# Patient Record
Sex: Female | Born: 1935
Health system: Southern US, Community
[De-identification: ages and names within clinical notes are randomized; demographics above are authoritative.]

## PROBLEM LIST (undated history)

## (undated) DIAGNOSIS — F329 Major depressive disorder, single episode, unspecified: Secondary | ICD-10-CM

## (undated) DIAGNOSIS — M810 Age-related osteoporosis without current pathological fracture: Secondary | ICD-10-CM

## (undated) DIAGNOSIS — K219 Gastro-esophageal reflux disease without esophagitis: Secondary | ICD-10-CM

## (undated) DIAGNOSIS — I10 Essential (primary) hypertension: Secondary | ICD-10-CM

## (undated) DIAGNOSIS — I499 Cardiac arrhythmia, unspecified: Secondary | ICD-10-CM

## (undated) DIAGNOSIS — M1990 Unspecified osteoarthritis, unspecified site: Secondary | ICD-10-CM

## (undated) DIAGNOSIS — F32A Depression, unspecified: Secondary | ICD-10-CM

## (undated) DIAGNOSIS — K5790 Diverticulosis of intestine, part unspecified, without perforation or abscess without bleeding: Secondary | ICD-10-CM

## (undated) DIAGNOSIS — E785 Hyperlipidemia, unspecified: Secondary | ICD-10-CM

## (undated) DIAGNOSIS — I350 Nonrheumatic aortic (valve) stenosis: Secondary | ICD-10-CM

## (undated) DIAGNOSIS — J449 Chronic obstructive pulmonary disease, unspecified: Secondary | ICD-10-CM

## (undated) DIAGNOSIS — R011 Cardiac murmur, unspecified: Secondary | ICD-10-CM

## (undated) HISTORY — DX: Gastro-esophageal reflux disease without esophagitis: K21.9

## (undated) HISTORY — DX: Chronic obstructive pulmonary disease, unspecified: J44.9

## (undated) HISTORY — DX: Depression, unspecified: F32.A

## (undated) HISTORY — PX: CATARACT EXTRACTION: SUR2

## (undated) HISTORY — DX: Hyperlipidemia, unspecified: E78.5

## (undated) HISTORY — PX: APPENDECTOMY: SHX54

## (undated) HISTORY — DX: Major depressive disorder, single episode, unspecified: F32.9

## (undated) HISTORY — PX: ABDOMINAL HYSTERECTOMY: SHX81

## (undated) HISTORY — DX: Diverticulosis of intestine, part unspecified, without perforation or abscess without bleeding: K57.90

## (undated) HISTORY — DX: Essential (primary) hypertension: I10

---
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
## 1998-09-09 ENCOUNTER — Other Ambulatory Visit: Admission: RE | Admit: 1998-09-09 | Discharge: 1998-09-09 | Payer: Self-pay | Admitting: *Deleted

## 1999-09-19 ENCOUNTER — Other Ambulatory Visit: Admission: RE | Admit: 1999-09-19 | Discharge: 1999-09-19 | Payer: Self-pay | Admitting: *Deleted

## 2000-05-09 ENCOUNTER — Encounter: Payer: Self-pay | Admitting: Gastroenterology

## 2007-11-05 ENCOUNTER — Ambulatory Visit: Payer: Self-pay | Admitting: Gastroenterology

## 2007-11-05 DIAGNOSIS — R1033 Periumbilical pain: Secondary | ICD-10-CM | POA: Insufficient documentation

## 2007-11-05 LAB — CONVERTED CEMR LAB
AST: 18 units/L (ref 0–37)
Albumin: 3.9 g/dL (ref 3.5–5.2)
Alkaline Phosphatase: 70 units/L (ref 39–117)
BUN: 18 mg/dL (ref 6–23)
Basophils Relative: 1.2 % — ABNORMAL HIGH (ref 0.0–1.0)
Creatinine, Ser: 0.8 mg/dL (ref 0.4–1.2)
GFR calc Af Amer: 90 mL/min
GFR calc non Af Amer: 75 mL/min
Hemoglobin: 13.5 g/dL (ref 12.0–15.0)
Monocytes Absolute: 0.6 10*3/uL (ref 0.2–0.7)
Monocytes Relative: 7.4 % (ref 3.0–11.0)
RDW: 13 % (ref 11.5–14.6)
Total Bilirubin: 0.9 mg/dL (ref 0.3–1.2)
Total Protein: 6.7 g/dL (ref 6.0–8.3)

## 2008-11-24 ENCOUNTER — Encounter: Admission: RE | Admit: 2008-11-24 | Discharge: 2009-01-06 | Payer: Self-pay | Admitting: Family Medicine

## 2010-05-18 ENCOUNTER — Encounter: Payer: Self-pay | Admitting: Gastroenterology

## 2010-09-22 ENCOUNTER — Encounter (INDEPENDENT_AMBULATORY_CARE_PROVIDER_SITE_OTHER): Payer: Self-pay | Admitting: *Deleted

## 2010-09-29 NOTE — Letter (Signed)
Summary: New Patient letter  [facility]  [address]   [place], [place] [address]   Phone: [phone]  Fax: [phone]       09/22/2010 MRN: [id number]  Robin [name] [address] [place], [place]  [address]  Dear Ms. MYERS,  Welcome to the Gastroenterology Division at [facility].    You are scheduled to see Dr.  Hennie Duos on 10/28/2010 at 3:00 on the 3rd floor at [facility], [address]  We ask that you try to arrive at our office 15 minutes prior to your appointment time to allow for check-in.  We would like you to complete the enclosed self-administered evaluation form prior to your visit and bring it with you on the day of your appointment.  We will review it with you.  Also, please bring a complete list of all your medications or, if you prefer, bring the medication bottles and we will list them.  Please bring your insurance card so that we may make a copy of it.  If your insurance requires a referral to see a specialist, please bring your referral form from your primary care physician.  Co-payments are due at the time of your visit and may be paid by cash, check or credit card.     Your office visit will consist of a consult with your physician (includes a physical exam), any laboratory testing he/she may order, scheduling of any necessary diagnostic testing (e.g. x-ray, ultrasound, CT-scan), and scheduling of a procedure (e.g. Endoscopy, Colonoscopy) if required.  Please allow enough time on your schedule to allow for any/all of these possibilities.    If you cannot keep your appointment, please call [phone] to cancel or reschedule prior to your appointment date.  This allows [place] the opportunity to schedule an appointment for another patient in need of care.  If you do not cancel or reschedule by 5 p.m. the business day prior to your appointment date, you will be charged a $50.00 late cancellation/no-show fee.    Thank you for choosing  [facility] for your medical needs.  We appreciate the opportunity to care for you.  Please visit [place] at our website  to learn more about our practice.                     Sincerely,                                                             The Gastroenterology Division

## 2010-09-29 NOTE — Letter (Signed)
Summary: Colonoscopy Letter  [facility]  [address] [place], [place] [address]   Phone: [phone]  Fax: [phone]      May 18, 2010 MRN: [id number]   Robin [name] [address] [place], [place]  [address]   Dear Robin [name],   According to your medical record, it is time for you to schedule a Colonoscopy. The American Cancer Society recommends this procedure as a method to detect early colon cancer. Patients with a family history of colon cancer, or a personal history of colon polyps or inflammatory bowel disease are at increased risk.  This letter has beeen generated based on the recommendations made at the time of your procedure. If you feel that in your particular situation this may no longer apply, please contact our office.  Please call our office at [phone] to schedule this appointment or to update your records at your earliest convenience.  Thank you for cooperating with [place] to provide you with the very best care possible.   Sincerely,  Barbette Hair. Arlyce Dice, M.D.  [facility] Division [phone]

## 2010-10-28 ENCOUNTER — Encounter: Payer: Self-pay | Admitting: Gastroenterology

## 2010-10-28 ENCOUNTER — Ambulatory Visit (INDEPENDENT_AMBULATORY_CARE_PROVIDER_SITE_OTHER): Payer: Medicare Other | Admitting: Gastroenterology

## 2010-10-28 DIAGNOSIS — R197 Diarrhea, unspecified: Secondary | ICD-10-CM

## 2010-10-28 DIAGNOSIS — R194 Change in bowel habit: Secondary | ICD-10-CM | POA: Insufficient documentation

## 2010-10-28 DIAGNOSIS — R198 Other specified symptoms and signs involving the digestive system and abdomen: Secondary | ICD-10-CM

## 2010-10-28 DIAGNOSIS — Z8719 Personal history of other diseases of the digestive system: Secondary | ICD-10-CM | POA: Insufficient documentation

## 2010-10-31 ENCOUNTER — Other Ambulatory Visit: Payer: Medicare Other | Admitting: Gastroenterology

## 2010-10-31 ENCOUNTER — Ambulatory Visit (HOSPITAL_COMMUNITY)
Admission: RE | Admit: 2010-10-31 | Discharge: 2010-10-31 | Disposition: A | Discharge status: Home / Self Care | Payer: Medicare Other | Source: Ambulatory Visit | Attending: Gastroenterology | Admitting: Gastroenterology

## 2010-10-31 ENCOUNTER — Encounter: Payer: Self-pay | Admitting: Gastroenterology

## 2010-10-31 DIAGNOSIS — K648 Other hemorrhoids: Secondary | ICD-10-CM

## 2010-10-31 DIAGNOSIS — K5730 Diverticulosis of large intestine without perforation or abscess without bleeding: Secondary | ICD-10-CM | POA: Insufficient documentation

## 2010-10-31 DIAGNOSIS — R198 Other specified symptoms and signs involving the digestive system and abdomen: Secondary | ICD-10-CM

## 2010-11-03 NOTE — Letter (Signed)
Summary: [facility] Instructions  [facility]  [address] [place], [place] [address]   Phone: [phone]  Fax: [phone]       Robin [name]    1932/06/21    MRN: [id number]        Procedure Day /Date:MONDAY 10/31/2010     Arrival Time:8:30AM     Procedure Time:9:30PM     Location of Procedure:                     X   [facility] ( Outpatient Registration)                        PREPARATION FOR COLONOSCOPY WITH MOVIPREP   Starting 5 days prior to your procedure3/08/2010 do not eat nuts, seeds, popcorn, corn, beans, peas,  salads, or any raw vegetables.  Do not take any fiber supplements (e.g. Metamucil, Citrucel, and Benefiber).  THE DAY BEFORE YOUR PROCEDURE         DATE: 10/30/2010  DAY: SUNDAY  1.  Drink clear liquids the entire day-NO SOLID FOOD  2.  Do not drink anything colored red or purple.  Avoid juices with pulp.  No orange juice.  3.  Drink at least 64 oz. (8 glasses) of fluid/clear liquids during the day to prevent dehydration and help the prep work efficiently.  CLEAR LIQUIDS INCLUDE: Water Jello Ice Popsicles Tea (sugar ok, no milk/cream) Powdered fruit flavored drinks Coffee (sugar ok, no milk/cream) Gatorade Juice: apple, white grape, white cranberry  Lemonade Clear bullion, consomm, broth Carbonated beverages (any kind) Strained chicken noodle soup Hard Candy                             4.  In the morning, mix first dose of MoviPrep solution:    Empty 1 Pouch A and 1 Pouch B into the disposable container    Add lukewarm drinking water to the top line of the container. Mix to dissolve    Refrigerate (mixed solution should be used within 24 hrs)  5.  Begin drinking the prep at 5:00 p.m. The MoviPrep container is divided by 4 marks.   Every 15 minutes drink the solution down to the next mark (approximately 8 oz) until the full liter is complete.   6.  Follow completed prep with 16 oz of clear liquid of your choice  (Nothing red or purple).  Continue to drink clear liquids until bedtime.  7.  Before going to bed, mix second dose of MoviPrep solution:    Empty 1 Pouch A and 1 Pouch B into the disposable container    Add lukewarm drinking water to the top line of the container. Mix to dissolve    Refrigerate  THE DAY OF YOUR PROCEDURE      DATE: 10/31/2010 DAY: MONDAY  Beginning at 4:30AM _a.m. (5 hours before procedure):         1. Every 15 minutes, drink the solution down to the next mark (approx 8 oz) until the full liter is complete.  2. Follow completed prep with 16 oz. of clear liquid of your choice.    3. You may drink clear liquids until 5:30AM 4  HOURS BEFORE PROCEDURE).   MEDICATION INSTRUCTIONS  Unless otherwise instructed, you should take regular prescription medications with a small sip of water   as early as possible the morning of your procedure.  OTHER INSTRUCTIONS  You will need a responsible adult at least 75 years of age to accompany you and drive you home.   This person must remain in the waiting room during your procedure.  Wear loose fitting clothing that is easily removed.  Leave jewelry and other valuables at home.  However, you may wish to bring a book to read or  an iPod/MP3 player to listen to music as you wait for your procedure to start.  Remove all body piercing jewelry and leave at home.  Total time from sign-in until discharge is approximately 2-3 hours.  You should go home directly after your procedure and rest.  You can resume normal activities the  day after your procedure.  The day of your procedure you should not:   Drive   Make legal decisions   Operate machinery   Drink alcohol   Return to work  You will receive specific instructions about eating, activities and medications before you leave.    The above instructions have been reviewed and explained to me by   _______________________    I fully understand and can  verbalize these instructions _____________________________ Date _________

## 2010-11-03 NOTE — Procedures (Signed)
Summary: Colonoscopy   Colonoscopy  Procedure date:  05/09/2000  Findings:      Results: Diverticulosis.       Location:  [facility].    Comments:      Repeat colonoscopy in 10 years.    Colonoscopy  Procedure date:  05/09/2000  Findings:      Results: Diverticulosis.       Location:  [facility].    Comments:      Repeat colonoscopy in 10 years.   Patient Name: Robin, [name] MRN: [id number] Procedure Procedures: Colorectal cancer screening, average risk CPT: G0121.  Personnel: Endoscopist: Barbette Hair. Arlyce Dice, MD.  Referred By: Al Decant Janey Greaser, MD.  Exam Location: Exam performed in [facility]. Outpatient  Patient Consent: Procedure, Alternatives, Risks and Benefits discussed, consent obtained, from patient.  Indications  Average Risk Screening Routine.  History  Pre-Exam Physical: Performed May 09, 2000. Cardio-pulmonary exam, Rectal exam, HEENT exam , Abdominal exam, Extremity exam, Neurological exam WNL.  Exam Exam: Extent of exam reached: Cecum, extent intended: Cecum.  Patient position: on left side. Colon retroflexion performed. ASA Classification: I. Tolerance: excellent.  Monitoring: Pulse and BP monitoring, Oximetry used. Supplemental O2 given.  Colon Prep Used Golytely for colon prep. Prep results: excellent.  Fluoroscopy: Fluoroscopy was not used.  Sedation Meds: Versed 10 mg. Fentanyl 100 mcg.  Findings - DIVERTICULOSIS: Descending Colon to Sigmoid Colon. Not bleeding.   Assessment Abnormal examination, see findings above.  Diagnoses: 562.10: Diverticulosis.   Events  Unplanned Interventions: No intervention was required.  Unplanned Events: There were no complications. Plans Patient Education: Patient given standard instructions for: Diverticulosis.  Disposition: After procedure patient sent to recovery. After recovery patient sent home.  Scheduling/Referral: Colonoscopy, to Barbette Hair. Arlyce Dice, MD, around  May 09, 2010.    This report was created from the original endoscopy report, which was reviewed and signed by the above listed endoscopist.

## 2010-11-08 NOTE — Miscellaneous (Signed)
  Clinical Lists Changes  Medications: Added new medication of ANUSOL-HC 25 MG  SUPP (HYDROCORTISONE ACETATE) 1 pr qhs - Signed Rx of ANUSOL-HC 25 MG  SUPP (HYDROCORTISONE ACETATE) 1 pr qhs;  #7 x 1;  Signed;  Entered by: Louis Meckel MD;  Authorized by: Louis Meckel MD;  Method used: Electronically to [facility] [address] [place], [place], [place]  [address], Ph: [phone], Fax: [phone]    Prescriptions: ANUSOL-HC 25 MG  SUPP (HYDROCORTISONE ACETATE) 1 pr qhs  #7 x 1   Entered and Authorized by:   Louis Meckel MD   Signed by:   Louis Meckel MD on 10/31/2010   Method used:   Electronically to        [facility]       [address]       [place], [place]  [address]       Ph: [phone]       Fax: [phone]   RxID:   [id number]

## 2010-11-08 NOTE — Letter (Signed)
Summary: Appt Reminder 2  [facility]  [address] [place], [place] [address]   Phone: [phone]  Fax: [phone]        October 31, 2010 MRN: [id number]    Robin [name] [address] [place], [place]  [address]    Dear Ms. MYERS,   You have a return appointment with Dr. Arlyce Dice on 12/07/10 at 2:15pm.  Please remember to bring a complete list of the medicines you are taking, your insurance card and your co-pay.  If you have to cancel or reschedule this appointment, please call before 5:00 pm the evening before to avoid a cancellation fee.  If you have any questions or concerns, please call [phone].    Sincerely,    Selinda Michaels RN  Appended Document: Appt Reminder 2 Letter is mailed to the patient's home address

## 2010-11-08 NOTE — Assessment & Plan Note (Signed)
Summary: CONSULT FOR COLON AT PT REQUEST.[name] POL ADVISED//MEDLIST/...   History of Present Illness Visit Type: Initial Visit Primary GI MD: Melvia Heaps MD [facility] Primary Provider: Sigmund Hazel, MD Chief Complaint: Patient complains of change in bowels, she also complain of fecal incontience. She also recieved a letter about her colonoscopy recall.  History of Present Illness:   [name]  is a pleasant, 77 year old white female referred at the request of Dr. Hyacinth Meeker for evaluation of change in bowel habits. For several months she  has developed poorly formed stools accompanied by severe urgency and occasional incontinence. She has some rectal discomfort due to hemorrhoids. She denies change in medications or her diet. She has been on no antibiotics. Last colonoscopy in 2001 demonstrated diverticulosis.  She may have  several of loose stools daily.   GI Review of Systems      Denies abdominal pain, acid reflux, belching, bloating, chest pain, dysphagia with liquids, dysphagia with solids, heartburn, loss of appetite, nausea, vomiting, vomiting blood, weight loss, and  weight gain.      Reports change in bowel habits, fecal incontinence, and  hemorrhoids.     Denies anal fissure, black tarry stools, constipation, diarrhea, diverticulosis, heme positive stool, irritable bowel syndrome, jaundice, light color stool, liver problems, rectal bleeding, and  rectal pain. Preventive Screening-Counseling & Management  Alcohol-Tobacco     Smoking Status: quit      Drug Use:  no.      Current Medications (verified): 1)  Sertraline Hcl 100 Mg Tabs (Sertraline Hcl) .... Take One By Mouth Once Daily 2)  Diovan Hct 320-12.5 Mg Tabs (Valsartan-Hydrochlorothiazide) .... Take One By Mouth Once Daily 3)  Spiriva Handihaler 18 Mcg Caps (Tiotropium Bromide Monohydrate) .... Use Once A Day 4)  Lovastatin 40 Mg Tabs (Lovastatin) .... Take One By Mouth Once Daily  Allergies (verified): 1)  ! Sulfa  Past  History:  Past Medical History: GERD Constipation Depression Diverticulosis TIA Hyperlipidemia Hypertension  Past Surgical History: Reviewed history from 10/24/2010 and no changes required. Appendectomy Hysterectomy  Family History: Sister had biliary cancer family history of lung cancer: father (smoker) No FH of Colon Cancer:  Social History: Occupation: retired married one son Patient is a former smoker.  Alcohol Use - yes 2 per day Daily Caffeine Use 3 per day Illicit Drug Use - no Smoking Status:  quit Drug Use:  no  Review of Systems       The patient complains of allergy/sinus and urine leakage.  The patient denies anemia, anxiety-new, arthritis/joint pain, back pain, blood in urine, breast changes/lumps, change in vision, confusion, cough, coughing up blood, depression-new, fainting, fatigue, fever, headaches-new, hearing problems, heart murmur, heart rhythm changes, itching, menstrual pain, muscle pains/cramps, night sweats, nosebleeds, pregnancy symptoms, shortness of breath, skin rash, sleeping problems, sore throat, swelling of feet/legs, swollen lymph glands, thirst - excessive , urination - excessive , urination changes/pain, vision changes, and voice change.         All other systems were reviewed and were negative   Vital Signs:  Patient profile:   77 year old female Height:      63 inches Weight:      157 pounds BMI:     27.91 Pulse rate:   76 / minute Pulse rhythm:   regular BP sitting:   138 / 62  (left arm) Cuff size:   regular  Vitals Entered By: Harlow Mares CMA Duncan Dull) (October 28, 2010 3:09 PM)  Physical Exam  Additional Exam:  Physical Exam: General:   WDWN HEENT:   anicteric.  No pharyngeal abnormalities Neck:   No masses, thyroidmegaly Nodes:   No cervical, axillary, inguinal adenopathy Chest:    Clear to auscultation Cardiac:   No murmurs, gallops, rubs Abdomen:   BS active.  No abd masses, tenderness, organomegaly Rectal:    Deferred Extremities:   No cyanosis, clubbing, edema Skeletal:   No deformities Neuro:   Alert, oriented x3.  No focal abnormalities     Impression & Recommendations:  Problem # 1:  CHANGE IN BOWELS (ICD-787.99)   There is no obvious etiology for her symptoms. A structural abnormality of the colon should be ruled out.   Recommendations #1 colonoscopy. #2 fiber supplementation if colonoscopy is not diagnostic for an abnormality  Orders: ZCOL (ZCOL)  Patient Instructions: 1)  Copy sent to :Sigmund Hazel, MD  2)  Your Colonoscopy is scheduled for Monday 10/31/2010 at 9:30pm 3)  You can pick up your MoviPrep at your pharmacy  4)  Colonoscopy and Flexible Sigmoidoscopy brochure given.  5)  Conscious Sedation brochure given.  6)  The medication list was reviewed and reconciled.  All changed / newly prescribed medications were explained.  A complete medication list was provided to the patient / caregiver. Prescriptions: MOVIPREP 100 GM  SOLR (PEG-KCL-NACL-NASULF-NA ASC-C) As per prep instructions.  #1 x 0   Entered by:   Merri Ray CMA (AAMA)   Authorized by:   Louis Meckel MD   Signed by:   Merri Ray CMA (AAMA) on 10/28/2010   Method used:   Electronically to        [facility]       [address]       [place], [place]  [address]       Ph: [phone]       Fax: [phone]   RxID:   [id number]

## 2010-11-08 NOTE — Procedures (Signed)
Summary: Colonoscopy  Patient: [name] Note: All result statuses are Final unless otherwise noted.  Tests: (1) Colonoscopy (COL)   COL Colonoscopy           DONE     [facility]     [address] [place], [place]  [address]          COLONOSCOPY PROCEDURE REPORT          PATIENT:  Robin [name], Robin [name]  MR#:  [id number]     BIRTHDATE:  February 05, 1932, 78 yrs. old  GENDER:  female     ENDOSCOPIST:  Barbette Hair. Arlyce Dice, MD     REF. BY:  Sigmund Hazel, M.D.     PROCEDURE DATE:  10/31/2010     PROCEDURE:  Diagnostic Colonoscopy     ASA CLASS:  Class II     INDICATIONS:  change in bowel habits     MEDICATIONS:   Fentanyl 75 mcg IV, Versed 7.5 mg IV, Benadryl 25     mg          DESCRIPTION OF PROCEDURE:   After the risks benefits and     alternatives of the procedure were thoroughly explained, informed     consent was obtained.  Digital rectal exam was performed and     revealed no abnormalities.   The Pentax Colonoscope [id number]     endoscope was introduced through the anus and advanced to the     cecum, which was identified by the ileocecal valve, without     limitations.  The quality of the prep was excellent, using     MoviPrep.  The instrument was then slowly withdrawn as the colon     was fully examined.     <<PROCEDUREIMAGES>>          FINDINGS:  Moderate diverticulosis was found in the sigmoid colon     (see image9).  Internal Hemorrhoids were found (see image11).     This was otherwise a normal examination of the colon (see image2,     image3, image4, image5, image7, and image10).   Retroflexed views     in the rectum revealed no abnormalities.    The scope was then     withdrawn from the patient and the procedure completed.          COMPLICATIONS:  None     ENDOSCOPIC IMPRESSION:     1) Moderate diverticulosis in the sigmoid colon     2) Internal hemorrhoids     3) Otherwise normal examination     RECOMMENDATIONS:     1) high fiber diet     2) Anusol HC  supp. prn     3) call office next 1-3 days to schedule followup visit in 1     month     REPEAT EXAM:  No          ______________________________     Barbette Hair. Arlyce Dice, MD          CC:          n.     eSIGNED:   Barbette Hair. Pietro Bonura at 10/31/2010 10:55 AM          Walker Shadow, [id number]  Note: An exclamation mark (!) indicates a result that was not dispersed into the flowsheet. Document Creation Date: 10/31/2010 10:56 AM _______________________________________________________________________  (1) Order result status: Final Collection or observation date-time: 10/31/2010 10:50 Requested date-time:  Receipt date-time:  Reported date-time:  Referring Physician:  Ordering Physician: Melvia Heaps [phone]) Specimen Source:  Source: Launa Grill Order Number: [id number] Lab site:

## 2010-11-08 NOTE — Procedures (Signed)
Summary: Instructions for procedure/[place]  Instructions for procedure/[place]   Imported By: Sherian Rein 11/02/2010 14:44:38  _____________________________________________________________________  External Attachment:    Type:   Image     Comment:   External Document

## 2010-12-07 ENCOUNTER — Encounter: Payer: Self-pay | Admitting: Gastroenterology

## 2010-12-07 ENCOUNTER — Ambulatory Visit (INDEPENDENT_AMBULATORY_CARE_PROVIDER_SITE_OTHER): Payer: Medicare Other | Admitting: Gastroenterology

## 2010-12-07 VITALS — BP 152/76 | HR 80 | Ht 63.0 in | Wt 156.6 lb

## 2010-12-07 DIAGNOSIS — R198 Other specified symptoms and signs involving the digestive system and abdomen: Secondary | ICD-10-CM

## 2010-12-07 NOTE — Progress Notes (Signed)
  [name] has returned following colonoscopy for change in bowel habits. She had been complaining of diarrhea with episodes of incontinence. She is also having hemorrhoidal discomfort. Colonoscopy demonstrated diverticulosis and internal hemorrhoids. Since her procedure diarrhea has significantly improved. Bowel movements are now solid and she no longer has urgency. Since her last visit she took a Z-Pak for a sinus infection.

## 2010-12-07 NOTE — Assessment & Plan Note (Addendum)
Diarrhea is clearly improved for  unknown reasons. She'll continue fiber supplementation. Return as needed.

## 2011-01-10 NOTE — Assessment & Plan Note (Signed)
[facility]                         GASTROENTEROLOGY OFFICE NOTE   Robin [name]                      MRN:          [id number]  DATE:11/05/2007                            DOB:          1932/02/27    PROBLEM:  Abdominal pain.   [name] is a pleasant, 77 year old white female, here for evaluation  of above.  In the last two months, she has had two episodes of severe  lower abdominal pain.  Pain was described as sharp and somewhat crampy.  First episode lasted several hours.  Second episode lasted six to eight  hours.  She has been suffering from mild constipation, having a bowel  movement irregularly every few days.  There is no history of melena or  hematochezia.  Weight has been stable.  She also complains of occasional  pyrosis, especially at night, for which she takes Pepcid.  She denies  dysphagia.   PAST MEDICAL HISTORY:  Pertinent for hypertension.  She had a TIA over  20 years ago.  She has arthritis and depression.  She is status post  hysterectomy.   FAMILY HISTORY:  Noncontributory.   MEDICATIONS:  Include Spiriva, Avalide, baby aspirin, sertraline,  verapamil, lovastatin and alendronate.   She is ALLERGIC TO SULFA AND AUGMENTIN.   She smokes.  She drinks occasionally.  She is widowed and retired.   REVIEW OF SYSTEMS:  Positive for joint pains and some shortness of  breath.   PHYSICAL EXAMINATION:  Pulse 80, blood pressure 118/60, weight 159.  HEENT: EOMI.  PERRLA.  Sclerae are anicteric.  Conjunctivae are pink.  NECK:  Supple without thyromegaly, adenopathy or carotid bruits.  CHEST:  Clear to auscultation and percussion without adventitious  sounds.  CARDIAC:  Regular rhythm; normal S1 S2.  There are no murmurs, gallops  or rubs.  ABDOMEN:  Bowel sounds are normoactive.  Abdomen is soft, nontender and  nondistended.  There are no abdominal masses, tenderness, splenic  enlargement or hepatomegaly.  EXTREMITIES:  Full  range of motion.  No cyanosis, clubbing or edema.  RECTAL:  Stool is heme-negative.   IMPRESSION:  1. Nonspecific lower abdominal pain.  There is nothing on exam to      suggest intra-abdominal process.  Pain may be related to her mild      constipation.  2. Probable functional constipation.   RECOMMENDATION:  1. Check CBC and CMET.  2. Fiber supplementation.  3. I carefully instructed Robin [name] to contact me if symptoms recur      or constipation is worsening, at which point I would schedule      colonoscopy.     Barbette Hair. Arlyce Dice, MD,FACG  Electronically Signed    RDK/MedQ  DD: 11/05/2007  DT: 11/05/2007  Job #: [id number]   cc:   Al Decant. Janey Greaser, MD

## 2011-10-25 ENCOUNTER — Other Ambulatory Visit (HOSPITAL_COMMUNITY): Payer: Self-pay | Admitting: *Deleted

## 2011-10-27 ENCOUNTER — Encounter (HOSPITAL_COMMUNITY): Payer: Self-pay

## 2011-10-27 ENCOUNTER — Ambulatory Visit (HOSPITAL_COMMUNITY)
Admission: RE | Admit: 2011-10-27 | Discharge: 2011-10-27 | Disposition: A | Discharge status: Home / Self Care | Payer: Medicare Other | Source: Ambulatory Visit | Attending: Family Medicine | Admitting: Family Medicine

## 2011-10-27 DIAGNOSIS — M810 Age-related osteoporosis without current pathological fracture: Secondary | ICD-10-CM | POA: Insufficient documentation

## 2011-10-27 MED ORDER — SODIUM CHLORIDE 0.9 % IV SOLN
Freq: Once | INTRAVENOUS | Status: AC
  Administered 2011-10-27: 09:00:00 via INTRAVENOUS

## 2011-10-27 MED ORDER — ZOLEDRONIC ACID 5 MG/100ML IV SOLN
5.0000 mg | Freq: Once | INTRAVENOUS | Status: AC
  Administered 2011-10-27: 5 mg via INTRAVENOUS
  Filled 2011-10-27: qty 100

## 2011-10-27 NOTE — Discharge Instructions (Signed)
Drink  Fluids/water as tolerated over the next 72 hours Tylenol or ibuprofen OTC as directed If any problems or questions call Dr. Hyacinth Meeker Check with Dr. Hyacinth Meeker at your appointment next week about taking calcium/vitamin D supplements. Take as directed by your MD.    Emilio Math Zoledronic Acid injection (Paget's Disease, Osteoporosis) What is this medicine? ZOLEDRONIC ACID (ZOE le dron ik AS id) lowers the amount of calcium loss from bone. It is used to treat Paget's disease and osteoporosis in women. This medicine may be used for other purposes; ask your health care provider or pharmacist if you have questions. What should I tell my health care provider before I take this medicine? They need to know if you have any of these conditions: -aspirin-sensitive asthma -dental disease -kidney disease -low levels of calcium in the blood -past surgery on the parathyroid gland or intestines -an unusual or allergic reaction to zoledronic acid, other medicines, foods, dyes, or preservatives -pregnant or trying to get pregnant -breast-feeding How should I use this medicine? This medicine is for infusion into a vein. It is given by a health care professional in a hospital or clinic setting. Talk to your pediatrician regarding the use of this medicine in children. This medicine is not approved for use in children. Overdosage: If you think you have taken too much of this medicine contact a poison control center or emergency room at once. NOTE: This medicine is only for you. Do not share this medicine with others. What if I miss a dose? It is important not to miss your dose. Call your doctor or health care professional if you are unable to keep an appointment. What may interact with this medicine? -certain antibiotics given by injection -NSAIDs, medicines for pain and inflammation, like ibuprofen or naproxen -some diuretics like bumetanide, furosemide -teriparatide This list may not describe all  possible interactions. Give your health care provider a list of all the medicines, herbs, non-prescription drugs, or dietary supplements you use. Also tell them if you smoke, drink alcohol, or use illegal drugs. Some items may interact with your medicine. What should I watch for while using this medicine? Visit your doctor or health care professional for regular checkups. It may be some time before you see the benefit from this medicine. Do not stop taking your medicine unless your doctor tells you to. Your doctor may order blood tests or other tests to see how you are doing. Women should inform their doctor if they wish to become pregnant or think they might be pregnant. There is a potential for serious side effects to an unborn child. Talk to your health care professional or pharmacist for more information. You should make sure that you get enough calcium and vitamin D while you are taking this medicine. Discuss the foods you eat and the vitamins you take with your health care professional. Some people who take this medicine have severe bone, joint, and/or muscle pain. This medicine may also increase your risk for a broken thigh bone. Tell your doctor right away if you have pain in your upper leg or groin. Tell your doctor if you have any pain that does not go away or that gets worse. What side effects may I notice from receiving this medicine? Side effects that you should report to your doctor or health care professional as soon as possible: -allergic reactions like skin rash, itching or hives, swelling of the face, lips, or tongue -breathing problems -changes in vision -feeling faint or lightheaded, falls -jaw  burning, cramping, or pain -muscle cramps, stiffness, or weakness -trouble passing urine or change in the amount of urine Side effects that usually do not require medical attention (report to your doctor or health care professional if they continue or are bothersome): -bone, joint, or  muscle pain -fever -irritation at site where injected -loss of appetite -nausea, vomiting -stomach upset -tired This list may not describe all possible side effects. Call your doctor for medical advice about side effects. You may report side effects to FDA at 1-800-FDA-1088. Where should I keep my medicine? This drug is given in a hospital or clinic and will not be stored at home. NOTE: This sheet is a summary. It may not cover all possible information. If you have questions about this medicine, talk to your doctor, pharmacist, or health care provider.  2012, Elsevier/[employer]. (02/10/2011 9:08:15 AM)

## 2012-08-28 DIAGNOSIS — I499 Cardiac arrhythmia, unspecified: Secondary | ICD-10-CM

## 2012-08-28 HISTORY — DX: Cardiac arrhythmia, unspecified: I49.9

## 2012-11-14 ENCOUNTER — Other Ambulatory Visit (HOSPITAL_COMMUNITY): Payer: Self-pay | Admitting: Family Medicine

## 2012-11-18 ENCOUNTER — Ambulatory Visit: Payer: Medicare Other | Attending: Family Medicine | Admitting: Physical Therapy

## 2012-11-18 DIAGNOSIS — M545 Low back pain, unspecified: Secondary | ICD-10-CM | POA: Insufficient documentation

## 2012-11-18 DIAGNOSIS — IMO0001 Reserved for inherently not codable concepts without codable children: Secondary | ICD-10-CM | POA: Insufficient documentation

## 2012-11-18 DIAGNOSIS — M25519 Pain in unspecified shoulder: Secondary | ICD-10-CM | POA: Insufficient documentation

## 2012-11-19 ENCOUNTER — Encounter (HOSPITAL_COMMUNITY): Payer: Self-pay

## 2012-11-19 ENCOUNTER — Ambulatory Visit (HOSPITAL_COMMUNITY)
Admission: RE | Admit: 2012-11-19 | Discharge: 2012-11-19 | Disposition: A | Discharge status: Home / Self Care | Payer: Medicare Other | Source: Ambulatory Visit | Attending: Family Medicine | Admitting: Family Medicine

## 2012-11-19 DIAGNOSIS — M810 Age-related osteoporosis without current pathological fracture: Secondary | ICD-10-CM | POA: Insufficient documentation

## 2012-11-19 HISTORY — DX: Cardiac arrhythmia, unspecified: I49.9

## 2012-11-19 MED ORDER — SODIUM CHLORIDE 0.9 % IV SOLN
Freq: Once | INTRAVENOUS | Status: AC
  Administered 2012-11-19: 13:00:00 via INTRAVENOUS

## 2012-11-19 MED ORDER — ZOLEDRONIC ACID 5 MG/100ML IV SOLN
5.0000 mg | Freq: Once | INTRAVENOUS | Status: AC
  Administered 2012-11-19: 5 mg via INTRAVENOUS
  Filled 2012-11-19: qty 100

## 2012-11-19 NOTE — Discharge Instructions (Signed)
Zoledronic Acid injection (Paget's Disease, Osteoporosis) What is this medicine? ZOLEDRONIC ACID (ZOE le dron ik AS id) lowers the amount of calcium loss from bone. It is used to treat Paget's disease and osteoporosis in women. This medicine may be used for other purposes; ask your health care provider or pharmacist if you have questions. What should I tell my health care provider before I take this medicine? They need to know if you have any of these conditions: -aspirin-sensitive asthma -dental disease -kidney disease -low levels of calcium in the blood -past surgery on the parathyroid gland or intestines -an unusual or allergic reaction to zoledronic acid, other medicines, foods, dyes, or preservatives -pregnant or trying to get pregnant -breast-feeding How should I use this medicine? This medicine is for infusion into a vein. It is given by a health care professional in a hospital or clinic setting. Talk to your pediatrician regarding the use of this medicine in children. This medicine is not approved for use in children. Overdosage: If you think you have taken too much of this medicine contact a poison control center or emergency room at once. NOTE: This medicine is only for you. Do not share this medicine with others. What if I miss a dose? It is important not to miss your dose. Call your doctor or health care professional if you are unable to keep an appointment. What may interact with this medicine? -certain antibiotics given by injection -NSAIDs, medicines for pain and inflammation, like ibuprofen or naproxen -some diuretics like bumetanide, furosemide -teriparatide This list may not describe all possible interactions. Give your health care provider a list of all the medicines, herbs, non-prescription drugs, or dietary supplements you use. Also tell them if you smoke, drink alcohol, or use illegal drugs. Some items may interact with your medicine. What should I watch for while  using this medicine? Visit your doctor or health care professional for regular checkups. It may be some time before you see the benefit from this medicine. Do not stop taking your medicine unless your doctor tells you to. Your doctor may order blood tests or other tests to see how you are doing. Women should inform their doctor if they wish to become pregnant or think they might be pregnant. There is a potential for serious side effects to an unborn child. Talk to your health care professional or pharmacist for more information. You should make sure that you get enough calcium and vitamin D while you are taking this medicine. Discuss the foods you eat and the vitamins you take with your health care professional. Some people who take this medicine have severe bone, joint, and/or muscle pain. This medicine may also increase your risk for a broken thigh bone. Tell your doctor right away if you have pain in your upper leg or groin. Tell your doctor if you have any pain that does not go away or that gets worse. What side effects may I notice from receiving this medicine? Side effects that you should report to your doctor or health care professional as soon as possible: -allergic reactions like skin rash, itching or hives, swelling of the face, lips, or tongue -breathing problems -changes in vision -feeling faint or lightheaded, falls -jaw burning, cramping, or pain -muscle cramps, stiffness, or weakness -trouble passing urine or change in the amount of urine Side effects that usually do not require medical attention (report to your doctor or health care professional if they continue or are bothersome): -bone, joint, or muscle pain -fever -  irritation at site where injected -loss of appetite -nausea, vomiting -stomach upset -tired This list may not describe all possible side effects. Call your doctor for medical advice about side effects. You may report side effects to FDA at 1-800-FDA-1088. Where  should I keep my medicine? This drug is given in a hospital or clinic and will not be stored at home. NOTE: This sheet is a summary. It may not cover all possible information. If you have questions about this medicine, talk to your doctor, pharmacist, or health care provider.  2013, [employer]. (02/10/2011 9:08:15 AM)  

## 2012-11-25 ENCOUNTER — Ambulatory Visit: Payer: Medicare Other | Admitting: Physical Therapy

## 2012-11-28 ENCOUNTER — Ambulatory Visit: Payer: Medicare Other | Attending: Family Medicine | Admitting: Physical Therapy

## 2012-11-28 DIAGNOSIS — IMO0001 Reserved for inherently not codable concepts without codable children: Secondary | ICD-10-CM | POA: Insufficient documentation

## 2012-11-28 DIAGNOSIS — M545 Low back pain, unspecified: Secondary | ICD-10-CM | POA: Insufficient documentation

## 2012-11-28 DIAGNOSIS — M25519 Pain in unspecified shoulder: Secondary | ICD-10-CM | POA: Insufficient documentation

## 2012-12-05 ENCOUNTER — Ambulatory Visit: Payer: Medicare Other | Admitting: Physical Therapy

## 2012-12-10 ENCOUNTER — Ambulatory Visit: Payer: Medicare Other | Admitting: Physical Therapy

## 2012-12-12 ENCOUNTER — Ambulatory Visit: Payer: Medicare Other | Admitting: Physical Therapy

## 2012-12-24 ENCOUNTER — Ambulatory Visit: Payer: Medicare Other | Admitting: Physical Therapy

## 2012-12-26 ENCOUNTER — Ambulatory Visit: Payer: 59 | Admitting: Physical Therapy

## 2013-11-28 ENCOUNTER — Encounter (HOSPITAL_COMMUNITY): Payer: Self-pay

## 2013-11-28 ENCOUNTER — Ambulatory Visit (HOSPITAL_COMMUNITY)
Admission: RE | Admit: 2013-11-28 | Discharge: 2013-11-28 | Disposition: A | Discharge status: Home / Self Care | Payer: Medicare Other | Source: Ambulatory Visit | Attending: Family Medicine | Admitting: Family Medicine

## 2013-11-28 DIAGNOSIS — M810 Age-related osteoporosis without current pathological fracture: Secondary | ICD-10-CM | POA: Insufficient documentation

## 2013-11-28 HISTORY — DX: Age-related osteoporosis without current pathological fracture: M81.0

## 2013-11-28 MED ORDER — ZOLEDRONIC ACID 5 MG/100ML IV SOLN
5.0000 mg | Freq: Once | INTRAVENOUS | Status: AC
  Administered 2013-11-28: 5 mg via INTRAVENOUS
  Filled 2013-11-28: qty 100

## 2013-11-28 MED ORDER — SODIUM CHLORIDE 0.9 % IV SOLN
250.0000 mL | Freq: Once | INTRAVENOUS | Status: AC
  Administered 2013-11-28: 250 mL via INTRAVENOUS

## 2014-12-11 ENCOUNTER — Encounter (HOSPITAL_COMMUNITY)
Admission: RE | Admit: 2014-12-11 | Discharge: 2014-12-11 | Disposition: A | Discharge status: Home / Self Care | Payer: Medicare Other | Source: Ambulatory Visit | Attending: Family Medicine | Admitting: Family Medicine

## 2014-12-11 ENCOUNTER — Other Ambulatory Visit (HOSPITAL_COMMUNITY): Payer: Self-pay | Admitting: Family Medicine

## 2014-12-11 ENCOUNTER — Encounter (HOSPITAL_COMMUNITY): Payer: Self-pay

## 2014-12-11 DIAGNOSIS — M810 Age-related osteoporosis without current pathological fracture: Secondary | ICD-10-CM | POA: Diagnosis present

## 2014-12-11 MED ORDER — ZOLEDRONIC ACID 5 MG/100ML IV SOLN
5.0000 mg | Freq: Once | INTRAVENOUS | Status: AC
  Administered 2014-12-11: 5 mg via INTRAVENOUS
  Filled 2014-12-11: qty 100

## 2014-12-11 MED ORDER — SODIUM CHLORIDE 0.9 % IV SOLN
Freq: Once | INTRAVENOUS | Status: AC
  Administered 2014-12-11: 14:00:00 via INTRAVENOUS

## 2014-12-11 NOTE — Discharge Instructions (Signed)

## 2015-07-27 ENCOUNTER — Telehealth: Payer: Self-pay | Admitting: Gastroenterology

## 2015-07-27 NOTE — Telephone Encounter (Signed)
Former patient of Dr Arlyce DiceKaplan. She complains of "gas or indigestion", also describes it as "gnawing". Pepto-bismol helped a little. Her bowels have been irregular also. She is not taking any fiber supplements. She takes Ranitidine PRN. Denies vomiting abdominal pain or bloody stools. Appointment scheduled with Doug SouJessica [name] 08/03/15 at 2:30 pm. Patient may take Ranitidine as directed until seen.

## 2015-08-03 ENCOUNTER — Ambulatory Visit (INDEPENDENT_AMBULATORY_CARE_PROVIDER_SITE_OTHER): Payer: Medicare Other | Admitting: Gastroenterology

## 2015-08-03 ENCOUNTER — Encounter: Payer: Self-pay | Admitting: Gastroenterology

## 2015-08-03 VITALS — BP 126/62 | HR 70 | Ht 63.0 in | Wt 159.0 lb

## 2015-08-03 DIAGNOSIS — Z791 Long term (current) use of non-steroidal anti-inflammatories (NSAID): Secondary | ICD-10-CM

## 2015-08-03 DIAGNOSIS — R1013 Epigastric pain: Secondary | ICD-10-CM | POA: Diagnosis not present

## 2015-08-03 DIAGNOSIS — R630 Anorexia: Secondary | ICD-10-CM

## 2015-08-03 MED ORDER — PANTOPRAZOLE SODIUM 40 MG PO TBEC: 40.0000 mg | DELAYED_RELEASE_TABLET | Freq: Every day | ORAL | Status: DC

## 2015-08-03 NOTE — Patient Instructions (Signed)
Discontinue the use of NSAIDS.   We have sent the following medications to your pharmacy for you to pick up at your convenience: Pantoprazole

## 2015-08-04 NOTE — Progress Notes (Signed)
     08/04/2015 Robin ShiversBarbara U [name] [id number] [birth year]   History of Present Illness:  This is a pleasant is previously known to Dr. Arlyce DiceKaplan. Her last colonoscopy was in March 2012 at which time she was found have only moderate diverticulosis in the sigmoid colon and internal hemorrhoids.  Her care will be assumed by Dr. Lavon PaganiniNandigam in Dr. Marzetta BoardKaplan's absence.  She presents to our office today with complaints of 1-2 weeks of upper abdominal discomfort that is described as a gnawing sensation, along with poor appetite and reflux type symptoms. She does admit that she has been taking ibuprofen a couple of times daily every single day for several years for arthritic pains. She recently went to the walk-in clinic and had some labs performed, which were apparently unremarkable. We are trying to obtain those results. They gave her Levsin to try for her symptoms as needed and told her to follow up with GI. She does not take any acid reflux medicines on a regular basis, but does have Zantac 150 mg to use as needed. She did not think to try that recently with her current symptoms, however.  She has discontinued her NSAID use and is actually feeling much better for the past week or so.  She also mentions that she was having some constipation recently, which is different from her usual diarrhea, but had been taking muscle relaxants.  This has improved at this point as well.  Current Medications, Allergies, Past Medical History, Past Surgical History, Family History and Social History were reviewed in [facility] electronic medical record.   Physical Exam: BP 126/62 mmHg  Pulse 70  Ht 5\' 3"  (1.6 m)  Wt 159 lb (72.122 kg)  BMI 28.17 kg/m2 General: Well developed white female in no acute distress Head: Normocephalic and atraumatic Eyes:  Sclerae anicteric, conjunctiva pink  Ears: Normal auditory acuity Lungs: Clear throughout to auscultation Heart: Regular rate and rhythm Abdomen: Soft, non-distended.  Normal  bowel sounds.  Non-tender. Musculoskeletal: Symmetrical with no gross deformities  Extremities: No edema  Neurological: Alert oriented x 4, grossly non-focal Psychological:  Alert and cooperative. Normal mood and affect  Assessment and Recommendations: -[age] year old female with epigastric abdominal discomfort, poor appetite, and reflux-type symptoms with NSAID use:  Has now been off of Ibuprofen and is feeling much better.  Will remain off of NSAID's.  Will start pantoprazole 40 mg daily for 4-8 weeks.  If symptoms return again then she will call back and we will consider EGD for evaluation.   -Constipation:  Resolved once she discontinued muscle relaxant.    *Will try to obtain recent labs from [facility] walk-in [facility].

## 2015-08-06 NOTE — Progress Notes (Signed)
Reviewed and agree with documentation and assessment and plan. K. Veena Malillany Kazlauskas , MD   

## 2015-09-23 ENCOUNTER — Telehealth: Payer: Self-pay | Admitting: Gastroenterology

## 2015-09-23 NOTE — Telephone Encounter (Signed)
Patient given recommendations as per OV note. (take it 4-8 weeks then stop and see if symptoms return. If do possible, EGD)

## 2015-12-16 ENCOUNTER — Encounter (HOSPITAL_COMMUNITY): Payer: Self-pay

## 2015-12-16 ENCOUNTER — Encounter (HOSPITAL_COMMUNITY)
Admission: RE | Admit: 2015-12-16 | Discharge: 2015-12-16 | Disposition: A | Discharge status: Home / Self Care | Payer: Medicare Other | Source: Ambulatory Visit | Attending: Family Medicine | Admitting: Family Medicine

## 2015-12-16 DIAGNOSIS — M810 Age-related osteoporosis without current pathological fracture: Secondary | ICD-10-CM | POA: Diagnosis not present

## 2015-12-16 MED ORDER — ZOLEDRONIC ACID 5 MG/100ML IV SOLN
5.0000 mg | Freq: Once | INTRAVENOUS | Status: AC
  Administered 2015-12-16: 5 mg via INTRAVENOUS
  Filled 2015-12-16: qty 100

## 2015-12-16 MED ORDER — SODIUM CHLORIDE 0.9 % IV SOLN
Freq: Once | INTRAVENOUS | Status: AC
  Administered 2015-12-16: 12:00:00 via INTRAVENOUS

## 2015-12-16 NOTE — Discharge Instructions (Signed)

## 2016-12-19 ENCOUNTER — Encounter (HOSPITAL_COMMUNITY): Payer: Self-pay

## 2016-12-19 ENCOUNTER — Ambulatory Visit (HOSPITAL_COMMUNITY)
Admission: RE | Admit: 2016-12-19 | Discharge: 2016-12-19 | Disposition: A | Discharge status: Home / Self Care | Payer: Medicare Other | Source: Ambulatory Visit | Attending: Family Medicine | Admitting: Family Medicine

## 2016-12-19 DIAGNOSIS — M810 Age-related osteoporosis without current pathological fracture: Secondary | ICD-10-CM | POA: Diagnosis present

## 2016-12-19 HISTORY — DX: Cardiac murmur, unspecified: R01.1

## 2016-12-19 MED ORDER — ZOLEDRONIC ACID 5 MG/100ML IV SOLN
5.0000 mg | Freq: Once | INTRAVENOUS | Status: AC
  Administered 2016-12-19: 5 mg via INTRAVENOUS
  Filled 2016-12-19: qty 100

## 2016-12-19 MED ORDER — SODIUM CHLORIDE 0.9 % IV SOLN
Freq: Once | INTRAVENOUS | Status: AC
  Administered 2016-12-19: 14:00:00 via INTRAVENOUS

## 2016-12-19 NOTE — Discharge Instructions (Signed)
Zoledronic Acid injection (Paget's Disease, Osteoporosis)/Reclast Infusion  °What is this medicine? °ZOLEDRONIC ACID (ZOE le dron ik AS id) lowers the amount of calcium loss from bone. It is used to treat Paget's disease and osteoporosis in women. °This medicine may be used for other purposes; ask your health care provider or pharmacist if you have questions. °COMMON BRAND NAME(S): Reclast, Zometa °What should I tell my health care provider before I take this medicine? °They need to know if you have any of these conditions: °-aspirin-sensitive asthma °-cancer, especially if you are receiving medicines used to treat cancer °-dental disease or wear dentures °-infection °-kidney disease °-low levels of calcium in the blood °-past surgery on the parathyroid gland or intestines °-receiving corticosteroids like dexamethasone or prednisone °-an unusual or allergic reaction to zoledronic acid, other medicines, foods, dyes, or preservatives °-pregnant or trying to get pregnant °-breast-feeding °How should I use this medicine? °This medicine is for infusion into a vein. It is given by a health care professional in a hospital or clinic setting. °Talk to your pediatrician regarding the use of this medicine in children. This medicine is not approved for use in children. °Overdosage: If you think you have taken too much of this medicine contact a poison control center or emergency room at once. °NOTE: This medicine is only for you. Do not share this medicine with others. °What if I miss a dose? °It is important not to miss your dose. Call your doctor or health care professional if you are unable to keep an appointment. °What may interact with this medicine? °-certain antibiotics given by injection °-NSAIDs, medicines for pain and inflammation, like ibuprofen or naproxen °-some diuretics like bumetanide, furosemide °-teriparatide °This list may not describe all possible interactions. Give your health care provider a list of all  the medicines, herbs, non-prescription drugs, or dietary supplements you use. Also tell them if you smoke, drink alcohol, or use illegal drugs. Some items may interact with your medicine. °What should I watch for while using this medicine? °Visit your doctor or health care professional for regular checkups. It may be some time before you see the benefit from this medicine. Do not stop taking your medicine unless your doctor tells you to. Your doctor may order blood tests or other tests to see how you are doing. °Women should inform their doctor if they wish to become pregnant or think they might be pregnant. There is a potential for serious side effects to an unborn child. Talk to your health care professional or pharmacist for more information. °You should make sure that you get enough calcium and vitamin D while you are taking this medicine. Discuss the foods you eat and the vitamins you take with your health care professional. °Some people who take this medicine have severe bone, joint, and/or muscle pain. This medicine may also increase your risk for jaw problems or a broken thigh bone. Tell your doctor right away if you have severe pain in your jaw, bones, joints, or muscles. Tell your doctor if you have any pain that does not go away or that gets worse. °Tell your dentist and dental surgeon that you are taking this medicine. You should not have major dental surgery while on this medicine. See your dentist to have a dental exam and fix any dental problems before starting this medicine. Take good care of your teeth while on this medicine. Make sure you see your dentist for regular follow-up appointments. °What side effects may I notice from receiving   this medicine? °Side effects that you should report to your doctor or health care professional as soon as possible: °-allergic reactions like skin rash, itching or hives, swelling of the face, lips, or tongue °-anxiety, confusion, or depression °-breathing  problems °-changes in vision °-eye pain °-feeling faint or lightheaded, falls °-jaw pain, especially after dental work °-mouth sores °-muscle cramps, stiffness, or weakness °-redness, blistering, peeling or loosening of the skin, including inside the mouth °-trouble passing urine or change in the amount of urine °Side effects that usually do not require medical attention (report to your doctor or health care professional if they continue or are bothersome): °-bone, joint, or muscle pain °-constipation °-diarrhea °-fever °-hair loss °-irritation at site where injected °-loss of appetite °-nausea, vomiting °-stomach upset °-trouble sleeping °-trouble swallowing °-weak or tired °This list may not describe all possible side effects. Call your doctor for medical advice about side effects. You may report side effects to FDA at 1-800-FDA-1088. °Where should I keep my medicine? °This drug is given in a hospital or clinic and will not be stored at home. °NOTE: This sheet is a summary. It may not cover all possible information. If you have questions about this medicine, talk to your doctor, pharmacist, or health care provider. °© 2018 [employer]vier/[employer] Standard (2014-01-10 14:19:57) ° °

## 2017-06-26 ENCOUNTER — Other Ambulatory Visit (HOSPITAL_COMMUNITY): Payer: Self-pay | Admitting: Respiratory Therapy

## 2017-06-26 DIAGNOSIS — Z87891 Personal history of nicotine dependence: Secondary | ICD-10-CM

## 2017-06-26 DIAGNOSIS — R0609 Other forms of dyspnea: Principal | ICD-10-CM

## 2017-06-29 ENCOUNTER — Inpatient Hospital Stay (HOSPITAL_COMMUNITY): Admission: RE | Admit: 2017-06-29 | Payer: Self-pay | Source: Ambulatory Visit

## 2017-06-29 ENCOUNTER — Other Ambulatory Visit (HOSPITAL_COMMUNITY): Payer: Self-pay | Admitting: Respiratory Therapy

## 2017-06-29 ENCOUNTER — Ambulatory Visit (HOSPITAL_COMMUNITY)
Admission: RE | Admit: 2017-06-29 | Discharge: 2017-06-29 | Disposition: A | Discharge status: Home / Self Care | Payer: Medicare Other | Source: Ambulatory Visit | Attending: Family Medicine | Admitting: Family Medicine

## 2017-06-29 DIAGNOSIS — R0609 Other forms of dyspnea: Secondary | ICD-10-CM | POA: Diagnosis present

## 2017-06-29 DIAGNOSIS — J449 Chronic obstructive pulmonary disease, unspecified: Secondary | ICD-10-CM | POA: Insufficient documentation

## 2017-06-29 DIAGNOSIS — J984 Other disorders of lung: Secondary | ICD-10-CM | POA: Diagnosis not present

## 2017-06-29 LAB — PULMONARY FUNCTION TEST
DL/VA % PRED: 88 %
DL/VA: 4.15 ml/min/mmHg/L
DLCO UNC: 12.87 ml/min/mmHg
DLCO unc % pred: 56 %
FEF 25-75 Post: 0.66 L/sec
FEF 25-75 Pre: 0.68 L/sec
FEF2575-%Change-Post: -3 %
FEF2575-%Pred-Post: 55 %
FEF2575-%Pred-Pre: 57 %
FEV1-%CHANGE-POST: 0 %
FEV1-%Pred-Post: 62 %
FEV1-%Pred-Pre: 61 %
FEV1-PRE: 1.06 L
FEV1-Post: 1.07 L
FEV1FVC-%Change-Post: 0 %
FEV1FVC-%Pred-Pre: 91 %
FEV6-%Change-Post: 0 %
FEV6-%PRED-POST: 71 %
FEV6-%PRED-PRE: 71 %
FEV6-Post: 1.59 L
FEV6-Pre: 1.59 L
FEV6FVC-%Change-Post: 0 %
FEV6FVC-%PRED-POST: 106 %
FEV6FVC-%PRED-PRE: 106 %
FVC-%Change-Post: 0 %
FVC-%PRED-POST: 67 %
FVC-%PRED-PRE: 67 %
FVC-POST: 1.59 L
FVC-Pre: 1.59 L
PRE FEV6/FVC RATIO: 100 %
Post FEV1/FVC ratio: 68 %
Post FEV6/FVC ratio: 100 %
Pre FEV1/FVC ratio: 67 %
RV % PRED: 159 %
RV: 3.83 L
TLC % pred: 113 %
TLC: 5.57 L

## 2017-06-29 MED ORDER — ALBUTEROL SULFATE (2.5 MG/3ML) 0.083% IN NEBU
2.5000 mg | INHALATION_SOLUTION | Freq: Once | RESPIRATORY_TRACT | Status: AC
  Administered 2017-06-29: 2.5 mg via RESPIRATORY_TRACT

## 2017-12-20 ENCOUNTER — Encounter (HOSPITAL_COMMUNITY): Payer: Self-pay

## 2017-12-20 ENCOUNTER — Ambulatory Visit (HOSPITAL_COMMUNITY)
Admission: RE | Admit: 2017-12-20 | Discharge: 2017-12-20 | Disposition: A | Discharge status: Home / Self Care | Payer: Medicare Other | Source: Ambulatory Visit | Attending: Family Medicine | Admitting: Family Medicine

## 2017-12-20 DIAGNOSIS — M810 Age-related osteoporosis without current pathological fracture: Secondary | ICD-10-CM | POA: Diagnosis not present

## 2017-12-20 MED ORDER — ZOLEDRONIC ACID 5 MG/100ML IV SOLN
5.0000 mg | Freq: Once | INTRAVENOUS | Status: AC
  Administered 2017-12-20: 5 mg via INTRAVENOUS
  Filled 2017-12-20: qty 100

## 2017-12-20 MED ORDER — SODIUM CHLORIDE 0.9 % IV SOLN
Freq: Once | INTRAVENOUS | Status: AC
  Administered 2017-12-20: 14:00:00 via INTRAVENOUS

## 2017-12-20 NOTE — Discharge Instructions (Signed)
Zoledronic Acid injection (Paget's Disease, Osteoporosis)/ Reclast °What is this medicine? °ZOLEDRONIC ACID (ZOE le dron ik AS id) lowers the amount of calcium loss from bone. It is used to treat Paget's disease and osteoporosis in women. °This medicine may be used for other purposes; ask your health care provider or pharmacist if you have questions. °COMMON BRAND NAME(S): Reclast, Zometa °What should I tell my health care provider before I take this medicine? °They need to know if you have any of these conditions: °-aspirin-sensitive asthma °-cancer, especially if you are receiving medicines used to treat cancer °-dental disease or wear dentures °-infection °-kidney disease °-low levels of calcium in the blood °-past surgery on the parathyroid gland or intestines °-receiving corticosteroids like dexamethasone or prednisone °-an unusual or allergic reaction to zoledronic acid, other medicines, foods, dyes, or preservatives °-pregnant or trying to get pregnant °-breast-feeding °How should I use this medicine? °This medicine is for infusion into a vein. It is given by a health care professional in a hospital or clinic setting. °Talk to your pediatrician regarding the use of this medicine in children. This medicine is not approved for use in children. °Overdosage: If you think you have taken too much of this medicine contact a poison control center or emergency room at once. °NOTE: This medicine is only for you. Do not share this medicine with others. °What if I miss a dose? °It is important not to miss your dose. Call your doctor or health care professional if you are unable to keep an appointment. °What may interact with this medicine? °-certain antibiotics given by injection °-NSAIDs, medicines for pain and inflammation, like ibuprofen or naproxen °-some diuretics like bumetanide, furosemide °-teriparatide °This list may not describe all possible interactions. Give your health care provider a list of all the  medicines, herbs, non-prescription drugs, or dietary supplements you use. Also tell them if you smoke, drink alcohol, or use illegal drugs. Some items may interact with your medicine. °What should I watch for while using this medicine? °Visit your doctor or health care professional for regular checkups. It may be some time before you see the benefit from this medicine. Do not stop taking your medicine unless your doctor tells you to. Your doctor may order blood tests or other tests to see how you are doing. °Women should inform their doctor if they wish to become pregnant or think they might be pregnant. There is a potential for serious side effects to an unborn child. Talk to your health care professional or pharmacist for more information. °You should make sure that you get enough calcium and vitamin D while you are taking this medicine. Discuss the foods you eat and the vitamins you take with your health care professional. °Some people who take this medicine have severe bone, joint, and/or muscle pain. This medicine may also increase your risk for jaw problems or a broken thigh bone. Tell your doctor right away if you have severe pain in your jaw, bones, joints, or muscles. Tell your doctor if you have any pain that does not go away or that gets worse. °Tell your dentist and dental surgeon that you are taking this medicine. You should not have major dental surgery while on this medicine. See your dentist to have a dental exam and fix any dental problems before starting this medicine. Take good care of your teeth while on this medicine. Make sure you see your dentist for regular follow-up appointments. °What side effects may I notice from receiving this   medicine? °Side effects that you should report to your doctor or health care professional as soon as possible: °-allergic reactions like skin rash, itching or hives, swelling of the face, lips, or tongue °-anxiety, confusion, or depression °-breathing  problems °-changes in vision °-eye pain °-feeling faint or lightheaded, falls °-jaw pain, especially after dental work °-mouth sores °-muscle cramps, stiffness, or weakness °-redness, blistering, peeling or loosening of the skin, including inside the mouth °-trouble passing urine or change in the amount of urine °Side effects that usually do not require medical attention (report to your doctor or health care professional if they continue or are bothersome): °-bone, joint, or muscle pain °-constipation °-diarrhea °-fever °-hair loss °-irritation at site where injected °-loss of appetite °-nausea, vomiting °-stomach upset °-trouble sleeping °-trouble swallowing °-weak or tired °This list may not describe all possible side effects. Call your doctor for medical advice about side effects. You may report side effects to FDA at 1-800-FDA-1088. °Where should I keep my medicine? °This drug is given in a hospital or clinic and will not be stored at home. °NOTE: This sheet is a summary. It may not cover all possible information. If you have questions about this medicine, talk to your doctor, pharmacist, or health care provider. °© 2018 [employer] Standard (2014-01-10 14:19:57) ° °

## 2017-12-20 NOTE — Progress Notes (Signed)
Hypertensive today, higher post infusion . See flowsheet, advised Pt to take BP at home and follow up PCP, she verbalized understanding. Pt tolerated infusion well otherwise.

## 2018-08-29 ENCOUNTER — Other Ambulatory Visit: Payer: Self-pay

## 2018-08-29 ENCOUNTER — Emergency Department (HOSPITAL_COMMUNITY): Payer: Medicare Other

## 2018-08-29 ENCOUNTER — Emergency Department (HOSPITAL_COMMUNITY)
Admission: EM | Admit: 2018-08-29 | Discharge: 2018-08-30 | Disposition: A | Discharge status: Home / Self Care | Payer: Medicare Other | Attending: Emergency Medicine | Admitting: Emergency Medicine

## 2018-08-29 ENCOUNTER — Encounter (HOSPITAL_COMMUNITY): Payer: Self-pay | Admitting: *Deleted

## 2018-08-29 DIAGNOSIS — Z7982 Long term (current) use of aspirin: Secondary | ICD-10-CM | POA: Diagnosis not present

## 2018-08-29 DIAGNOSIS — J441 Chronic obstructive pulmonary disease with (acute) exacerbation: Secondary | ICD-10-CM

## 2018-08-29 DIAGNOSIS — Z87891 Personal history of nicotine dependence: Secondary | ICD-10-CM | POA: Insufficient documentation

## 2018-08-29 DIAGNOSIS — R05 Cough: Secondary | ICD-10-CM

## 2018-08-29 DIAGNOSIS — R062 Wheezing: Secondary | ICD-10-CM | POA: Insufficient documentation

## 2018-08-29 DIAGNOSIS — I10 Essential (primary) hypertension: Secondary | ICD-10-CM | POA: Insufficient documentation

## 2018-08-29 DIAGNOSIS — R059 Cough, unspecified: Secondary | ICD-10-CM

## 2018-08-29 DIAGNOSIS — R0602 Shortness of breath: Secondary | ICD-10-CM | POA: Diagnosis not present

## 2018-08-29 MED ORDER — ALBUTEROL SULFATE HFA 108 (90 BASE) MCG/ACT IN AERS: 2.0000 | INHALATION_SPRAY | RESPIRATORY_TRACT | 1 refills | Status: DC | PRN

## 2018-08-29 MED ORDER — PREDNISONE 20 MG PO TABS: 40.0000 mg | ORAL_TABLET | Freq: Every day | ORAL | 0 refills | Status: DC

## 2018-08-29 MED ORDER — ALBUTEROL SULFATE (2.5 MG/3ML) 0.083% IN NEBU
5.0000 mg | INHALATION_SOLUTION | Freq: Once | RESPIRATORY_TRACT | Status: AC
  Administered 2018-08-29: 5 mg via RESPIRATORY_TRACT

## 2018-08-29 MED ORDER — ALBUTEROL SULFATE (2.5 MG/3ML) 0.083% IN NEBU
5.0000 mg | INHALATION_SOLUTION | Freq: Once | RESPIRATORY_TRACT | Status: DC
  Filled 2018-08-29: qty 6

## 2018-08-29 MED ORDER — DOXYCYCLINE HYCLATE 100 MG PO CAPS
100.0000 mg | ORAL_CAPSULE | Freq: Two times a day (BID) | ORAL | 0 refills | Status: DC
Start: 2018-08-30 — End: 2018-09-09

## 2018-08-29 MED ORDER — PREDNISONE 20 MG PO TABS
40.0000 mg | ORAL_TABLET | Freq: Once | ORAL | Status: AC
  Administered 2018-08-29: 40 mg via ORAL
  Filled 2018-08-29: qty 2

## 2018-08-29 MED ORDER — IPRATROPIUM BROMIDE 0.02 % IN SOLN
0.5000 mg | Freq: Once | RESPIRATORY_TRACT | Status: AC
  Administered 2018-08-29: 0.5 mg via RESPIRATORY_TRACT
  Filled 2018-08-29: qty 2.5

## 2018-08-29 MED ORDER — DOXYCYCLINE HYCLATE 100 MG PO TABS
100.0000 mg | ORAL_TABLET | Freq: Once | ORAL | Status: AC
  Administered 2018-08-29: 100 mg via ORAL
  Filled 2018-08-29: qty 1

## 2018-08-29 NOTE — ED Provider Notes (Signed)
[facility]-EMERGENCY DEPT Provider Note   CSN: [id number] Arrival date & time: 08/29/18  2003     History   Chief Complaint Chief Complaint  Patient presents with  . Shortness of Breath    HPI Robin [name] is a 85 y.o. female.  Patient presents w several day history of productive cough, chest congestion, and wheezing. Symptoms episodic, moderate, persistent, slowly worse. Was seen at urgent care and referred to ER for evaluation. Uses inhaler at home, but had not used today. No fever. No sore throat or runny nose. No headache/body aches. Former smoker, ?hx copd. Denies chest pain or discomfort. No leg pain or swelling.   The history is provided by the patient and a relative.  Shortness of Breath  Associated symptoms include sore throat, cough and wheezing. Pertinent negatives include no fever, no headaches, no neck pain, no chest pain, no abdominal pain and no rash.    Past Medical History:  Diagnosis Date  . Depression   . Diverticulosis   . GERD (gastroesophageal reflux disease)   . Heart murmur   . Hyperlipemia   . Hypertension   . Irregular heart beat 2014   patient states Robin [name] has not heard back about echo.   . Osteoporosis     Patient Active Problem List   Diagnosis Date Noted  . Abdominal pain, epigastric 08/03/2015  . NSAID long-term use 08/03/2015  . Poor appetite 08/03/2015  . CHANGE IN BOWELS 10/28/2010  . DIVERTICULOSIS, COLON, HX OF 10/28/2010  . ABDOMINAL PAIN, PERIUMBILIC 11/05/2007    Past Surgical History:  Procedure Laterality Date  . ABDOMINAL HYSTERECTOMY     both appy and hyst done together  . APPENDECTOMY       OB History   No obstetric history on file.      Home Medications    Prior to Admission medications   Medication Sig Start Date End Date Taking? Authorizing Provider  aspirin 81 MG tablet Take 81 mg by mouth daily.    [provider]  calcium-vitamin D (OSCAL WITH D) 500-200 MG-UNIT per  tablet Take 1 tablet by mouth daily.    [provider]  hyoscyamine (LEVSIN, ANASPAZ) 0.125 MG tablet Take 0.125 mg by mouth every 6 (six) hours as needed.    [provider]  lovastatin (MEVACOR) 40 MG tablet Take 40 mg by mouth at bedtime.      [provider]  Multiple Vitamins-Minerals ([employer] SPECTRAVITE ADULT 50+ PO) Take 1 capsule by mouth daily.    [provider]  pantoprazole (PROTONIX) 40 MG tablet Take 1 tablet (40 mg total) by mouth daily. 08/03/15   Zehr, Princella PellegriniJessica D, PA-C  ranitidine (ZANTAC) 150 MG tablet Take 150 mg by mouth as needed for heartburn.    [provider]  sertraline (ZOLOFT) 100 MG tablet Take 100 mg by mouth daily.      [provider]  tiotropium (SPIRIVA) 18 MCG inhalation capsule Place 18 mcg into inhaler and inhale daily.      [provider]  valsartan-hydrochlorothiazide (DIOVAN-HCT) 320-12.5 MG per tablet Take 1 tablet by mouth daily.      [provider]    Family History Family History  Problem Relation Age of Onset  . Lung cancer Father   . Cancer Sister        biliary    Social History Social History   Tobacco Use  . Smoking status: Former Smoker    Last attempt to quit:  08/28/2008    Years since quitting: 10.0  . Smokeless tobacco: Never Used  Substance Use Topics  . Alcohol use: Yes    Alcohol/week: 7.0 standard drinks    Types: 7 drink(s) per week    Comment: glass of wine daily  . Drug use: No     Allergies   Fosamax [alendronate sodium]; Augmentin [amoxicillin-pot clavulanate]; and Sulfonamide derivatives   Review of Systems Review of Systems  Constitutional: Negative for fever.  HENT: Positive for sore throat.   Eyes: Negative for redness.  Respiratory: Positive for cough, shortness of breath and wheezing.   Cardiovascular: Negative for chest pain.  Gastrointestinal: Negative for abdominal pain.  Genitourinary: Negative for flank pain.  Musculoskeletal:  Negative for neck pain and neck stiffness.  Skin: Negative for rash.  Neurological: Negative for headaches.  Hematological: Does not bruise/bleed easily.  Psychiatric/Behavioral: Negative for confusion.     Physical Exam Updated Vital Signs BP (!) 169/79   Pulse 98   Temp 97.7 F (36.5 C) (Oral)   Resp (!) 23   Ht 1.6 m (5\' 3" )   Wt 70.3 kg   SpO2 95%   BMI 27.46 kg/m   Physical Exam Vitals signs and nursing note reviewed.  Constitutional:      Appearance: Normal appearance. Robin [name] is well-developed.  HENT:     Head: Atraumatic.     Nose: Nose normal.     Mouth/Throat:     Mouth: Mucous membranes are moist.  Eyes:     General: No scleral icterus.    Conjunctiva/sclera: Conjunctivae normal.  Neck:     Musculoskeletal: Normal range of motion and neck supple. No neck rigidity or muscular tenderness.     Trachea: No tracheal deviation.  Cardiovascular:     Rate and Rhythm: Normal rate and regular rhythm.     Pulses: Normal pulses.     Heart sounds: Normal heart sounds. No murmur. No friction rub. No gallop.   Pulmonary:     Effort: Pulmonary effort is normal. No respiratory distress.     Breath sounds: Wheezing present.     Comments: Coughing. Upper resp congestion.  Abdominal:     General: Bowel sounds are normal. There is no distension.     Palpations: Abdomen is soft.     Tenderness: There is no abdominal tenderness. There is no guarding.  Genitourinary:    Comments: No cva tenderness.  Musculoskeletal:        General: No swelling.     Right lower leg: No edema.     Left lower leg: No edema.  Skin:    General: Skin is warm and dry.     Findings: No rash.  Neurological:     Mental Status: Robin [name] is alert.     Comments: Alert, speech normal.   Psychiatric:        Mood and Affect: Mood normal.      ED Treatments / Results  Labs (all labs ordered are listed, but only abnormal results are displayed) Labs Reviewed - No data to  display  EKG None  Radiology Dg Chest 2 View  Result Date: 08/29/2018 CLINICAL DATA:  Shortness of breath for 1 week. EXAM: CHEST - 2 VIEW COMPARISON:  Radiographs 05/20/2018 FINDINGS: Chronic hyperinflation, unchanged from prior exam. No focal airspace disease. Unchanged heart size and mediastinal contours with aortic atherosclerosis. No pulmonary edema, pleural effusion or pneumothorax. Multilevel degenerative change in the spine. Minimal loss of height of L1 vertebra is unchanged from 08/11/2018  spine radiographs. Superior subluxation of right humeral head suggesting chronic rotator cuff arthropathy. IMPRESSION: Chronic hyperinflation without acute abnormality. Electronically Signed   By: Narda RutherfordMelanie  Sanford M.D.   On: 08/29/2018 21:00    Procedures Procedures (including critical care time)  Medications Ordered in ED Medications  albuterol (PROVENTIL) (2.5 MG/3ML) 0.083% nebulizer solution 5 mg (5 mg Nebulization Not Given 08/29/18 2205)  predniSONE (DELTASONE) tablet 40 mg (has no administration in time range)  albuterol (PROVENTIL) (2.5 MG/3ML) 0.083% nebulizer solution 5 mg (has no administration in time range)  ipratropium (ATROVENT) nebulizer solution 0.5 mg (has no administration in time range)  doxycycline (VIBRA-TABS) tablet 100 mg (has no administration in time range)     Initial Impression / Assessment and Plan / ED Course  I have reviewed the triage vital signs and the nursing notes.  Pertinent labs & imaging results that were available during my care of the patient were reviewed by me and considered in my medical decision making (see chart for details).  Albuterol neb. Cxr.   Persistent wheezing. Albuterol and atrovent neb. Prednisone po.   Doxycycline po.   Po fluids.   Reviewed nursing notes and prior charts for additional history.   Improved air exchange. Breathing comfortably. Pulse ox 97-98% room air.   rx for home provided.  Rec close pcp f/u. Return  precautions provided.     Final Clinical Impressions(s) / ED Diagnoses   Final diagnoses:  None    ED Discharge Orders    None       Cathren LaineSteinl, Davianna Deutschman, MD 08/29/18 2329

## 2018-08-29 NOTE — ED Triage Notes (Signed)
Pt sent from [facility] clinic d/t shortness of breath.  Pt was given a breathing tx & told to come here.  Pt has hx of COPD.  Pt has a productive cough that is clear.

## 2018-08-29 NOTE — ED Notes (Signed)
Patient transported to X-ray via w/c 

## 2018-08-29 NOTE — Discharge Instructions (Addendum)
It was our pleasure to provide your ER care today - we hope that you feel better.  Rest. Drink plenty of fluids.   Take antibiotic as prescribed. Take prednisone as prescribed. Use albuterol inhaler 2 puffs every 4 hours as need.   Follow up with primary care doctor in the next 1-2 days if symptoms fail to improve.  Return to ER if worse, new symptoms, increased difficulty breathing, other concern.

## 2018-09-04 ENCOUNTER — Other Ambulatory Visit: Payer: Self-pay

## 2018-09-04 ENCOUNTER — Inpatient Hospital Stay (HOSPITAL_COMMUNITY)
Admission: EM | Admit: 2018-09-04 | Discharge: 2018-09-09 | DRG: 641 | Disposition: A | Discharge status: Home Health | Payer: Medicare Other | Attending: Internal Medicine | Admitting: Internal Medicine

## 2018-09-04 ENCOUNTER — Encounter (HOSPITAL_COMMUNITY): Payer: Self-pay

## 2018-09-04 ENCOUNTER — Emergency Department (HOSPITAL_COMMUNITY): Payer: Medicare Other

## 2018-09-04 DIAGNOSIS — I959 Hypotension, unspecified: Secondary | ICD-10-CM | POA: Diagnosis present

## 2018-09-04 DIAGNOSIS — E785 Hyperlipidemia, unspecified: Secondary | ICD-10-CM | POA: Diagnosis present

## 2018-09-04 DIAGNOSIS — E876 Hypokalemia: Secondary | ICD-10-CM | POA: Diagnosis present

## 2018-09-04 DIAGNOSIS — Z886 Allergy status to analgesic agent status: Secondary | ICD-10-CM | POA: Diagnosis not present

## 2018-09-04 DIAGNOSIS — Z7982 Long term (current) use of aspirin: Secondary | ICD-10-CM | POA: Diagnosis not present

## 2018-09-04 DIAGNOSIS — Z801 Family history of malignant neoplasm of trachea, bronchus and lung: Secondary | ICD-10-CM

## 2018-09-04 DIAGNOSIS — Z7951 Long term (current) use of inhaled steroids: Secondary | ICD-10-CM | POA: Diagnosis not present

## 2018-09-04 DIAGNOSIS — E440 Moderate protein-calorie malnutrition: Secondary | ICD-10-CM | POA: Diagnosis present

## 2018-09-04 DIAGNOSIS — K5790 Diverticulosis of intestine, part unspecified, without perforation or abscess without bleeding: Secondary | ICD-10-CM | POA: Diagnosis present

## 2018-09-04 DIAGNOSIS — F329 Major depressive disorder, single episode, unspecified: Secondary | ICD-10-CM | POA: Diagnosis present

## 2018-09-04 DIAGNOSIS — M810 Age-related osteoporosis without current pathological fracture: Secondary | ICD-10-CM | POA: Diagnosis present

## 2018-09-04 DIAGNOSIS — R011 Cardiac murmur, unspecified: Secondary | ICD-10-CM | POA: Diagnosis present

## 2018-09-04 DIAGNOSIS — Z881 Allergy status to other antibiotic agents status: Secondary | ICD-10-CM | POA: Diagnosis not present

## 2018-09-04 DIAGNOSIS — Z888 Allergy status to other drugs, medicaments and biological substances status: Secondary | ICD-10-CM

## 2018-09-04 DIAGNOSIS — Z90710 Acquired absence of both cervix and uterus: Secondary | ICD-10-CM | POA: Diagnosis not present

## 2018-09-04 DIAGNOSIS — Z87891 Personal history of nicotine dependence: Secondary | ICD-10-CM

## 2018-09-04 DIAGNOSIS — R627 Adult failure to thrive: Secondary | ICD-10-CM | POA: Diagnosis not present

## 2018-09-04 DIAGNOSIS — E871 Hypo-osmolality and hyponatremia: Secondary | ICD-10-CM | POA: Diagnosis present

## 2018-09-04 DIAGNOSIS — J449 Chronic obstructive pulmonary disease, unspecified: Secondary | ICD-10-CM | POA: Diagnosis present

## 2018-09-04 DIAGNOSIS — Z6826 Body mass index (BMI) 26.0-26.9, adult: Secondary | ICD-10-CM

## 2018-09-04 DIAGNOSIS — R630 Anorexia: Secondary | ICD-10-CM | POA: Diagnosis not present

## 2018-09-04 DIAGNOSIS — N390 Urinary tract infection, site not specified: Secondary | ICD-10-CM | POA: Diagnosis present

## 2018-09-04 DIAGNOSIS — E860 Dehydration: Secondary | ICD-10-CM | POA: Diagnosis present

## 2018-09-04 DIAGNOSIS — Z885 Allergy status to narcotic agent status: Secondary | ICD-10-CM | POA: Diagnosis not present

## 2018-09-04 DIAGNOSIS — J438 Other emphysema: Secondary | ICD-10-CM | POA: Diagnosis not present

## 2018-09-04 DIAGNOSIS — R8281 Pyuria: Secondary | ICD-10-CM | POA: Diagnosis not present

## 2018-09-04 DIAGNOSIS — K219 Gastro-esophageal reflux disease without esophagitis: Secondary | ICD-10-CM | POA: Diagnosis present

## 2018-09-04 DIAGNOSIS — I10 Essential (primary) hypertension: Secondary | ICD-10-CM | POA: Diagnosis present

## 2018-09-04 DIAGNOSIS — Z1629 Resistance to other single specified antibiotic: Secondary | ICD-10-CM | POA: Diagnosis present

## 2018-09-04 DIAGNOSIS — Z882 Allergy status to sulfonamides status: Secondary | ICD-10-CM

## 2018-09-04 DIAGNOSIS — R5383 Other fatigue: Secondary | ICD-10-CM | POA: Diagnosis not present

## 2018-09-04 LAB — URINALYSIS, ROUTINE W REFLEX MICROSCOPIC
Bilirubin Urine: NEGATIVE
Glucose, UA: NEGATIVE mg/dL
KETONES UR: NEGATIVE mg/dL
Nitrite: NEGATIVE
Protein, ur: NEGATIVE mg/dL
Specific Gravity, Urine: 1.014 (ref 1.005–1.030)
WBC, UA: >50 WBC/hpf — ABNORMAL HIGH (ref 0–5)
pH: 5 (ref 5.0–8.0)

## 2018-09-04 LAB — COMPREHENSIVE METABOLIC PANEL
ALT: 27 U/L (ref 0–44)
AST: 25 U/L (ref 15–41)
Albumin: 3.7 g/dL (ref 3.5–5.0)
Alkaline Phosphatase: 64 U/L (ref 38–126)
Anion gap: 12 (ref 5–15)
BUN: 35 mg/dL — ABNORMAL HIGH (ref 8–23)
CO2: 24 mmol/L (ref 22–32)
Calcium: 8.8 mg/dL — ABNORMAL LOW (ref 8.9–10.3)
Chloride: 80 mmol/L — ABNORMAL LOW (ref 98–111)
Creatinine, Ser: 0.8 mg/dL (ref 0.44–1.00)
GFR calc Af Amer: >60 mL/min (ref >60)
GFR calc non Af Amer: >60 mL/min (ref >60)
Glucose, Bld: 106 mg/dL — ABNORMAL HIGH (ref 70–99)
POTASSIUM: 3.1 mmol/L — AB (ref 3.5–5.1)
SODIUM: 116 mmol/L — AB (ref 135–145)
Total Bilirubin: 1.2 mg/dL (ref 0.3–1.2)
Total Protein: 6.8 g/dL (ref 6.5–8.1)

## 2018-09-04 LAB — CREATININE, URINE, RANDOM: Creatinine, Urine: 83.6 mg/dL

## 2018-09-04 LAB — CBC WITH DIFFERENTIAL/PLATELET
Abs Immature Granulocytes: 0.13 10*3/uL — ABNORMAL HIGH (ref 0.00–0.07)
BASOS ABS: 0 10*3/uL (ref 0.0–0.1)
Basophils Relative: 0 %
Eosinophils Absolute: 0 10*3/uL (ref 0.0–0.5)
Eosinophils Relative: 0 %
HCT: 35.6 % — ABNORMAL LOW (ref 36.0–46.0)
Hemoglobin: 12.6 g/dL (ref 12.0–15.0)
Immature Granulocytes: 1 %
Lymphocytes Relative: 8 %
Lymphs Abs: 1.2 10*3/uL (ref 0.7–4.0)
MCH: 30 pg (ref 26.0–34.0)
MCHC: 35.4 g/dL (ref 30.0–36.0)
MCV: 84.8 fL (ref 80.0–100.0)
Monocytes Absolute: 1 10*3/uL (ref 0.1–1.0)
Monocytes Relative: 7 %
Neutro Abs: 12.6 10*3/uL — ABNORMAL HIGH (ref 1.7–7.7)
Neutrophils Relative %: 84 %
PLATELETS: 321 10*3/uL (ref 150–400)
RBC: 4.2 MIL/uL (ref 3.87–5.11)
RDW: 13 % (ref 11.5–15.5)
WBC: 15 10*3/uL — AB (ref 4.0–10.5)
nRBC: 0 % (ref 0.0–0.2)

## 2018-09-04 LAB — MRSA PCR SCREENING: MRSA by PCR: NEGATIVE

## 2018-09-04 LAB — PHOSPHORUS: Phosphorus: 3.3 mg/dL (ref 2.5–4.6)

## 2018-09-04 LAB — TYPE AND SCREEN
ABO/RH(D): O NEG
Antibody Screen: NEGATIVE

## 2018-09-04 LAB — MAGNESIUM: Magnesium: 1.9 mg/dL (ref 1.7–2.4)

## 2018-09-04 LAB — NA AND K (SODIUM & POTASSIUM), RAND UR
Potassium Urine: 29 mmol/L
Sodium, Ur: 71 mmol/L

## 2018-09-04 LAB — ABO/RH: ABO/RH(D): O NEG

## 2018-09-04 LAB — LIPASE, BLOOD: Lipase: 37 U/L (ref 11–51)

## 2018-09-04 LAB — OSMOLALITY: Osmolality: 248 mOsm/kg — CL (ref 275–295)

## 2018-09-04 MED ORDER — TRAMADOL HCL 50 MG PO TABS
50.0000 mg | ORAL_TABLET | Freq: Four times a day (QID) | ORAL | Status: DC | PRN
  Filled 2018-09-04: qty 1

## 2018-09-04 MED ORDER — LEVALBUTEROL HCL 0.63 MG/3ML IN NEBU: 0.6300 mg | INHALATION_SOLUTION | Freq: Four times a day (QID) | RESPIRATORY_TRACT | Status: DC | PRN

## 2018-09-04 MED ORDER — FLUTICASONE FUROATE-VILANTEROL 100-25 MCG/INH IN AEPB
1.0000 | INHALATION_SPRAY | Freq: Every day | RESPIRATORY_TRACT | Status: DC
  Administered 2018-09-05 – 2018-09-09 (×4): 1 via RESPIRATORY_TRACT
  Filled 2018-09-04: qty 28

## 2018-09-04 MED ORDER — ASPIRIN EC 81 MG PO TBEC
81.0000 mg | DELAYED_RELEASE_TABLET | Freq: Every day | ORAL | Status: DC
  Administered 2018-09-05 – 2018-09-09 (×5): 81 mg via ORAL
  Filled 2018-09-04 (×5): qty 1

## 2018-09-04 MED ORDER — ENOXAPARIN SODIUM 40 MG/0.4ML ~~LOC~~ SOLN
40.0000 mg | SUBCUTANEOUS | Status: DC
  Administered 2018-09-04 – 2018-09-08 (×5): 40 mg via SUBCUTANEOUS
  Filled 2018-09-04 (×5): qty 0.4

## 2018-09-04 MED ORDER — SERTRALINE HCL 100 MG PO TABS
100.0000 mg | ORAL_TABLET | Freq: Every day | ORAL | Status: DC
  Administered 2018-09-05: 100 mg via ORAL
  Filled 2018-09-04: qty 1

## 2018-09-04 MED ORDER — ONDANSETRON HCL 4 MG PO TABS: 4.0000 mg | ORAL_TABLET | Freq: Four times a day (QID) | ORAL | Status: DC | PRN

## 2018-09-04 MED ORDER — SODIUM CHLORIDE 0.9 % IV BOLUS
250.0000 mL | Freq: Once | INTRAVENOUS | Status: AC
  Administered 2018-09-04: 250 mL via INTRAVENOUS

## 2018-09-04 MED ORDER — SODIUM CHLORIDE 0.9 % IV BOLUS
500.0000 mL | Freq: Once | INTRAVENOUS | Status: AC
  Administered 2018-09-04: 500 mL via INTRAVENOUS

## 2018-09-04 MED ORDER — IPRATROPIUM BROMIDE 0.02 % IN SOLN
0.5000 mg | Freq: Four times a day (QID) | RESPIRATORY_TRACT | Status: DC
  Administered 2018-09-04: 0.5 mg via RESPIRATORY_TRACT
  Filled 2018-09-04: qty 2.5

## 2018-09-04 MED ORDER — POTASSIUM CHLORIDE CRYS ER 20 MEQ PO TBCR
40.0000 meq | EXTENDED_RELEASE_TABLET | Freq: Once | ORAL | Status: AC
  Administered 2018-09-04: 40 meq via ORAL
  Filled 2018-09-04: qty 2

## 2018-09-04 MED ORDER — IPRATROPIUM BROMIDE 0.02 % IN SOLN
0.5000 mg | Freq: Three times a day (TID) | RESPIRATORY_TRACT | Status: DC
  Administered 2018-09-05: 0.5 mg via RESPIRATORY_TRACT
  Filled 2018-09-04: qty 2.5

## 2018-09-04 MED ORDER — PRAVASTATIN SODIUM 20 MG PO TABS
10.0000 mg | ORAL_TABLET | Freq: Every day | ORAL | Status: DC
  Administered 2018-09-05 – 2018-09-08 (×4): 10 mg via ORAL
  Filled 2018-09-04 (×4): qty 1

## 2018-09-04 MED ORDER — ENSURE ENLIVE PO LIQD: 237.0000 mL | Freq: Two times a day (BID) | ORAL | Status: DC

## 2018-09-04 MED ORDER — ACETAMINOPHEN 650 MG RE SUPP: 650.0000 mg | Freq: Four times a day (QID) | RECTAL | Status: DC | PRN

## 2018-09-04 MED ORDER — SODIUM CHLORIDE 0.9 % IV SOLN
1.0000 g | INTRAVENOUS | Status: DC
  Administered 2018-09-04 – 2018-09-06 (×3): 1 g via INTRAVENOUS
  Filled 2018-09-04 (×3): qty 1

## 2018-09-04 MED ORDER — ONDANSETRON HCL 4 MG/2ML IJ SOLN: 4.0000 mg | Freq: Four times a day (QID) | INTRAMUSCULAR | Status: DC | PRN

## 2018-09-04 MED ORDER — ACETAMINOPHEN 325 MG PO TABS
650.0000 mg | ORAL_TABLET | Freq: Four times a day (QID) | ORAL | Status: DC | PRN
  Administered 2018-09-06 – 2018-09-08 (×4): 650 mg via ORAL
  Filled 2018-09-04 (×4): qty 2

## 2018-09-04 MED ORDER — SODIUM CHLORIDE 0.9 % IV SOLN
INTRAVENOUS | Status: AC
  Administered 2018-09-04: 21:00:00 via INTRAVENOUS

## 2018-09-04 NOTE — ED Provider Notes (Signed)
[facility]-EMERGENCY DEPT Provider Note   CSN: [id number] Arrival date & time: 09/04/18  1245     History   Chief Complaint Chief Complaint  Patient presents with  . Fatigue  . Anorexia    HPI Robin [name] is a 85 y.o. female with a past medical history of hypertension, diverticulosis, GERD, who presents today for evaluation of abdominal pain.  She was seen on 1/2 for cough and shortness of breath and started antibiotics and prednisone.  She smoked about 1/4 pack a day for 50 years.  She reports that she has finished the prednisone and still has antibiotics left.  She reports that she started feeling very fatigued while she was on a cruise prior to [employer].  This started prior to the onset of her cough, which also started on the cruise.  Her cruise was in the [place].  Her cough is not associated with nasal congestion, runny nose or sore throat.  She also reports having no appetite.  This started before she started taking the antibiotics.  She reports that today she started having burning when she pees.  Denies increased frequency or urgency.  She had diarrhea earlier in the week, took 2 pills of imodium and then was constipated.  She used a suppository last night which produced a bowel movement.   HPI  Past Medical History:  Diagnosis Date  . Depression   . Diverticulosis   . GERD (gastroesophageal reflux disease)   . Heart murmur   . Hyperlipemia   . Hypertension   . Irregular heart beat 2014   patient states she has not heard back about echo.   . Osteoporosis     Patient Active Problem List   Diagnosis Date Noted  . Hyponatremia 09/04/2018  . Abdominal pain, epigastric 08/03/2015  . NSAID long-term use 08/03/2015  . Poor appetite 08/03/2015  . CHANGE IN BOWELS 10/28/2010  . DIVERTICULOSIS, COLON, HX OF 10/28/2010  . ABDOMINAL PAIN, PERIUMBILIC 11/05/2007    Past Surgical History:  Procedure Laterality Date  . ABDOMINAL HYSTERECTOMY     both appy and hyst done together  . APPENDECTOMY       OB History   No obstetric history on file.      Home Medications    Prior to Admission medications   Medication Sig Start Date End Date Taking? Authorizing Provider  albuterol (PROVENTIL HFA;VENTOLIN HFA) 108 (90 Base) MCG/ACT inhaler Inhale 2 puffs into the lungs every 4 (four) hours as needed for wheezing or shortness of breath. 08/29/18  Yes Cathren Laine, MD  amLODipine (NORVASC) 2.5 MG tablet Take 2.5 mg by mouth daily.   Yes [provider]  aspirin 81 MG tablet Take 81 mg by mouth daily.   Yes [provider]  doxycycline (VIBRAMYCIN) 100 MG capsule Take 1 capsule (100 mg total) by mouth 2 (two) times daily. 08/30/18  Yes Cathren Laine, MD  fluticasone (FLONASE) 50 MCG/ACT nasal spray Place 1 spray into both nostrils daily.   Yes [provider]  fluticasone furoate-vilanterol (BREO ELLIPTA) 100-25 MCG/INH AEPB Inhale 1 puff into the lungs daily.   Yes [provider]  loperamide (IMODIUM A-D) 2 MG capsule Take 2 mg by mouth as needed for diarrhea or loose stools.   Yes [provider]  lovastatin (MEVACOR) 40 MG tablet Take 40 mg by mouth daily.    Yes [provider]  sertraline (ZOLOFT) 100 MG tablet Take 100 mg by mouth daily.  Yes [provider]  tiotropium (SPIRIVA) 18 MCG inhalation capsule Place 18 mcg into inhaler and inhale daily.     Yes [provider]  traMADol (ULTRAM) 50 MG tablet Take 50 mg by mouth every 6 (six) hours as needed for moderate pain.   Yes [provider]  valsartan-hydrochlorothiazide (DIOVAN-HCT) 320-12.5 MG per tablet Take 1 tablet by mouth daily.     Yes [provider]  Vitamin D, Ergocalciferol, (DRISDOL) 1.25 MG (50000 UT) CAPS capsule Take 50,000 Units by mouth every 7 (seven) days.   Yes [provider]  pantoprazole (PROTONIX) 40 MG tablet Take 1 tablet (40 mg total) by mouth daily. Patient  not taking: Reported on 08/29/2018 08/03/15   Zehr, Princella PellegriniJessica D, PA-C  predniSONE (DELTASONE) 20 MG tablet Take 2 tablets (40 mg total) by mouth daily. Patient not taking: Reported on 09/04/2018 08/30/18   Cathren LaineSteinl, Kevin, MD    Family History Family History  Problem Relation Age of Onset  . Lung cancer Father   . Cancer Sister        biliary    Social History Social History   Tobacco Use  . Smoking status: Former Smoker    Last attempt to quit: 08/28/2008    Years since quitting: 10.0  . Smokeless tobacco: Never Used  Substance Use Topics  . Alcohol use: Yes    Alcohol/week: 7.0 standard drinks    Types: 7 drink(s) per week    Comment: glass of wine daily  . Drug use: No     Allergies   Fosamax [alendronate sodium]; Augmentin [amoxicillin-pot clavulanate]; Hydrocodone; Mobic [meloxicam]; and Sulfonamide derivatives   Review of Systems Review of Systems  Constitutional: Positive for appetite change and fatigue. Negative for activity change, chills, diaphoresis, fever and unexpected weight change.  HENT: Negative for congestion.   Respiratory: Positive for cough. Negative for chest tightness, shortness of breath and wheezing.   Cardiovascular: Negative for chest pain, palpitations and leg swelling.  Gastrointestinal: Positive for abdominal pain, constipation, diarrhea, nausea and vomiting. Negative for blood in stool.  Genitourinary: Positive for dysuria. Negative for frequency and urgency.  Musculoskeletal: Negative for back pain and neck pain.  Skin: Positive for wound (Right anterior shin). Negative for color change.  Neurological: Negative for light-headedness and headaches.  All other systems reviewed and are negative.    Physical Exam Updated Vital Signs BP 139/60   Pulse 89   Temp 97.7 F (36.5 C) (Oral)   Resp (!) 22   Wt 68 kg   SpO2 98%   BMI 26.57 kg/m   Physical Exam Vitals signs and nursing note reviewed.  Constitutional:      General: She is not in  acute distress.    Appearance: She is well-developed. She is not toxic-appearing or diaphoretic.  HENT:     Head: Normocephalic and atraumatic.     Right Ear: Tympanic membrane, ear canal and external ear normal.     Left Ear: External ear normal. There is impacted cerumen.     Nose: Nose normal.     Mouth/Throat:     Mouth: Mucous membranes are moist.  Eyes:     General: No scleral icterus.       Right eye: No discharge.        Left eye: No discharge.     Pupils: Pupils are equal, round, and reactive to light.     Comments: Significant pallor of bilateral conjunctiva.   Neck:     Musculoskeletal:  Full passive range of motion without pain, normal range of motion and neck supple. No neck rigidity or muscular tenderness.     Trachea: No tracheal deviation.  Cardiovascular:     Rate and Rhythm: Normal rate and regular rhythm.     Pulses: Normal pulses.          Radial pulses are 2+ on the right side and 2+ on the left side.       Dorsalis pedis pulses are 2+ on the right side and 2+ on the left side.       Posterior tibial pulses are 2+ on the right side and 2+ on the left side.     Heart sounds: Murmur present. No friction rub. No gallop.   Pulmonary:     Effort: Pulmonary effort is normal. No respiratory distress.     Breath sounds: No stridor. Rhonchi (Right lower field posteriorly. ) present. No wheezing.  Abdominal:     General: Bowel sounds are decreased. There is no distension.     Palpations: Abdomen is soft.     Tenderness: There is no abdominal tenderness. There is no guarding or rebound.     Hernia: No hernia is present.  Musculoskeletal:     Right lower leg: No edema.     Left lower leg: No edema.  Lymphadenopathy:     Cervical: No cervical adenopathy.  Skin:    General: Skin is warm and dry.     Comments: Small wound present on right anterior shin.  Mild surrounding erythema without abnormal fluctuance induration or drainage.  Neurological:     General: No focal  deficit present.     Mental Status: She is alert and oriented to person, place, and time.     Cranial Nerves: No cranial nerve deficit.     Sensory: No sensory deficit.     Motor: No abnormal muscle tone.  Psychiatric:        Mood and Affect: Mood normal.        Behavior: Behavior normal.      ED Treatments / Results  Labs (all labs ordered are listed, but only abnormal results are displayed) Labs Reviewed  COMPREHENSIVE METABOLIC PANEL - Abnormal; Notable for the following components:      Result Value   Sodium 116 (*)    Potassium 3.1 (*)    Chloride 80 (*)    Glucose, Bld 106 (*)    BUN 35 (*)    Calcium 8.8 (*)    All other components within normal limits  CBC WITH DIFFERENTIAL/PLATELET - Abnormal; Notable for the following components:   WBC 15.0 (*)    HCT 35.6 (*)    Neutro Abs 12.6 (*)    Abs Immature Granulocytes 0.13 (*)    All other components within normal limits  URINE CULTURE  LIPASE, BLOOD  MAGNESIUM  URINALYSIS, ROUTINE W REFLEX MICROSCOPIC  CREATININE, URINE, RANDOM  NA AND K (SODIUM & POTASSIUM), RAND UR  OSMOLALITY, URINE  OSMOLALITY  PHOSPHORUS  TYPE AND SCREEN  ABO/RH    EKG EKG Interpretation  Date/Time:  Wednesday September 04 2018 16:02:05 EST Ventricular Rate:  82 PR Interval:    QRS Duration: 92 QT Interval:  396 QTC Calculation: 463 R Axis:   3 Text Interpretation:  Sinus rhythm Atrial premature complex Prolonged PR interval Abnormal R-wave progression, early transition Minimal ST depression Baseline wander in lead(s) V1 Confirmed by Benjiman Core [phone]) on 09/04/2018 6:08:37 PM   Radiology Dg Chest 2  View  Result Date: 09/04/2018 CLINICAL DATA:  Increasing fatigue and loss of appetite, diagnosed with bronchitis last Thursday, history hypertension, former smoker EXAM: CHEST - 2 VIEW COMPARISON:  09/18/2018 FINDINGS: Upper normal heart size. Mediastinal contours and pulmonary vascularity normal. Atherosclerotic calcification aorta.  Lungs appear mildly hyperinflated but clear. Costal cartilaginous calcifications project over the lungs bilaterally No infiltrate, pleural effusion or pneumothorax. Bones demineralized. IMPRESSION: Hyperinflated lungs without infiltrate. Electronically Signed   By: Ulyses SouthwardMark  Boles M.D.   On: 09/04/2018 17:32    Procedures .Critical Care Performed by: Cristina GongHammond, Jamerion Cabello W, PA-C Authorized by: Cristina GongHammond, Claritza July W, PA-C   Critical care provider statement:    Critical care time (minutes):  45   Critical care time was exclusive of:  Separately billable procedures and treating other patients and teaching time   Critical care was necessary to treat or prevent imminent or life-threatening deterioration of the following conditions:  Metabolic crisis and CNS failure or compromise   Critical care was time spent personally by me on the following activities:  Discussions with consultants, evaluation of patient's response to treatment, examination of patient, ordering and performing treatments and interventions, ordering and review of laboratory studies, ordering and review of radiographic studies, pulse oximetry, re-evaluation of patient's condition, obtaining history from patient or surrogate and review of old charts Comments:     Hyponatremia requiring admission   (including critical care time)   Medications Ordered in ED Medications  sodium chloride 0.9 % bolus 500 mL (500 mLs Intravenous New Bag/Given 09/04/18 1856)  sodium chloride 0.9 % bolus 250 mL (0 mLs Intravenous Stopped 09/04/18 1859)  potassium chloride SA (K-DUR,KLOR-CON) CR tablet 40 mEq (40 mEq Oral Given 09/04/18 1903)     Initial Impression / Assessment and Plan / ED Course  I have reviewed the triage vital signs and the nursing notes.  Pertinent labs & imaging results that were available during my care of the patient were reviewed by me and considered in my medical decision making (see chart for details).  Clinical Course as of Sep 04 1937  Wed Sep 04, 2018  1639 Patient reports poor PO intake, has moist muccus membranes, is not tachycardic or hypotensive.  Chart review does not show previous echo in the system.  Will start with 250 cc fluid bolus.    [EH]  1811 Saline ordered.   Sodium(!!): 116 [EH]  1935 Spoke with Dr. Adela Glimpseoutova who will admit patient.    [EH]    Clinical Course User Index [EH] Cristina GongHammond, Dreden Rivere W, PA-C   Patient presents today for evaluation of fatigue and appetite loss.  She was recently seen and diagnosed with suspected acute exacerbation of COPD based on her longstanding smoking history and cough.  She reports that she has gotten significantly worse since then.  X-ray today does not show evidence of pneumonia or other consolidation, is consistent with hyperinflation most likely related to COPD.  Labs were significant for leukocytosis with a white count of 15, she stopped taking prednisone today so I suspect that this is at least in part from prednisone.  Her CMP showed a sodium of 116 with a potassium of 3.1.  Her creatinine is normal, BUN slightly elevated at 35.  This patient was seen as a shared visit with Dr. Rubin PayorPickering.  Given degree of hyponatremia, admission is recommended.   I spoke with Dr. Adela Glimpseoutova who will admit the patient.   Final Clinical Impressions(s) / ED Diagnoses   Final diagnoses:  Hyponatremia  Hypokalemia  Other fatigue    ED Discharge Orders    None       Norman Clay 09/04/18 1941    Benjiman Core, MD 09/04/18 [phone]

## 2018-09-04 NOTE — ED Notes (Signed)
ED TO INPATIENT HANDOFF REPORT  Name/Age/Gender Robin [name] 85 y.o. female  Code Status Advance Directive Documentation     Most Recent Value  Type of Advance Directive  Healthcare Power of Attorney, Living will  Pre-existing out of facility DNR order (yellow form or pink MOST form)  -  "MOST" Form in Place?  -      Home/SNF/Other Home  Chief Complaint fatigue; loss of appetite  Level of Care/Admitting Diagnosis ED Disposition    ED Disposition Condition Comment   Admit  [facility]: [facility] [[id number]]  Level of Care: Stepdown [14]  Admit to SDU based on following criteria: Other see comments  Comments: severe hyponatremia  Diagnosis: Hyponatremia [161096][198519]  Admitting Physician: Therisa DoyneUTOVA, ANASTASSIA [[id number]]  Attending Physician: Therisa DoyneUTOVA, ANASTASSIA [[id number]]  Estimated length of stay: 3 - 4 days  Certification:: I certify this patient will need inpatient services for at least 2 midnights  PT Class (Do Not Modify): Inpatient [101]  PT Acc Code (Do Not Modify): Private [1]       Medical History Past Medical History:  Diagnosis Date  . Depression   . Diverticulosis   . GERD (gastroesophageal reflux disease)   . Heart murmur   . Hyperlipemia   . Hypertension   . Irregular heart beat 2014   patient states she has not heard back about echo.   . Osteoporosis     Allergies Allergies  Allergen Reactions  . Fosamax [Alendronate Sodium]     Poor healing to mouth wound  . Augmentin [Amoxicillin-Pot Clavulanate] Nausea Only    DID THE REACTION INVOLVE: Swelling of the face/tongue/throat, SOB, or low BP? No Sudden or severe rash/hives, skin peeling, or the inside of the mouth or nose? No Did it require medical treatment? No When did it last happen? If all above answers are "NO", may proceed with cephalosporin use.   Marland Kitchen. Hydrocodone Nausea And Vomiting  . Mobic [Meloxicam] Rash  . Sulfonamide Derivatives Rash    IV  Location/Drains/Wounds Patient Lines/Drains/Airways Status   Active Line/Drains/Airways    Name:   Placement date:   Placement time:   Site:   Days:   Peripheral IV 12/19/16 Right Hand   12/19/16    1424    Hand   624   Peripheral IV 09/04/18 Right Forearm   09/04/18    1657    Forearm   less than 1          Labs/Imaging Results for orders placed or performed during the hospital encounter of 09/04/18 (from the past 48 hour(s))  ABO/Rh     Status: None (Preliminary result)   Collection Time: 09/04/18  4:54 PM  Result Value Ref Range   ABO/RH(D)      [facility] [place] NEG Performed at [facility], [address] [place], [place] [address]   Comprehensive metabolic panel     Status: Abnormal   Collection Time: 09/04/18  4:55 PM  Result Value Ref Range   Sodium 116 (LL) 135 - 145 mmol/L    Comment: CRITICAL RESULT CALLED TO, READ BACK BY AND VERIFIED WITH: TIM SMITH,RN [id number] @ 1742 BY J SCOTTON    Potassium 3.1 (L) 3.5 - 5.1 mmol/L   Chloride 80 (L) 98 - 111 mmol/L   CO2 24 22 - 32 mmol/L   Glucose, Bld 106 (H) 70 - 99 mg/dL   BUN 35 (H) 8 - 23 mg/dL   Creatinine, Ser 4.780.80 0.44 - 1.00 mg/dL  Calcium 8.8 (L) 8.9 - 10.3 mg/dL   Total Protein 6.8 6.5 - 8.1 g/dL   Albumin 3.7 3.5 - 5.0 g/dL   AST 25 15 - 41 U/L   ALT 27 0 - 44 U/L   Alkaline Phosphatase 64 38 - 126 U/L   Total Bilirubin 1.2 0.3 - 1.2 mg/dL   GFR calc non Af Amer >60 >60 mL/min   GFR calc Af Amer >60 >60 mL/min   Anion gap 12 5 - 15    Comment: Performed at [facility], [address] [place], [place] [address]  Lipase, blood     Status: None   Collection Time: 09/04/18  4:55 PM  Result Value Ref Range   Lipase 37 11 - 51 U/L    Comment: Performed at [facility], [address], [place], [place] [address]  CBC with Differential     Status: Abnormal   Collection Time: 09/04/18  4:55 PM  Result Value Ref Range   WBC 15.0 (H) 4.0 - 10.5 K/uL   RBC 4.20 3.87 -  5.11 MIL/uL   Hemoglobin 12.6 12.0 - 15.0 g/dL   HCT 69.4 (L) 85.4 - 62.7 %   MCV 84.8 80.0 - 100.0 fL   MCH 30.0 26.0 - 34.0 pg   MCHC 35.4 30.0 - 36.0 g/dL   RDW 03.5 00.9 - 38.1 %   Platelets 321 150 - 400 K/uL   nRBC 0.0 0.0 - 0.2 %   Neutrophils Relative % 84 %   Neutro Abs 12.6 (H) 1.7 - 7.7 K/uL   Lymphocytes Relative 8 %   Lymphs Abs 1.2 0.7 - 4.0 K/uL   Monocytes Relative 7 %   Monocytes Absolute 1.0 0.1 - 1.0 K/uL   Eosinophils Relative 0 %   Eosinophils Absolute 0.0 0.0 - 0.5 K/uL   Basophils Relative 0 %   Basophils Absolute 0.0 0.0 - 0.1 K/uL   Immature Granulocytes 1 %   Abs Immature Granulocytes 0.13 (H) 0.00 - 0.07 K/uL    Comment: Performed at [facility], [address] [place], [place] [address]  Type and screen [facility]     Status: None   Collection Time: 09/04/18  4:55 PM  Result Value Ref Range   ABO/RH(D) O NEG    Antibody Screen NEG    Sample Expiration      09/07/2018 Performed at [facility], [address], [place], [place] [address]   Magnesium     Status: None   Collection Time: 09/04/18  6:49 PM  Result Value Ref Range   Magnesium 1.9 1.7 - 2.4 mg/dL    Comment: Performed at [facility], [address] [place], [place] [address]   Dg Chest 2 View  Result Date: 09/04/2018 CLINICAL DATA:  Increasing fatigue and loss of appetite, diagnosed with bronchitis last Thursday, history hypertension, former smoker EXAM: CHEST - 2 VIEW COMPARISON:  09/18/2018 FINDINGS: Upper normal heart size. Mediastinal contours and pulmonary vascularity normal. Atherosclerotic calcification aorta. Lungs appear mildly hyperinflated but clear. Costal cartilaginous calcifications project over the lungs bilaterally No infiltrate, pleural effusion or pneumothorax. Bones demineralized. IMPRESSION: Hyperinflated lungs without infiltrate. Electronically Signed   By: Ulyses Southward M.D.   On: 09/04/2018  17:32   EKG Interpretation  Date/Time:  Wednesday September 04 2018 16:02:05 EST Ventricular Rate:  82 PR Interval:    QRS Duration: 92 QT Interval:  396 QTC Calculation: 463 R  Axis:   3 Text Interpretation:  Sinus rhythm Atrial premature complex Prolonged PR interval Abnormal R-wave progression, early transition Minimal ST depression Baseline wander in lead(s) V1 Confirmed by Benjiman Core [id number]) on 09/04/2018 6:08:37 PM   Pending Labs Unresulted Labs (From admission, onward)    Start     Ordered   09/04/18 1937  Osmolality, urine  Once,   R     09/04/18 1936   09/04/18 1937  Osmolality  Add-on,   R     09/04/18 1936   09/04/18 1937  Phosphorus  Add-on,   R     09/04/18 1937   09/04/18 1813  Na and K (sodium & potassium), rand urine  Once,   R     09/04/18 1812   09/04/18 1812  Creatinine, urine, random  ONCE - STAT,   STAT     09/04/18 1812   09/04/18 1541  Urine culture  ONCE - STAT,   STAT     09/04/18 1544   09/04/18 1539  Urinalysis, Routine w reflex microscopic  ONCE - STAT,   STAT     09/04/18 1544          Vitals/Pain Today's Vitals   09/04/18 1603 09/04/18 1730 09/04/18 1800 09/04/18 1830  BP: (!) 142/55 (!) 138/37 (!) 122/50 139/60  Pulse: 79 91 82 89  Resp: 18 15 20  (!) 22  Temp: 97.7 F (36.5 C)     TempSrc: Oral     SpO2: 98% 96% 97% 98%  Weight:      PainSc:        Isolation Precautions No active isolations  Medications Medications  sodium chloride 0.9 % bolus 500 mL (500 mLs Intravenous New Bag/Given 09/04/18 1856)  sodium chloride 0.9 % bolus 250 mL (0 mLs Intravenous Stopped 09/04/18 1859)  potassium chloride SA (K-DUR,KLOR-CON) CR tablet 40 mEq (40 mEq Oral Given 09/04/18 1903)    Mobility walks with person assist

## 2018-09-04 NOTE — ED Notes (Signed)
Patient transported to x-ray. ?

## 2018-09-04 NOTE — ED Notes (Signed)
Date and time results received: 09/04/18 1742 (use smartphrase ".now" to insert current time)  Test: Na+ Critical Value: 116  Name of Provider Notified: P.A. Elizabeth  Orders Received? Or Actions Taken?:

## 2018-09-04 NOTE — ED Triage Notes (Signed)
Pt reports that she was seen here on Thursday and dx with Bronchitis. Pt reports beginning abx on Thursday and since Thursday she has had increase fatigue and loss of appetite due to the abx irritating her stomach.

## 2018-09-04 NOTE — Progress Notes (Addendum)
CRITICAL VALUE ALERT  Critical Value:  Serum Osmolality: 248  Date & Time Notied:  09/04/18 @ 23:45  Provider Notified: X. Blount  Orders Received/Actions taken: waiting for orders.

## 2018-09-04 NOTE — H&P (Signed)
Robin SorBarbara M Deshazo ZOX:[id number] DOB: December 02, 1931 DOA: 09/04/2018     PCP: Sigmund HazelMiller, Lisa, MD   Outpatient Specialists:  CARDS:  Dr. Donnie Ahoilley   GI Dr.  Shearon Stalls(  LB) Arlyce DiceKaplan    Patient arrived to ER on 09/04/18 at 1245  Patient coming from: home Lives With family    Chief Complaint:  Chief Complaint  Patient presents with  . Fatigue  . Anorexia    HPI: Robin [name] is a 85 y.o. female with medical history significant of COPD, hyperlipidemia, hypertension, heart murmur GERD, osteoporosis    Presented with decreased appetite and generalized fatigue.  She was seen last week in emergency department for breath chest congestion and wheezing is history of COPD this is similar to COPD exacerbation.  [facility] and referred to ER. Reports [place]  [place]. Reports just decreased appetite but no nausea no vomiting.    At that time she was diagnosed with bronchitis started on prednisone and doxycycline and discharged home She states that doxycycline has been irritating her stomach making it difficult to eat  Her husband has been sick as well with chest congestion.   Patient has inhalers at home that she uses for his COPD but did not seem to make it better.  Denies any fever no sore throat or runny nose no headaches or body aches no chest pain.  She is no longer smoking but used to smoke in the past.  Smoked about a quarter pack a day for 8450 y.   Patient also mentioned that she has been having more dysuria with urination which is new.  After starting antibiotics she had couple episodes of diarrhea she took 2 pills of Imodium and that seemed to have helped now she feels more constipated.  That the use suppositories after which she did have a bowel movement.  Regarding pertinent Chronic problems:  History of hypertension for which she takes Diovan/HCTZ  While in ER: Was found to have sodium of 116 The following Work up has been  ordered so far:  Orders Placed This Encounter  Procedures  . Urine culture  . DG Chest 2 View  . Urinalysis, Routine w reflex microscopic  . Comprehensive metabolic panel  . Lipase, blood  . CBC with Differential  . Creatinine, urine, random  . Na and K (sodium & potassium), rand urine  . Magnesium  . Cardiac monitoring  . Consult to hospitalist  . ED EKG  . Type and screen [facility]  . ABO/Rh  . Saline lock IV  . Seizure precautions     Following Medications were ordered in ER: Medications  sodium chloride 0.9 % bolus 500 mL (500 mLs Intravenous New Bag/Given 09/04/18 1856)  sodium chloride 0.9 % bolus 250 mL (0 mLs Intravenous Stopped 09/04/18 1859)  potassium chloride SA (K-DUR,KLOR-CON) CR tablet 40 mEq (40 mEq Oral Given 09/04/18 1903)    Significant initial  Findings: Abnormal Labs Reviewed  COMPREHENSIVE METABOLIC PANEL - Abnormal; Notable for the following components:      Result Value   Sodium 116 (*)    Potassium 3.1 (*)    Chloride 80 (*)    Glucose, Bld 106 (*)    BUN 35 (*)    Calcium 8.8 (*)    All other components within normal limits  CBC WITH DIFFERENTIAL/PLATELET - Abnormal; Notable for the following components:   WBC 15.0 (*)  HCT 35.6 (*)    Neutro Abs 12.6 (*)    Abs Immature Granulocytes 0.13 (*)    All other components within normal limits     Lactic Acid, Venous No results found for: LATICACIDVEN  Na 116 K 3.1  Cr stable,   Lab Results  Component Value Date   CREATININE 0.80 09/04/2018   CREATININE 0.8 11/05/2007      WBC  15  HG/HCT   stable,       Component Value Date/Time   HGB 12.6 09/04/2018 1655   HCT 35.6 (L) 09/04/2018 1655         UA   pyuria rare bacteria  CT HEAD   CXR -  NON acute hyperinflated lungs    ECG:  Personally reviewed by me showing: HR : 82 Rhythm:  NSR,prolonged PR Slight ST depression QTC 462    ED Triage Vitals  Enc Vitals Group     BP 09/04/18 1253 (!) 127/50      Pulse Rate 09/04/18 1253 84     Resp 09/04/18 1253 18     Temp 09/04/18 1253 (!) 97 F (36.1 C)     Temp Source 09/04/18 1253 Oral     SpO2 09/04/18 1253 99 %     Weight 09/04/18 1254 150 lb (68 kg)     Height --      Head Circumference --      Peak Flow --      Pain Score 09/04/18 1253 6     Pain Loc --      Pain Edu? --      Excl. in GC? --   TMAX(24)@       Latest  Blood pressure 139/60, pulse 89, temperature 97.7 F (36.5 C), temperature source Oral, resp. rate (!) 22, weight 68 kg, SpO2 98 %.    Hospitalist was called for admission for Hyponatremia   Review of Systems:    Pertinent positives include:  fatigue,   Constitutional:  No weight loss, night sweats, Fevers, chills,weight loss  HEENT:  No headaches, Difficulty swallowing,Tooth/dental problems,Sore throat,  No sneezing, itching, ear ache, nasal congestion, post nasal drip,  Cardio-vascular:  No chest pain, Orthopnea, PND, anasarca, dizziness, palpitations.no Bilateral lower extremity swelling  GI:  No heartburn, indigestion, abdominal pain, nausea, vomiting, diarrhea, change in bowel habits, loss of appetite, melena, blood in stool, hematemesis Resp:  no shortness of breath at rest. No dyspnea on exertion, No excess mucus, no productive cough, No non-productive cough, No coughing up of blood.No change in color of mucus.No wheezing. Skin:  no rash or lesions. No jaundice GU:  no dysuria, change in color of urine, no urgency or frequency. No straining to urinate.  No flank pain.  Musculoskeletal:  No joint pain or no joint swelling. No decreased range of motion. No back pain.  Psych:  No change in mood or affect. No depression or anxiety. No memory loss.  Neuro: no localizing neurological complaints, no tingling, no weakness, no double vision, no gait abnormality, no slurred speech, no confusion  All systems reviewed and apart from HOPI all are negative  Past Medical History:   Past Medical History:    Diagnosis Date  . Depression   . Diverticulosis   . GERD (gastroesophageal reflux disease)   . Heart murmur   . Hyperlipemia   . Hypertension   . Irregular heart beat 2014   patient states she has not heard back about echo.   . Osteoporosis  Past Surgical History:  Procedure Laterality Date  . ABDOMINAL HYSTERECTOMY     both appy and hyst done together  . APPENDECTOMY      Social History:  Ambulatory  independently       reports that she quit smoking about 10 years ago. She has never used smokeless tobacco. She reports current alcohol use of about 7.0 standard drinks of alcohol per week. She reports that she does not use drugs.     Family History:   Family History  Problem Relation Age of Onset  . Lung cancer Father   . Cancer Sister        biliary    Allergies: Allergies  Allergen Reactions  . Fosamax [Alendronate Sodium]     Poor healing to mouth wound  . Augmentin [Amoxicillin-Pot Clavulanate] Nausea Only    DID THE REACTION INVOLVE: Swelling of the face/tongue/throat, SOB, or low BP? No Sudden or severe rash/hives, skin peeling, or the inside of the mouth or nose? No Did it require medical treatment? No When did it last happen? If all above answers are "NO", may proceed with cephalosporin use.   Marland Kitchen Hydrocodone Nausea And Vomiting  . Mobic [Meloxicam] Rash  . Sulfonamide Derivatives Rash     Prior to Admission medications   Medication Sig Start Date End Date Taking? Authorizing Provider  albuterol (PROVENTIL HFA;VENTOLIN HFA) 108 (90 Base) MCG/ACT inhaler Inhale 2 puffs into the lungs every 4 (four) hours as needed for wheezing or shortness of breath. 08/29/18  Yes Cathren Laine, MD  amLODipine (NORVASC) 2.5 MG tablet Take 2.5 mg by mouth daily.   Yes [provider]  aspirin 81 MG tablet Take 81 mg by mouth daily.   Yes [provider]  doxycycline (VIBRAMYCIN) 100 MG capsule Take 1 capsule (100 mg total) by mouth 2 (two)  times daily. 08/30/18  Yes Cathren Laine, MD  fluticasone (FLONASE) 50 MCG/ACT nasal spray Place 1 spray into both nostrils daily.   Yes [provider]  fluticasone furoate-vilanterol (BREO ELLIPTA) 100-25 MCG/INH AEPB Inhale 1 puff into the lungs daily.   Yes [provider]  loperamide (IMODIUM A-D) 2 MG capsule Take 2 mg by mouth as needed for diarrhea or loose stools.   Yes [provider]  lovastatin (MEVACOR) 40 MG tablet Take 40 mg by mouth daily.    Yes [provider]  sertraline (ZOLOFT) 100 MG tablet Take 100 mg by mouth daily.     Yes [provider]  tiotropium (SPIRIVA) 18 MCG inhalation capsule Place 18 mcg into inhaler and inhale daily.     Yes [provider]  traMADol (ULTRAM) 50 MG tablet Take 50 mg by mouth every 6 (six) hours as needed for moderate pain.   Yes [provider]  valsartan-hydrochlorothiazide (DIOVAN-HCT) 320-12.5 MG per tablet Take 1 tablet by mouth daily.     Yes [provider]  Vitamin D, Ergocalciferol, (DRISDOL) 1.25 MG (50000 UT) CAPS capsule Take 50,000 Units by mouth every 7 (seven) days.   Yes [provider]  pantoprazole (PROTONIX) 40 MG tablet Take 1 tablet (40 mg total) by mouth daily. Patient not taking: Reported on 08/29/2018 08/03/15   Zehr, Princella Pellegrini, PA-C  predniSONE (DELTASONE) 20 MG tablet Take 2 tablets (40 mg total) by mouth daily. Patient not taking: Reported on 09/04/2018 08/30/18   Cathren Laine, MD   Physical Exam: Blood pressure 139/60, pulse 89, temperature 97.7 F (36.5 C), temperature source Oral, resp. rate Marland Kitchen)  22, weight 68 kg, SpO2 98 %. 1. General:  in No Acute distress   Chronically ill -appearing 2. Psychological: Alert and   Oriented 3. Head/ENT:     Dry Mucous Membranes                          Head Non traumatic, neck supple                            Poor Dentition 4. SKIN:  decreased Skin turgor,  Skin clean Dry and intact no rash 5. Heart:  Regular rate and rhythm systolic  Murmur, no Rub or gallop 6. Lungs: Clear to auscultation bilaterally, no wheezes or crackles   7. Abdomen: Soft, non-tender, Non distended  bowel sounds present 8. Lower extremities: no clubbing, cyanosis, or  edema 9. Neurologically Grossly intact, moving all 4 extremities equally  10. MSK: Normal range of motion   LABS:     Recent Labs  Lab 09/04/18 1655  WBC 15.0*  NEUTROABS 12.6*  HGB 12.6  HCT 35.6*  MCV 84.8  PLT 321   Basic Metabolic Panel: Recent Labs  Lab 09/04/18 1655 09/04/18 1849  NA 116*  --   K 3.1*  --   CL 80*  --   CO2 24  --   GLUCOSE 106*  --   BUN 35*  --   CREATININE 0.80  --   CALCIUM 8.8*  --   MG  --  1.9      Recent Labs  Lab 09/04/18 1655  AST 25  ALT 27  ALKPHOS 64  BILITOT 1.2  PROT 6.8  ALBUMIN 3.7   Recent Labs  Lab 09/04/18 1655  LIPASE 37   No results for input(s): AMMONIA in the last 168 hours.    HbA1C: No results for input(s): HGBA1C in the last 72 hours. CBG: No results for input(s): GLUCAP in the last 168 hours.    Urine analysis: No results found for: COLORURINE, APPEARANCEUR, LABSPEC, PHURINE, GLUCOSEU, HGBUR, BILIRUBINUR, KETONESUR, PROTEINUR, UROBILINOGEN, NITRITE, LEUKOCYTESUR     Cultures: No results found for: SDES, SPECREQUEST, CULT, REPTSTATUS   Radiological Exams on Admission: Dg Chest 2 View  Result Date: 09/04/2018 CLINICAL DATA:  Increasing fatigue and loss of appetite, diagnosed with bronchitis last Thursday, history hypertension, former smoker EXAM: CHEST - 2 VIEW COMPARISON:  09/18/2018 FINDINGS: Upper normal heart size. Mediastinal contours and pulmonary vascularity normal. Atherosclerotic calcification aorta. Lungs appear mildly hyperinflated but clear. Costal cartilaginous calcifications project over the lungs bilaterally No infiltrate, pleural effusion or pneumothorax. Bones demineralized. IMPRESSION: Hyperinflated lungs without infiltrate. Electronically  Signed   By: Ulyses Southward M.D.   On: 09/04/2018 17:32    Chart has been reviewed    Assessment/Plan   85 y.o. female with medical history significant of COPD, hyperlipidemia, hypertension, heart murmur GERD, osteoporosis  Admitted for hyponatremia  Present on Admission: . Hyponatremia - in the setting of decreased PO intake hypotension We will rehydrate full urine electrolytes, follow sodium levels and avoid overcorrection, Check TSH and hold hydrochlorothiazide Call nephrology if patient does not improve . Poor appetite -the setting of recent bronchitis.  Encourage p.o. intake . Dehydration -rehydrate and check orthostatics prior to discharge . Hypokalemia -replaced in ER will repeat and replace as needed check magnesium level  . COPD (chronic obstructive pulmonary disease) (HCC) -chronic currently tobacco free.  Will make sure has PRN inhalers.  Recently finished steroid course.  Currently appears to be at baseline . Pyuria -patient with dysuria, with what is possibly partially treated UTI given recent doxycycline use.  Treat with  Rocephin await results of urine culture History of hypertension Norvasc on hold given soft blood pressures History of hyperlipidemia continue lovastatin History of depression continue Zoloft  Other plan as per orders.  DVT prophylaxis:   Lovenox     Code Status:  Limited Code but no CPR no Intubation  as per patient    I had personally discussed CODE STATUS with patient and family  I had spent 15 min discussing goals of care and CODE STATUS  Family Communication:   Family  at  Bedside  plan of care was discussed with step- Son,   Husband,  Disposition Plan:     To home once workup is complete and patient is stable                    Would benefit from PT/OT eval prior to DC  Ordered                     Consults called: none  Admission status:   inpatient     Expect 2 midnight stay secondary to severity of patient's current illness including    hemodynamic instability despite optimal treatment ( hypotension )  Severe lab/radiological abnormalities including:    hyponatremia and extensive comorbidities including:    COPD   That are currently affecting medical management.   I expect  patient to be hospitalized for 2 midnights requiring inpatient medical care.  Patient is at high risk for adverse outcome (such as loss of life or disability) if not treated.  Indication for inpatient stay as follows:  Hemodynamic instability despite maximal medical therapy,     Need for IV antibiotics, patient had trouble tolerating p.o. antibiotics at home IV fluids leading to nausea and inability to tolerate p.o. resulting in dehydration    Level of care        SDU tele indefinitely please discontinue once patient no longer qualifies    Arnulfo Batson 09/04/2018, 10:27 PM    [facility]      after 2 AM please page floor coverage PA If 7AM-7PM, please contact the day team taking care of the patient  [email or web address]

## 2018-09-05 DIAGNOSIS — J438 Other emphysema: Secondary | ICD-10-CM

## 2018-09-05 DIAGNOSIS — E440 Moderate protein-calorie malnutrition: Secondary | ICD-10-CM

## 2018-09-05 LAB — CBC
HCT: 32.7 % — ABNORMAL LOW (ref 36.0–46.0)
Hemoglobin: 11.4 g/dL — ABNORMAL LOW (ref 12.0–15.0)
MCH: 29.9 pg (ref 26.0–34.0)
MCHC: 34.9 g/dL (ref 30.0–36.0)
MCV: 85.8 fL (ref 80.0–100.0)
Platelets: 291 10*3/uL (ref 150–400)
RBC: 3.81 MIL/uL — ABNORMAL LOW (ref 3.87–5.11)
RDW: 13.1 % (ref 11.5–15.5)
WBC: 11.7 10*3/uL — ABNORMAL HIGH (ref 4.0–10.5)
nRBC: 0 % (ref 0.0–0.2)

## 2018-09-05 LAB — COMPREHENSIVE METABOLIC PANEL
ALT: 22 U/L (ref 0–44)
AST: 19 U/L (ref 15–41)
Albumin: 3.1 g/dL — ABNORMAL LOW (ref 3.5–5.0)
Alkaline Phosphatase: 54 U/L (ref 38–126)
Anion gap: 10 (ref 5–15)
BUN: 24 mg/dL — ABNORMAL HIGH (ref 8–23)
CO2: 22 mmol/L (ref 22–32)
CREATININE: 0.63 mg/dL (ref 0.44–1.00)
Calcium: 7.8 mg/dL — ABNORMAL LOW (ref 8.9–10.3)
Chloride: 88 mmol/L — ABNORMAL LOW (ref 98–111)
GFR calc Af Amer: >60 mL/min (ref >60)
GFR calc non Af Amer: >60 mL/min (ref >60)
Glucose, Bld: 92 mg/dL (ref 70–99)
Potassium: 3.3 mmol/L — ABNORMAL LOW (ref 3.5–5.1)
Sodium: 120 mmol/L — ABNORMAL LOW (ref 135–145)
Total Bilirubin: 0.9 mg/dL (ref 0.3–1.2)
Total Protein: 5.6 g/dL — ABNORMAL LOW (ref 6.5–8.1)

## 2018-09-05 LAB — TSH: TSH: 0.459 u[IU]/mL (ref 0.350–4.500)

## 2018-09-05 LAB — TROPONIN I
Troponin I: <0.03 ng/mL (ref <0.03)
Troponin I: <0.03 ng/mL (ref <0.03)

## 2018-09-05 LAB — PHOSPHORUS: Phosphorus: 3 mg/dL (ref 2.5–4.6)

## 2018-09-05 LAB — MAGNESIUM: Magnesium: 1.8 mg/dL (ref 1.7–2.4)

## 2018-09-05 LAB — OSMOLALITY, URINE: Osmolality, Ur: 522 mOsm/kg (ref 300–900)

## 2018-09-05 MED ORDER — HYDRALAZINE HCL 20 MG/ML IJ SOLN: 5.0000 mg | Freq: Four times a day (QID) | INTRAMUSCULAR | Status: DC | PRN

## 2018-09-05 MED ORDER — ADULT MULTIVITAMIN W/MINERALS CH
1.0000 | ORAL_TABLET | Freq: Every day | ORAL | Status: DC
  Administered 2018-09-05 – 2018-09-09 (×5): 1 via ORAL
  Filled 2018-09-05 (×5): qty 1

## 2018-09-05 MED ORDER — MAGNESIUM SULFATE IN D5W 1-5 GM/100ML-% IV SOLN
1.0000 g | Freq: Once | INTRAVENOUS | Status: AC
  Administered 2018-09-05: 1 g via INTRAVENOUS
  Filled 2018-09-05: qty 100

## 2018-09-05 MED ORDER — SODIUM CHLORIDE 0.9 % IV SOLN
INTRAVENOUS | Status: AC
  Administered 2018-09-05 (×2): via INTRAVENOUS

## 2018-09-05 MED ORDER — BOOST / RESOURCE BREEZE PO LIQD CUSTOM
1.0000 | Freq: Two times a day (BID) | ORAL | Status: DC
  Administered 2018-09-07 – 2018-09-09 (×2): 1 via ORAL

## 2018-09-05 MED ORDER — ORAL CARE MOUTH RINSE
15.0000 mL | Freq: Two times a day (BID) | OROMUCOSAL | Status: DC
  Administered 2018-09-05 – 2018-09-07 (×5): 15 mL via OROMUCOSAL

## 2018-09-05 MED ORDER — IPRATROPIUM BROMIDE 0.02 % IN SOLN
0.5000 mg | Freq: Two times a day (BID) | RESPIRATORY_TRACT | Status: DC
  Administered 2018-09-05 – 2018-09-06 (×3): 0.5 mg via RESPIRATORY_TRACT
  Filled 2018-09-05 (×3): qty 2.5

## 2018-09-05 MED ORDER — POTASSIUM CHLORIDE CRYS ER 20 MEQ PO TBCR
40.0000 meq | EXTENDED_RELEASE_TABLET | Freq: Once | ORAL | Status: AC
  Administered 2018-09-05: 40 meq via ORAL
  Filled 2018-09-05: qty 2

## 2018-09-05 NOTE — Progress Notes (Signed)
PROGRESS NOTE  Robin SorBarbara M Rhoads ZOX:[id number] DOB: 02/14/1932 DOA: 09/04/2018 PCP: Sigmund HazelMiller, Lisa, MD  HPI/Recap of past 24 hours:  Reports no appetite, intermittent dry cough which has improved  Remain feeling weak Husband at bedside  Assessment/Plan: Active Problems:   Poor appetite   Hyponatremia   Dehydration   Hypokalemia   COPD (chronic obstructive pulmonary disease) (HCC)   Pyuria  Hyponatremia -likely due to dehydration/poor oral intake -does not appear to have confusion -continue hydration, slowly improving  Hypokalemia/hypomagnesemia k 3.3, mag 1.8 Replace k/mag, repeat lab in am  Pyuria, urine culture pending, continue Rocephin  COPD/recent treated for bronchitis with doxycycline and prednisone -Chest x-ray no acute findings -no wheezing, no hypoxia, monitor  HTN: home bp meds held on admission in the setting of dehydration, likely able to resume tomorrow  Code Status: partial   Family Communication: patient , husband at bedside, son Onalee HuaDavid over the phone  Disposition Plan: remain in stepdown, sodium remain critically low   Consultants:  none  Procedures:  none  Antibiotics:  rocephin   Objective: BP (!) 171/156   Pulse 88   Temp 97.7 F (36.5 C) (Oral)   Resp 14   Ht 5\' 3"  (1.6 m)   Wt 68.8 kg   SpO2 98%   BMI 26.87 kg/m   Intake/Output Summary (Last 24 hours) at 09/05/2018 0803 Last data filed at 09/05/2018 0600 Gross per 24 hour  Intake 1009.67 ml  Output 250 ml  Net 759.67 ml   Filed Weights   09/04/18 1254 09/04/18 2240  Weight: 68 kg 68.8 kg    Exam: Patient is examined daily including today on 09/05/2018, exams remain the same as of yesterday except that has changed    General:  NAD  Cardiovascular: RRR, + murmur (reports chronic)  Respiratory: CTABL  Abdomen: Soft/ND/NT, positive BS  Musculoskeletal: No Edema  Neuro: alert, oriented   Data Reviewed: Basic Metabolic Panel: Recent Labs  Lab 09/04/18 1655  09/04/18 1849 09/04/18 2058 09/05/18 0240  NA 116*  --   --  120*  K 3.1*  --   --  3.3*  CL 80*  --   --  88*  CO2 24  --   --  22  GLUCOSE 106*  --   --  92  BUN 35*  --   --  24*  CREATININE 0.80  --   --  0.63  CALCIUM 8.8*  --   --  7.8*  MG  --  1.9  --  1.8  PHOS  --   --  3.3 3.0   Liver Function Tests: Recent Labs  Lab 09/04/18 1655 09/05/18 0240  AST 25 19  ALT 27 22  ALKPHOS 64 54  BILITOT 1.2 0.9  PROT 6.8 5.6*  ALBUMIN 3.7 3.1*   Recent Labs  Lab 09/04/18 1655  LIPASE 37   No results for input(s): AMMONIA in the last 168 hours. CBC: Recent Labs  Lab 09/04/18 1655 09/05/18 0240  WBC 15.0* 11.7*  NEUTROABS 12.6*  --   HGB 12.6 11.4*  HCT 35.6* 32.7*  MCV 84.8 85.8  PLT 321 291   Cardiac Enzymes:   Recent Labs  Lab 09/05/18 0240  TROPONINI <0.03   BNP (last 3 results) No results for input(s): BNP in the last 8760 hours.  ProBNP (last 3 results) No results for input(s): PROBNP in the last 8760 hours.  CBG: No results for input(s): GLUCAP in the last 168 hours.  Recent  Results (from the past 240 hour(s))  MRSA PCR Screening     Status: None   Collection Time: 09/04/18  8:57 PM  Result Value Ref Range Status   MRSA by PCR NEGATIVE NEGATIVE Final    Comment:        The GeneXpert MRSA Assay (FDA approved for NASAL specimens only), is one component of a comprehensive MRSA colonization surveillance program. It is not intended to diagnose MRSA infection nor to guide or monitor treatment for MRSA infections. Performed at [facility], [address], [place], [place] [address]      Studies: Dg Chest 2 View  Result Date: 09/04/2018 CLINICAL DATA:  Increasing fatigue and loss of appetite, diagnosed with bronchitis last Thursday, history hypertension, former smoker EXAM: CHEST - 2 VIEW COMPARISON:  09/18/2018 FINDINGS: Upper normal heart size. Mediastinal contours and pulmonary vascularity normal. Atherosclerotic  calcification aorta. Lungs appear mildly hyperinflated but clear. Costal cartilaginous calcifications project over the lungs bilaterally No infiltrate, pleural effusion or pneumothorax. Bones demineralized. IMPRESSION: Hyperinflated lungs without infiltrate. Electronically Signed   By: Ulyses Southward M.D.   On: 09/04/2018 17:32    Scheduled Meds: . aspirin EC  81 mg Oral Daily  . enoxaparin (LOVENOX) injection  40 mg Subcutaneous Q24H  . feeding supplement (ENSURE ENLIVE)  237 mL Oral BID BM  . fluticasone furoate-vilanterol  1 puff Inhalation Daily  . ipratropium  0.5 mg Nebulization TID  . mouth rinse  15 mL Mouth Rinse BID  . potassium chloride  40 mEq Oral Once  . pravastatin  10 mg Oral q1800  . sertraline  100 mg Oral Daily    Continuous Infusions: . cefTRIAXone (ROCEPHIN)  IV Stopped (09/04/18 2330)  . magnesium sulfate 1 - 4 g bolus IVPB       Time spent: I have personally reviewed and interpreted on  09/05/2018 daily labs, tele strips, imagings as discussed above under date review session and assessment and plans.  I reviewed all nursing notes, pharmacy notes, vitals, pertinent old records  I have discussed plan of care as described above with RN , patient and family on 09/05/2018   Albertine Grates MD, PhD  [facility] Pager [phone]. If 7PM-7AM, please contact night-coverage at [email or web address], password [facility] 09/05/2018, 8:03 AM  LOS: 1 day

## 2018-09-05 NOTE — Progress Notes (Signed)
Initial Nutrition Assessment  DOCUMENTATION CODES:   Non-severe (moderate) malnutrition in context of acute illness/injury  INTERVENTION:  - Will d/c Ensure Enlive. - Will order Boost Breeze BID, each supplement provides 250 kcal and 9 grams of protein. - Will order daily multivitamin with minerals. - Continue to encourage PO intakes.    NUTRITION DIAGNOSIS:   Moderate Malnutrition related to acute illness as evidenced by meal completion < 50%, energy intake < 75% for > 7 days.  GOAL:   Patient will meet greater than or equal to 90% of their needs  MONITOR:   PO intake, Supplement acceptance, Weight trends, Labs  REASON FOR ASSESSMENT:   Malnutrition Screening Tool, Consult Malnutrition Eval  ASSESSMENT:   85 y.o. female with medical history significant of COPD, hyperlipidemia, HTN, heart murmur, GERD, and osteoporosis. She presented to the ED with decreased appetite and generalized fatigue. Her husband has been sick as well with chest congestion.  BMI indicates overweight status, appropriate for age. No intakes documented since admission. Patient very drowsy during RD visit and husband, who was at bedside, provided nearly all information (although it was tough to gather much detail from him).   Patient was in her normal state of health, eating as usual, until about 10 days PTA. They had gone on a cruise and for the last 4 days of the cruise patient was unable to eat anything and was feeling very weak. She never experienced abdominal pain or vomiting but was having dry heaving. They returned from the trip on 1/2 and since that time patient was able to consume some liquids, but it does not sound like she had any solid foods.  This AM she was able to eat 1/2 of an English muffin with jelly and peanut butter without issue or discomfort. Ordered lunch per patient request: [place] ice, sweet tea, and a baked potato with butter.  Per chart review, current weight is 151 lb and weight  on 08/02/18 was 159 lb. This indicates 8 lb weight loss (5% body weight) in the past 1 month. She weighed 155 lb on 1/2 which indicates 4 lb weight loss (2.5% body weight) in the past week.    Medications reviewed; 1 mg IV Mg sulfate x1 run 1/9, 40 mEq K-dur x1 dose 1/8 and x1 dose 1/9. Labs reviewed; Na: 120 mmol/L, K: 3.3 mmol/L, Cl: 88 mmol/L, BUN: 24 mg/dL, Ca: 7.8 mg/dL. IVF; NS @ 75 mL/hr.      NUTRITION - FOCUSED PHYSICAL EXAM:  Completed; no muscle and no fat wasting.   Diet Order:   Diet Order            Diet Heart Room service appropriate? Yes; Fluid consistency: Thin  Diet effective now              EDUCATION NEEDS:   No education needs have been identified at this time  Skin:  Skin Assessment: Reviewed RN Assessment  Last BM:  1/9  Height:   Ht Readings from Last 1 Encounters:  09/04/18 5\' 3"  (1.6 m)    Weight:   Wt Readings from Last 1 Encounters:  09/04/18 68.8 kg    Ideal Body Weight:  52.27 kg  BMI:  Body mass index is 26.87 kg/m.  Estimated Nutritional Needs:   Kcal:  1720-1930 kcal  Protein:  82-90 grams  Fluid:  >/= 1.7 L/day     Trenton Gammon, MS, RD, LDN, [facility] Inpatient Clinical Dietitian Pager # [phone] After hours/weekend pager # [phone]

## 2018-09-05 NOTE — Evaluation (Signed)
Physical Therapy Evaluation Patient Details Name: Robin [name] MRN: [id number] DOB: 07/17/1932 Today's Date: 09/05/2018   History of Present Illness  85 y.o. female with medical history significant of COPD, hyperlipidemia, hypertension, heart murmur GERD, osteoporosis and presented to ED with generalized fatigue; found to have hyponatremia  Clinical Impression  Pt admitted with above diagnosis. Pt currently with functional limitations due to the deficits listed below (see PT Problem List).  Pt will benefit from skilled PT to increase their independence and safety with mobility to allow discharge to the venue listed below.  Pt reports generalized weakness and fatigue.  Pt assisted to [facility] and then recliner using RW today.  Pt and spouse anticipate pt to d/c home as pt was independent prior to admission.     Follow Up Recommendations Home health PT;Supervision/Assistance - 24 hour    Equipment Recommendations  None recommended by PT    Recommendations for Other Services       Precautions / Restrictions Precautions Precautions: Fall      Mobility  Bed Mobility Overal bed mobility: Needs Assistance Bed Mobility: Supine to Sit     Supine to sit: Mod assist;+2 for safety/equipment     General bed mobility comments: assist to scoot to EOB  Transfers Overall transfer level: Needs assistance Equipment used: Rolling walker (2 wheeled) Transfers: Sit to/from Stand Sit to Stand: Min assist;+2 safety/equipment         General transfer comment: verbal cues for safe technique and hand placement, pivoted bed to [facility] and then BSC to recliner  Ambulation/Gait             General Gait Details: deferred today as pt fatigued from transfers  Stairs            Wheelchair Mobility    Modified Rankin (Stroke Patients Only)       Balance Overall balance assessment: Needs assistance         Standing balance support: Bilateral upper extremity supported Standing  balance-Leahy Scale: Poor Standing balance comment: requires UE support                             Pertinent Vitals/Pain Pain Assessment: No/denies pain  VSS throughout session    Home Living Family/patient expects to be discharged to:: Private residence Living Arrangements: Spouse/significant other;Children   Type of Home: House Home Access: Stairs to enter   Secretary/administrator of Steps: 1 Home Layout: One level Home Equipment: Environmental consultant - 2 wheels;Walker - 4 wheels      Prior Function Level of Independence: Independent         Comments: just returned from cruise     Hand Dominance        Extremity/Trunk Assessment        Lower Extremity Assessment Lower Extremity Assessment: Generalized weakness       Communication   Communication: No difficulties  Cognition Arousal/Alertness: Awake/alert Behavior During Therapy: WFL for tasks assessed/performed Overall Cognitive Status: Within Functional Limits for tasks assessed                                        General Comments      Exercises     Assessment/Plan    PT Assessment Patient needs continued PT services  PT Problem List Decreased strength;Decreased mobility;Decreased activity tolerance;Decreased balance;Decreased knowledge of use of DME  PT Treatment Interventions DME instruction;Gait training;Therapeutic activities;Functional mobility training;Balance training;Stair training;Therapeutic exercise;Patient/family education    PT Goals (Current goals can be found in the Care Plan section)  Acute Rehab PT Goals PT Goal Formulation: With patient/family Time For Goal Achievement: 09/19/18 Potential to Achieve Goals: Good    Frequency Min 3X/week   Barriers to discharge        Co-evaluation               AM-PAC PT "6 Clicks" Mobility  Outcome Measure Help needed turning from your back to your side while in a flat bed without using bedrails?: A  Little Help needed moving from lying on your back to sitting on the side of a flat bed without using bedrails?: A Lot Help needed moving to and from a bed to a chair (including a wheelchair)?: A Little Help needed standing up from a chair using your arms (e.g., wheelchair or bedside chair)?: A Little Help needed to walk in hospital room?: A Lot Help needed climbing 3-5 steps with a railing? : A Lot 6 Click Score: 15    End of Session Equipment Utilized During Treatment: Gait belt Activity Tolerance: Patient tolerated treatment well Patient left: in chair;with call bell/phone within reach;with chair alarm set Nurse Communication: Mobility status PT Visit Diagnosis: Muscle weakness (generalized) (M62.81);Difficulty in walking, not elsewhere classified (R26.2)    Time: 1950-9326 PT Time Calculation (min) (ACUTE ONLY): 25 min   Charges:   PT Evaluation $PT Eval Moderate Complexity: 1 Mod         Zenovia Jarred, PT, DPT Acute Rehabilitation Services Office: [phone] Pager: [phone]  Maida Sale E 09/05/2018, 2:00 PM

## 2018-09-06 DIAGNOSIS — N390 Urinary tract infection, site not specified: Secondary | ICD-10-CM

## 2018-09-06 LAB — BASIC METABOLIC PANEL
Anion gap: 9 (ref 5–15)
BUN: 19 mg/dL (ref 8–23)
CO2: 20 mmol/L — ABNORMAL LOW (ref 22–32)
Calcium: 7.8 mg/dL — ABNORMAL LOW (ref 8.9–10.3)
Chloride: 93 mmol/L — ABNORMAL LOW (ref 98–111)
Creatinine, Ser: 0.65 mg/dL (ref 0.44–1.00)
GFR calc non Af Amer: >60 mL/min (ref >60)
Glucose, Bld: 111 mg/dL — ABNORMAL HIGH (ref 70–99)
Potassium: 3.9 mmol/L (ref 3.5–5.1)
SODIUM: 122 mmol/L — AB (ref 135–145)

## 2018-09-06 LAB — CBC WITH DIFFERENTIAL/PLATELET
Abs Immature Granulocytes: 0.09 10*3/uL — ABNORMAL HIGH (ref 0.00–0.07)
BASOS ABS: 0 10*3/uL (ref 0.0–0.1)
Basophils Relative: 0 %
Eosinophils Absolute: 0.1 10*3/uL (ref 0.0–0.5)
Eosinophils Relative: 1 %
HCT: 34.2 % — ABNORMAL LOW (ref 36.0–46.0)
Hemoglobin: 11.6 g/dL — ABNORMAL LOW (ref 12.0–15.0)
IMMATURE GRANULOCYTES: 1 %
LYMPHS PCT: 14 %
Lymphs Abs: 1.6 10*3/uL (ref 0.7–4.0)
MCH: 29.1 pg (ref 26.0–34.0)
MCHC: 33.9 g/dL (ref 30.0–36.0)
MCV: 85.7 fL (ref 80.0–100.0)
Monocytes Absolute: 0.9 10*3/uL (ref 0.1–1.0)
Monocytes Relative: 8 %
NRBC: 0 % (ref 0.0–0.2)
Neutro Abs: 8.6 10*3/uL — ABNORMAL HIGH (ref 1.7–7.7)
Neutrophils Relative %: 76 %
Platelets: 358 10*3/uL (ref 150–400)
RBC: 3.99 MIL/uL (ref 3.87–5.11)
RDW: 13.2 % (ref 11.5–15.5)
WBC: 11.3 10*3/uL — ABNORMAL HIGH (ref 4.0–10.5)

## 2018-09-06 LAB — MAGNESIUM: Magnesium: 2.1 mg/dL (ref 1.7–2.4)

## 2018-09-06 MED ORDER — FLUTICASONE PROPIONATE 50 MCG/ACT NA SUSP
2.0000 | Freq: Every day | NASAL | Status: DC
  Administered 2018-09-06 – 2018-09-09 (×4): 2 via NASAL
  Filled 2018-09-06: qty 16

## 2018-09-06 MED ORDER — SODIUM CHLORIDE 0.9 % IV SOLN
INTRAVENOUS | Status: DC
  Administered 2018-09-06 (×3): via INTRAVENOUS

## 2018-09-06 MED ORDER — LIP MEDEX EX OINT
TOPICAL_OINTMENT | CUTANEOUS | Status: DC | PRN
  Administered 2018-09-06: 18:00:00 via TOPICAL
  Filled 2018-09-06: qty 7

## 2018-09-06 NOTE — Progress Notes (Signed)
PROGRESS NOTE  SULLIVAN BACKHUS ZLD:[id number] DOB: 26-Oct-1931 DOA: 09/04/2018 PCP: Sigmund Hazel, MD  HPI/Recap of past 24 hours:  Reports still feeling weak, but slightly improved with better appetite, less intermittent dry cough   Husband and daughter in law who is a home health RN  at bedside  Assessment/Plan: Active Problems:   Poor appetite   Hyponatremia   Dehydration   Hypokalemia   COPD (chronic obstructive pulmonary disease) (HCC)   Pyuria   Malnutrition of moderate degree   Hypomagnesemia  Hyponatremia -likely due to dehydration/poor oral intake, she is also on zoloft and HCTZ at home, she does drink wine daily,  -does not appear to have confusion, urine sodium is 77 but urine sample appear to be collected after IV hydration- -hold zoloft/hctz, tsh unremarkable, will check cortisol level continue hydration, slowly improving  Hypokalemia/hypomagnesemia Replaced, improved, repeat lab in am  Pyuria, urine culture + G-rod, continue Rocephin  COPD/recent treated for bronchitis with doxycycline and prednisone -Chest x-ray no acute findings -no wheezing, no hypoxia, monitor  HTN: home bp meds held on admission in the setting of dehydration, continue hold for today, likely able to resume tomorrow  Code Status: partial   Family Communication: patient , husband  And daughter in law at bedside  Disposition Plan: remain in stepdown, sodium remain critically low   Consultants:  none  Procedures:  none  Antibiotics:  rocephin   Objective: BP (!) 120/51   Pulse 80   Temp 98.1 F (36.7 C) (Oral)   Resp 17   Ht 5\' 3"  (1.6 m)   Wt 68.8 kg   SpO2 97%   BMI 26.87 kg/m   Intake/Output Summary (Last 24 hours) at 09/06/2018 0750 Last data filed at 09/05/2018 1600 Gross per 24 hour  Intake 1254.31 ml  Output -  Net 1254.31 ml   Filed Weights   09/04/18 1254 09/04/18 2240  Weight: 68 kg 68.8 kg    Exam: Patient is examined daily including today on  09/06/2018, exams remain the same as of yesterday except that has changed    General:  NAD  Cardiovascular: RRR, + murmur (reports chronic)  Respiratory: CTABL  Abdomen: Soft/ND/NT, positive BS  Musculoskeletal: No Edema  Neuro: alert, oriented   Data Reviewed: Basic Metabolic Panel: Recent Labs  Lab 09/04/18 1655 09/04/18 1849 09/04/18 2058 09/05/18 0240 09/06/18 0300  NA 116*  --   --  120* 122*  K 3.1*  --   --  3.3* 3.9  CL 80*  --   --  88* 93*  CO2 24  --   --  22 20*  GLUCOSE 106*  --   --  92 111*  BUN 35*  --   --  24* 19  CREATININE 0.80  --   --  0.63 0.65  CALCIUM 8.8*  --   --  7.8* 7.8*  MG  --  1.9  --  1.8 2.1  PHOS  --   --  3.3 3.0  --    Liver Function Tests: Recent Labs  Lab 09/04/18 1655 09/05/18 0240  AST 25 19  ALT 27 22  ALKPHOS 64 54  BILITOT 1.2 0.9  PROT 6.8 5.6*  ALBUMIN 3.7 3.1*   Recent Labs  Lab 09/04/18 1655  LIPASE 37   No results for input(s): AMMONIA in the last 168 hours. CBC: Recent Labs  Lab 09/04/18 1655 09/05/18 0240 09/06/18 0300  WBC 15.0* 11.7* 11.3*  NEUTROABS 12.6*  --  8.6*  HGB 12.6 11.4* 11.6*  HCT 35.6* 32.7* 34.2*  MCV 84.8 85.8 85.7  PLT 321 291 358   Cardiac Enzymes:   Recent Labs  Lab 09/05/18 0240 09/05/18 0900  TROPONINI <0.03 <0.03   BNP (last 3 results) No results for input(s): BNP in the last 8760 hours.  ProBNP (last 3 results) No results for input(s): PROBNP in the last 8760 hours.  CBG: No results for input(s): GLUCAP in the last 168 hours.  Recent Results (from the past 240 hour(s))  Urine culture     Status: Abnormal (Preliminary result)   Collection Time: 09/04/18  7:46 PM  Result Value Ref Range Status   Specimen Description   Final    URINE, CLEAN CATCH Performed at [facility], [address] [place], [place] [address]    Special Requests   Final    NONE Performed at [facility], [address], [place], [place]  [address]    Culture >=100,000 COLONIES/mL GRAM NEGATIVE RODS (A)  Final   Report Status PENDING  Incomplete  MRSA PCR Screening     Status: None   Collection Time: 09/04/18  8:57 PM  Result Value Ref Range Status   MRSA by PCR NEGATIVE NEGATIVE Final    Comment:        The GeneXpert MRSA Assay (FDA approved for NASAL specimens only), is one component of a comprehensive MRSA colonization surveillance program. It is not intended to diagnose MRSA infection nor to guide or monitor treatment for MRSA infections. Performed at [facility], [address] [place], [place] [address]      Studies: No results found.  Scheduled Meds: . aspirin EC  81 mg Oral Daily  . enoxaparin (LOVENOX) injection  40 mg Subcutaneous Q24H  . feeding supplement  1 Container Oral BID BM  . fluticasone furoate-vilanterol  1 puff Inhalation Daily  . ipratropium  0.5 mg Nebulization BID  . mouth rinse  15 mL Mouth Rinse BID  . multivitamin with minerals  1 tablet Oral Daily  . pravastatin  10 mg Oral q1800    Continuous Infusions: . sodium chloride 75 mL/hr at 09/05/18 1600  . cefTRIAXone (ROCEPHIN)  IV Stopped (09/05/18 2301)     Time spent: I have personally reviewed and interpreted on  09/06/2018 daily labs, tele strips, imagings as discussed above under date review session and assessment and plans.  I reviewed all nursing notes, pharmacy notes, vitals, pertinent old records  I have discussed plan of care as described above with RN , patient and family on 09/06/2018   Albertine Grates MD, PhD  [facility] Pager [phone]. If 7PM-7AM, please contact night-coverage at [email or web address], password [facility] 09/06/2018, 7:50 AM  LOS: 2 days

## 2018-09-06 NOTE — Evaluation (Signed)
Occupational Therapy Evaluation Patient Details Name: Robin [name] MRN: [id number] DOB: 1931-10-01 Today's Date: 09/06/2018    History of Present Illness 85 y.o. female with medical history significant of COPD, hyperlipidemia, hypertension, heart murmur GERD, osteoporosis and presented to ED with generalized fatigue; found to have hyponatremia   Clinical Impression   Pt was admitted for the above. At baseline, she is mod I with adls and she cooks.  Pt needs mostly min to mod A for adls but has decreased endurance and safety at this time. Will follow in acute setting with min guard level goals     Follow Up Recommendations  SNF;Supervision/Assistance - 24 hour(if home, HHOT)    Equipment Recommendations  None recommended by OT    Recommendations for Other Services       Precautions / Restrictions Precautions Precautions: Fall Restrictions Weight Bearing Restrictions: No      Mobility Bed Mobility         Supine to sit: Min assist;Mod assist     General bed mobility comments: increased assistance to scoot to EOB  Transfers   Equipment used: Rolling walker (2 wheeled)   Sit to Stand: Min assist         General transfer comment: assist to rise and steady. Cues for safety:  tends to sit before turning completely.  Cues for UE placement    Balance                                           ADL either performed or assessed with clinical judgement   ADL Overall ADL's : Needs assistance/impaired Eating/Feeding: Independent   Grooming: Set up   Upper Body Bathing: Set up   Lower Body Bathing: Minimal assistance   Upper Body Dressing : Set up   Lower Body Dressing: Minimal assistance   Toilet Transfer: Minimal assistance;Stand-pivot;BSC;RW   Toileting- Clothing Manipulation and Hygiene: Maximal assistance         General ADL Comments: performed SPT to [facility]. Pt walked to door of room; fatiques easily     Vision          Perception     Praxis      Pertinent Vitals/Pain Pain Assessment: No/denies pain     Hand Dominance     Extremity/Trunk Assessment Upper Extremity Assessment Upper Extremity Assessment: Generalized weakness           Communication Communication Communication: No difficulties   Cognition Arousal/Alertness: Awake/alert Behavior During Therapy: WFL for tasks assessed/performed Overall Cognitive Status: Within Functional Limits for tasks assessed                                     General Comments       Exercises     Shoulder Instructions      Home Living Family/patient expects to be discharged to:: Private residence Living Arrangements: Spouse/significant other;Children Available Help at Discharge: Family Type of Home: House Home Access: Stairs to enter Secretary/administrator of Steps: 1         Bathroom Shower/Tub: Producer, television/film/video: Handicapped height     Home Equipment: Shower seat;Grab bars - toilet;Bedside commode          Prior Functioning/Environment Level of Independence: Independent        Comments: just returned  from cruise        OT Problem List: Decreased strength;Decreased activity tolerance;Impaired balance (sitting and/or standing);Decreased knowledge of use of DME or AE;Decreased safety awareness      OT Treatment/Interventions: Self-care/ADL training;Energy conservation;DME and/or AE instruction;Patient/family education;Therapeutic activities;Balance training    OT Goals(Current goals can be found in the care plan section) Acute Rehab OT Goals Patient Stated Goal: home OT Goal Formulation: With patient Time For Goal Achievement: 09/20/18 Potential to Achieve Goals: Good ADL Goals Pt Will Transfer to Toilet: with min guard assist;bedside commode;ambulating Pt Will Perform Toileting - Clothing Manipulation and hygiene: with min guard assist;sit to/from stand Additional ADL Goal #1: pt will  complete adl at min guard level Additional ADL Goal #2: pt will initiate rest break without cues for energy conservation  OT Frequency: Min 2X/week   Barriers to D/C:            Co-evaluation PT/OT/SLP Co-Evaluation/Treatment: Yes Reason for Co-Treatment: To address functional/ADL transfers PT goals addressed during session: Mobility/safety with mobility OT goals addressed during session: ADL's and self-care      AM-PAC OT "6 Clicks" Daily Activity     Outcome Measure Help from another person eating meals?: None Help from another person taking care of personal grooming?: A Little Help from another person toileting, which includes using toliet, bedpan, or urinal?: A Little Help from another person bathing (including washing, rinsing, drying)?: A Little Help from another person to put on and taking off regular upper body clothing?: A Little Help from another person to put on and taking off regular lower body clothing?: A Little 6 Click Score: 19   End of Session    Activity Tolerance: Patient limited by fatigue Patient left: in chair;with call bell/phone within reach;with chair alarm set;with family/visitor present  OT Visit Diagnosis: Unsteadiness on feet (R26.81);Muscle weakness (generalized) (M62.81)                Time: 8675-4492 OT Time Calculation (min): 28 min Charges:  OT General Charges $OT Visit: 1 Visit OT Evaluation $OT Eval Low Complexity: 1 Low  Marica Otter, OTR/L Acute Rehabilitation Services [phone] [facility] pager [phone] office 09/06/2018  Kymoni Monday 09/06/2018, 12:49 PM

## 2018-09-06 NOTE — Progress Notes (Signed)
Physical Therapy Treatment Patient Details Name: Robin [name] MRN: [id number] DOB: 02/02/1932 Today's Date: 09/06/2018    History of Present Illness 85 y.o. female with medical history significant of COPD, hyperlipidemia, hypertension, heart murmur GERD, osteoporosis and presented to ED with generalized fatigue; found to have hyponatremia    PT Comments    The patient tolerated  A short distance ambulation. Patient moves very slowly. Decreased initiation. Noted that sodium 122 today. Patient may benefit from SNF unless improves considerably and/or has  Family to assist.  Follow Up Recommendations  SNF(unless has family other than spouse)     Equipment Recommendations  None recommended by PT    Recommendations for Other Services       Precautions / Restrictions Precautions Precautions: Fall Restrictions Weight Bearing Restrictions: No    Mobility  Bed Mobility Overal bed mobility: Needs Assistance Bed Mobility: Supine to Sit     Supine to sit: Min assist;Mod assist     General bed mobility comments: increased assistance to scoot to EOB  Transfers Overall transfer level: Needs assistance Equipment used: Rolling walker (2 wheeled) Transfers: Sit to/from [employer] Sit to Stand: Min assist Stand pivot transfers: Min assist       General transfer comment: assist to rise and steady. Cues for safety:  tends to sit before turning completely.  Cues for UE placement,. Assisted to [facility]  Ambulation/Gait Ambulation/Gait assistance: Min assist Gait Distance (Feet): 5 Feet(then 25') Assistive device: Rolling walker (2 wheeled) Gait Pattern/deviations: Step-to pattern;Step-through pattern;Decreased stride length     General Gait Details: patient taking shuffling steps, extra time to ambulate the  distance   Stairs             Wheelchair Mobility    Modified Rankin (Stroke Patients Only)       Balance                                             Cognition Arousal/Alertness: Awake/alert Behavior During Therapy: WFL for tasks assessed/performed Overall Cognitive Status: Within Functional Limits for tasks assessed                                        Exercises      General Comments        Pertinent Vitals/Pain Pain Assessment: No/denies pain    Home Living Family/patient expects to be discharged to:: Private residence Living Arrangements: Spouse/significant other;Children Available Help at Discharge: Family Type of Home: House Home Access: Stairs to enter     Home Equipment: Shower seat;Grab bars - toilet;Bedside commode      Prior Function Level of Independence: Independent      Comments: just returned from cruise   PT Goals (current goals can now be found in the care plan section) Acute Rehab PT Goals Patient Stated Goal: home Progress towards PT goals: Progressing toward goals    Frequency    Min 3X/week      PT Plan Discharge plan needs to be updated    Co-evaluation PT/OT/SLP Co-Evaluation/Treatment: Yes Reason for Co-Treatment: For patient/therapist safety PT goals addressed during session: Mobility/safety with mobility OT goals addressed during session: ADL's and self-care      AM-PAC PT "6 Clicks" Mobility   Outcome Measure  Help needed turning from  your back to your side while in a flat bed without using bedrails?: A Little Help needed moving from lying on your back to sitting on the side of a flat bed without using bedrails?: A Lot Help needed moving to and from a bed to a chair (including a wheelchair)?: A Lot Help needed standing up from a chair using your arms (e.g., wheelchair or bedside chair)?: A Lot Help needed to walk in hospital room?: A Lot Help needed climbing 3-5 steps with a railing? : A Lot 6 Click Score: 13    End of Session Equipment Utilized During Treatment: Gait belt Activity Tolerance: Patient tolerated treatment  well Patient left: in chair;with call bell/phone within reach;with chair alarm set;with family/visitor present Nurse Communication: Mobility status PT Visit Diagnosis: Muscle weakness (generalized) (M62.81);Difficulty in walking, not elsewhere classified (R26.2)     Time: [id number] PT Time Calculation (min) (ACUTE ONLY): 27 min  Charges:  $Gait Training: 8-22 mins                     Blanchard KelchKaren Dameian Crisman PT Acute Rehabilitation Services Pager [phone] Office [phone]    Rada HayHill, Montoya Watkin Elizabeth 09/06/2018, 1:08 PM

## 2018-09-07 DIAGNOSIS — R627 Adult failure to thrive: Secondary | ICD-10-CM

## 2018-09-07 LAB — CBC WITH DIFFERENTIAL/PLATELET
Abs Immature Granulocytes: 0.06 10*3/uL (ref 0.00–0.07)
Basophils Absolute: 0 10*3/uL (ref 0.0–0.1)
Basophils Relative: 1 %
Eosinophils Absolute: 0.1 10*3/uL (ref 0.0–0.5)
Eosinophils Relative: 2 %
HCT: 30.1 % — ABNORMAL LOW (ref 36.0–46.0)
Hemoglobin: 10.2 g/dL — ABNORMAL LOW (ref 12.0–15.0)
Immature Granulocytes: 1 %
Lymphocytes Relative: 20 %
Lymphs Abs: 1.4 10*3/uL (ref 0.7–4.0)
MCH: 29.8 pg (ref 26.0–34.0)
MCHC: 33.9 g/dL (ref 30.0–36.0)
MCV: 88 fL (ref 80.0–100.0)
MONO ABS: 0.7 10*3/uL (ref 0.1–1.0)
Monocytes Relative: 10 %
NEUTROS ABS: 4.5 10*3/uL (ref 1.7–7.7)
Neutrophils Relative %: 66 %
Platelets: 260 10*3/uL (ref 150–400)
RBC: 3.42 MIL/uL — ABNORMAL LOW (ref 3.87–5.11)
RDW: 13.2 % (ref 11.5–15.5)
WBC: 6.7 10*3/uL (ref 4.0–10.5)
nRBC: 0 % (ref 0.0–0.2)

## 2018-09-07 LAB — BASIC METABOLIC PANEL
Anion gap: 6 (ref 5–15)
BUN: 14 mg/dL (ref 8–23)
CO2: 22 mmol/L (ref 22–32)
Calcium: 7.3 mg/dL — ABNORMAL LOW (ref 8.9–10.3)
Chloride: 99 mmol/L (ref 98–111)
Creatinine, Ser: 0.56 mg/dL (ref 0.44–1.00)
GFR calc Af Amer: >60 mL/min (ref >60)
GFR calc non Af Amer: >60 mL/min (ref >60)
Glucose, Bld: 98 mg/dL (ref 70–99)
POTASSIUM: 3.6 mmol/L (ref 3.5–5.1)
Sodium: 127 mmol/L — ABNORMAL LOW (ref 135–145)

## 2018-09-07 LAB — CORTISOL: Cortisol, Plasma: 2.4 ug/dL

## 2018-09-07 LAB — URINE CULTURE: Culture: >=100000 — AB

## 2018-09-07 MED ORDER — COSYNTROPIN 0.25 MG IJ SOLR
0.2500 mg | Freq: Once | INTRAMUSCULAR | Status: AC
  Administered 2018-09-08: 0.25 mg via INTRAVENOUS
  Filled 2018-09-07: qty 0.25

## 2018-09-07 MED ORDER — CEPHALEXIN 500 MG PO CAPS
500.0000 mg | ORAL_CAPSULE | Freq: Three times a day (TID) | ORAL | Status: DC
  Administered 2018-09-07 – 2018-09-09 (×5): 500 mg via ORAL
  Filled 2018-09-07 (×5): qty 1

## 2018-09-07 MED ORDER — TIOTROPIUM BROMIDE MONOHYDRATE 18 MCG IN CAPS
18.0000 ug | ORAL_CAPSULE | Freq: Every day | RESPIRATORY_TRACT | Status: DC
  Administered 2018-09-08 – 2018-09-09 (×2): 18 ug via RESPIRATORY_TRACT
  Filled 2018-09-07: qty 5

## 2018-09-07 MED ORDER — AMLODIPINE BESYLATE 5 MG PO TABS
2.5000 mg | ORAL_TABLET | Freq: Every day | ORAL | Status: DC
  Administered 2018-09-07 – 2018-09-09 (×3): 2.5 mg via ORAL
  Filled 2018-09-07 (×3): qty 1

## 2018-09-07 MED ORDER — POLYETHYLENE GLYCOL 3350 17 G PO PACK
17.0000 g | PACK | Freq: Every day | ORAL | Status: DC
  Administered 2018-09-09: 17 g via ORAL
  Filled 2018-09-07 (×2): qty 1

## 2018-09-07 MED ORDER — SENNOSIDES-DOCUSATE SODIUM 8.6-50 MG PO TABS
1.0000 | ORAL_TABLET | Freq: Two times a day (BID) | ORAL | Status: DC
  Administered 2018-09-07 – 2018-09-09 (×4): 1 via ORAL
  Filled 2018-09-07 (×4): qty 1

## 2018-09-07 NOTE — Progress Notes (Signed)
Patient to transfer to 5E room 1501.   Report given to Toniann FailWendy the receiving nurse, all questions answered at this time.  Pt. VSS with no s/s of distress noted.  Patient stable for transfer.

## 2018-09-07 NOTE — Progress Notes (Signed)
Pt has arrived to Room 1501. A&O and no distress noted.

## 2018-09-07 NOTE — Progress Notes (Signed)
PROGRESS NOTE  Robin [name] UXN:[id number] DOB: 07-26-1932 DOA: 09/04/2018 PCP: Sigmund Hazel, MD  HPI/Recap of past 24 hours:   Reports feeling stronger ,c/o being constipated Husband at bedside  Assessment/Plan: Active Problems:   Poor appetite   Hyponatremia   Dehydration   Hypokalemia   COPD (chronic obstructive pulmonary disease) (HCC)   Pyuria   Malnutrition of moderate degree   Hypomagnesemia   Acute lower UTI  Hyponatremia -likely due to dehydration/poor oral intake, she is also on zoloft and HCTZ at home, she does drink wine daily,  -does not appear to have confusion, urine sodium is 77 but urine sample appear to be collected after IV hydration- -hold zoloft/hctz, tsh unremarkable -am cortisol level 2.4, will get orthostatic vital sign and cosyntropin stim test -Sodium on presentation 116, improved today at 127 , improved oral intake , DC hydration   Hypokalemia/hypomagnesemia Replaced, improved, repeat lab in am  UTI, urine culture + proteus resistant to nitrofurantoin sensitive to rest of antibiotic tested -she received Rocephin since admission, change to oral keflex, last dose on 1/12 to finish total of 5 days abx treatment.  COPD/recent treated for bronchitis with doxycycline and prednisone -Chest x-ray no acute findings -no wheezing, no hypoxia, monitor  HTN: home bp meds held on admission , bp improving,  Resume home meds norvasc 2.5mg  today, continue hold Diovan/HCT.  FTT: Physical therapy  recommended SNF placement, and prefers home with home health  Code Status: partial   Family Communication: patient , husband  at bedside  Disposition Plan: Improving, transfer out of stepdown to med surge    Consultants:  none  Procedures:  none  Antibiotics:  Rocephin then keflex   Objective: BP 125/64   Pulse 83   Temp 98.3 F (36.8 C) (Oral)   Resp 13   Ht 5\' 3"  (1.6 m)   Wt 68.8 kg   SpO2 100%   BMI 26.87 kg/m   Intake/Output  Summary (Last 24 hours) at 09/07/2018 0750 Last data filed at 09/06/2018 1452 Gross per 24 hour  Intake 2212.44 ml  Output 350 ml  Net 1862.44 ml   Filed Weights   09/04/18 1254 09/04/18 2240  Weight: 68 kg 68.8 kg    Exam: Patient is examined daily including today on 09/07/2018, exams remain the same as of yesterday except that has changed    General:  NAD  Cardiovascular: RRR, + murmur (reports chronic)  Respiratory: CTABL  Abdomen: Soft/ND/NT, positive BS  Musculoskeletal: No Edema  Neuro: alert, oriented   Data Reviewed: Basic Metabolic Panel: Recent Labs  Lab 09/04/18 1655 09/04/18 1849 09/04/18 2058 09/05/18 0240 09/06/18 0300 09/07/18 0323  NA 116*  --   --  120* 122* 127*  K 3.1*  --   --  3.3* 3.9 3.6  CL 80*  --   --  88* 93* 99  CO2 24  --   --  22 20* 22  GLUCOSE 106*  --   --  92 111* 98  BUN 35*  --   --  24* 19 14  CREATININE 0.80  --   --  0.63 0.65 0.56  CALCIUM 8.8*  --   --  7.8* 7.8* 7.3*  MG  --  1.9  --  1.8 2.1  --   PHOS  --   --  3.3 3.0  --   --    Liver Function Tests: Recent Labs  Lab 09/04/18 1655 09/05/18 0240  AST 25 19  ALT 27 22  ALKPHOS 64 54  BILITOT 1.2 0.9  PROT 6.8 5.6*  ALBUMIN 3.7 3.1*   Recent Labs  Lab 09/04/18 1655  LIPASE 37   No results for input(s): AMMONIA in the last 168 hours. CBC: Recent Labs  Lab 09/04/18 1655 09/05/18 0240 09/06/18 0300 09/07/18 0323  WBC 15.0* 11.7* 11.3* 6.7  NEUTROABS 12.6*  --  8.6* 4.5  HGB 12.6 11.4* 11.6* 10.2*  HCT 35.6* 32.7* 34.2* 30.1*  MCV 84.8 85.8 85.7 88.0  PLT 321 291 358 260   Cardiac Enzymes:   Recent Labs  Lab 09/05/18 0240 09/05/18 0900  TROPONINI <0.03 <0.03   BNP (last 3 results) No results for input(s): BNP in the last 8760 hours.  ProBNP (last 3 results) No results for input(s): PROBNP in the last 8760 hours.  CBG: No results for input(s): GLUCAP in the last 168 hours.  Recent Results (from the past 240 hour(s))  Urine culture      Status: Abnormal (Preliminary result)   Collection Time: 09/04/18  7:46 PM  Result Value Ref Range Status   Specimen Description   Final    URINE, CLEAN CATCH Performed at [facility], [address] [place], [place] [address]    Special Requests   Final    NONE Performed at [facility], [address] [place], [place] [address]    Culture >=100,000 COLONIES/mL PROTEUS MIRABILIS (A)  Final   Report Status PENDING  Incomplete  MRSA PCR Screening     Status: None   Collection Time: 09/04/18  8:57 PM  Result Value Ref Range Status   MRSA by PCR NEGATIVE NEGATIVE Final    Comment:        The GeneXpert MRSA Assay (FDA approved for NASAL specimens only), is one component of a comprehensive MRSA colonization surveillance program. It is not intended to diagnose MRSA infection nor to guide or monitor treatment for MRSA infections. Performed at [facility], [address] [place], [place] [address]      Studies: No results found.  Scheduled Meds: . aspirin EC  81 mg Oral Daily  . enoxaparin (LOVENOX) injection  40 mg Subcutaneous Q24H  . feeding supplement  1 Container Oral BID BM  . fluticasone  2 spray Each Nare Daily  . fluticasone furoate-vilanterol  1 puff Inhalation Daily  . mouth rinse  15 mL Mouth Rinse BID  . multivitamin with minerals  1 tablet Oral Daily  . pravastatin  10 mg Oral q1800  . tiotropium  18 mcg Inhalation Daily    Continuous Infusions: . sodium chloride 100 mL/hr at 09/06/18 2221  . cefTRIAXone (ROCEPHIN)  IV Stopped (09/06/18 2253)     Time spent: I have personally reviewed and interpreted on  09/07/2018 daily labs, imagings as discussed above under date review session and assessment and plans.  I reviewed all nursing notes, pharmacy notes, vitals, pertinent old records  I have discussed plan of care as described above with RN , patient and family on 09/07/2018   Albertine Grates  MD, PhD  [facility] Pager [phone]. If 7PM-7AM, please contact night-coverage at [email or web address], password [facility] 09/07/2018, 7:50 AM  LOS: 3 days

## 2018-09-08 LAB — CBC
HCT: 31.8 % — ABNORMAL LOW (ref 36.0–46.0)
Hemoglobin: 10.6 g/dL — ABNORMAL LOW (ref 12.0–15.0)
MCH: 29.2 pg (ref 26.0–34.0)
MCHC: 33.3 g/dL (ref 30.0–36.0)
MCV: 87.6 fL (ref 80.0–100.0)
Platelets: 299 10*3/uL (ref 150–400)
RBC: 3.63 MIL/uL — ABNORMAL LOW (ref 3.87–5.11)
RDW: 13.1 % (ref 11.5–15.5)
WBC: 6.5 10*3/uL (ref 4.0–10.5)
nRBC: 0 % (ref 0.0–0.2)

## 2018-09-08 LAB — BASIC METABOLIC PANEL
Anion gap: 9 (ref 5–15)
BUN: 8 mg/dL (ref 8–23)
CO2: 24 mmol/L (ref 22–32)
CREATININE: 0.5 mg/dL (ref 0.44–1.00)
Calcium: 8 mg/dL — ABNORMAL LOW (ref 8.9–10.3)
Chloride: 96 mmol/L — ABNORMAL LOW (ref 98–111)
GFR calc Af Amer: >60 mL/min (ref >60)
GFR calc non Af Amer: >60 mL/min (ref >60)
Glucose, Bld: 104 mg/dL — ABNORMAL HIGH (ref 70–99)
Potassium: 3.4 mmol/L — ABNORMAL LOW (ref 3.5–5.1)
Sodium: 129 mmol/L — ABNORMAL LOW (ref 135–145)

## 2018-09-08 LAB — ACTH STIMULATION, 3 TIME POINTS
Cortisol, 30 Min: 10.6 ug/dL
Cortisol, 60 Min: 17.4 ug/dL
Cortisol, Base: 4.8 ug/dL

## 2018-09-08 MED ORDER — POTASSIUM CHLORIDE CRYS ER 20 MEQ PO TBCR
40.0000 meq | EXTENDED_RELEASE_TABLET | Freq: Once | ORAL | Status: AC
  Administered 2018-09-08: 40 meq via ORAL
  Filled 2018-09-08: qty 2

## 2018-09-08 MED ORDER — IRBESARTAN 75 MG PO TABS
75.0000 mg | ORAL_TABLET | Freq: Every day | ORAL | Status: DC
  Administered 2018-09-09: 75 mg via ORAL
  Filled 2018-09-08: qty 1

## 2018-09-08 NOTE — Progress Notes (Signed)
PROGRESS NOTE  Robin SorBarbara M [name] ZOX:[id number] DOB: Sep 27, 1931 DOA: 09/04/2018 PCP: Sigmund HazelMiller, Lisa, MD  HPI/Recap of past 24 hours:   Reports feeling stronger ,appetite is improving +bm Husband at bedside  Assessment/Plan: Active Problems:   Poor appetite   Hyponatremia   Dehydration   Hypokalemia   COPD (chronic obstructive pulmonary disease) (HCC)   Pyuria   Malnutrition of moderate degree   Hypomagnesemia   Acute lower UTI   FTT (failure to thrive) in adult  Hyponatremia -likely due to dehydration/poor oral intake, she is also on zoloft and HCTZ at home, she does drink wine daily,  -does not appear to have confusion, urine sodium is 77 but urine sample appear to be collected after IV hydration- -hold zoloft/hctz, tsh unremarkable -am cortisol level 2.4, cosyntropin stim test unremarkalbe -heart rate increased but bp did not drop on  orthostatic vital sign  -Sodium on presentation 116, improved today at 129 , improved oral intake  Hypokalemia/hypomagnesemia Replaced, improved, repeat lab in am  UTI, urine culture + proteus resistant to nitrofurantoin sensitive to rest of antibiotic tested -she received Rocephin since admission, change to oral keflex, last dose on 1/12 to finish total of 5 days abx treatment.  COPD/recent treated for bronchitis with doxycycline and prednisone -Chest x-ray no acute findings -no wheezing, no hypoxia, monitor  HTN: home bp meds held on admission , bp improving,  Resume home meds norvasc 2.5mg  today, continue hold Diovan/HCT.  FTT: Physical therapy  recommended SNF placement, patient prefers home with home health  Code Status: partial   Family Communication: patient , husband  at bedside  Disposition Plan: Improving, likely home tomorrow   Consultants:  none  Procedures:  none  Antibiotics:  Rocephin then keflex   Objective: BP (!) 109/56   Pulse (!) 111   Temp (!) 97.4 F (36.3 C) (Oral)   Resp 20   Ht 5\' 3"  (1.6  m)   Wt 68.8 kg   SpO2 97%   BMI 26.87 kg/m   Intake/Output Summary (Last 24 hours) at 09/08/2018 1243 Last data filed at 09/08/2018 0800 Gross per 24 hour  Intake 240 ml  Output 100 ml  Net 140 ml   Filed Weights   09/04/18 1254 09/04/18 2240  Weight: 68 kg 68.8 kg    Exam: Patient is examined daily including today on 09/08/2018, exams remain the same as of yesterday except that has changed    General:  NAD  Cardiovascular: RRR, + murmur (reports chronic)  Respiratory: CTABL  Abdomen: Soft/ND/NT, positive BS  Musculoskeletal: No Edema  Neuro: alert, oriented   Data Reviewed: Basic Metabolic Panel: Recent Labs  Lab 09/04/18 1655 09/04/18 1849 09/04/18 2058 09/05/18 0240 09/06/18 0300 09/07/18 0323 09/08/18 0532  NA 116*  --   --  120* 122* 127* 129*  K 3.1*  --   --  3.3* 3.9 3.6 3.4*  CL 80*  --   --  88* 93* 99 96*  CO2 24  --   --  22 20* 22 24  GLUCOSE 106*  --   --  92 111* 98 104*  BUN 35*  --   --  24* 19 14 8   CREATININE 0.80  --   --  0.63 0.65 0.56 0.50  CALCIUM 8.8*  --   --  7.8* 7.8* 7.3* 8.0*  MG  --  1.9  --  1.8 2.1  --   --   PHOS  --   --  3.3  3.0  --   --   --    Liver Function Tests: Recent Labs  Lab 09/04/18 1655 09/05/18 0240  AST 25 19  ALT 27 22  ALKPHOS 64 54  BILITOT 1.2 0.9  PROT 6.8 5.6*  ALBUMIN 3.7 3.1*   Recent Labs  Lab 09/04/18 1655  LIPASE 37   No results for input(s): AMMONIA in the last 168 hours. CBC: Recent Labs  Lab 09/04/18 1655 09/05/18 0240 09/06/18 0300 09/07/18 0323 09/08/18 0532  WBC 15.0* 11.7* 11.3* 6.7 6.5  NEUTROABS 12.6*  --  8.6* 4.5  --   HGB 12.6 11.4* 11.6* 10.2* 10.6*  HCT 35.6* 32.7* 34.2* 30.1* 31.8*  MCV 84.8 85.8 85.7 88.0 87.6  PLT 321 291 358 260 299   Cardiac Enzymes:   Recent Labs  Lab 09/05/18 0240 09/05/18 0900  TROPONINI <0.03 <0.03   BNP (last 3 results) No results for input(s): BNP in the last 8760 hours.  ProBNP (last 3 results) No results for input(s):  PROBNP in the last 8760 hours.  CBG: No results for input(s): GLUCAP in the last 168 hours.  Recent Results (from the past 240 hour(s))  Urine culture     Status: Abnormal   Collection Time: 09/04/18  7:46 PM  Result Value Ref Range Status   Specimen Description   Final    URINE, CLEAN CATCH Performed at [facility], [address] [place], [place] [address]    Special Requests   Final    NONE Performed at [facility], [address] [place], [place] [address]    Culture >=100,000 COLONIES/mL PROTEUS MIRABILIS (A)  Final   Report Status 09/07/2018 FINAL  Final   Organism ID, Bacteria PROTEUS MIRABILIS (A)  Final      Susceptibility   Proteus mirabilis - MIC*    AMPICILLIN <=2 SENSITIVE Sensitive     CEFAZOLIN <=4 SENSITIVE Sensitive     CEFTRIAXONE <=1 SENSITIVE Sensitive     CIPROFLOXACIN <=0.25 SENSITIVE Sensitive     GENTAMICIN <=1 SENSITIVE Sensitive     IMIPENEM 0.5 SENSITIVE Sensitive     NITROFURANTOIN RESISTANT Resistant     TRIMETH/SULFA <=20 SENSITIVE Sensitive     AMPICILLIN/SULBACTAM <=2 SENSITIVE Sensitive     PIP/TAZO <=4 SENSITIVE Sensitive     * >=100,000 COLONIES/mL PROTEUS MIRABILIS  MRSA PCR Screening     Status: None   Collection Time: 09/04/18  8:57 PM  Result Value Ref Range Status   MRSA by PCR NEGATIVE NEGATIVE Final    Comment:        The GeneXpert MRSA Assay (FDA approved for NASAL specimens only), is one component of a comprehensive MRSA colonization surveillance program. It is not intended to diagnose MRSA infection nor to guide or monitor treatment for MRSA infections. Performed at [facility], [address] [place], [place] [address]      Studies: No results found.  Scheduled Meds: . amLODipine  2.5 mg Oral Daily  . aspirin EC  81 mg Oral Daily  . cephALEXin  500 mg Oral Q8H  . enoxaparin (LOVENOX) injection  40 mg Subcutaneous Q24H  . feeding supplement  1  Container Oral BID BM  . fluticasone  2 spray Each Nare Daily  . fluticasone furoate-vilanterol  1 puff Inhalation Daily  . mouth rinse  15 mL Mouth Rinse BID  . multivitamin with minerals  1 tablet Oral Daily  . polyethylene glycol  17 g Oral Daily  .  pravastatin  10 mg Oral q1800  . senna-docusate  1 tablet Oral BID  . tiotropium  18 mcg Inhalation Daily    Continuous Infusions:    Time spent: I have personally reviewed and interpreted on  09/08/2018 daily labs, imagings as discussed above under date review session and assessment and plans.  I reviewed all nursing notes, pharmacy notes, vitals, pertinent old records  I have discussed plan of care as described above with RN , patient and family on 09/08/2018   Albertine Grates MD, PhD  [facility] Pager [phone]. If 7PM-7AM, please contact night-coverage at [email or web address], password [facility] 09/08/2018, 12:43 PM  LOS: 4 days

## 2018-09-08 NOTE — Progress Notes (Signed)
Increase pulse with patients ortho vital signs. (standing) Same as yesterday evening ortho vital signs. (standing) MD aware.

## 2018-09-08 NOTE — Progress Notes (Signed)
MD please be aware that family has requested HOME HEALTH PT, OT, AIDE on discharge.

## 2018-09-09 LAB — BASIC METABOLIC PANEL
ANION GAP: 8 (ref 5–15)
BUN: 8 mg/dL (ref 8–23)
CALCIUM: 8.2 mg/dL — AB (ref 8.9–10.3)
CO2: 24 mmol/L (ref 22–32)
Chloride: 100 mmol/L (ref 98–111)
Creatinine, Ser: 0.47 mg/dL (ref 0.44–1.00)
GFR calc Af Amer: >60 mL/min (ref >60)
GFR calc non Af Amer: >60 mL/min (ref >60)
Glucose, Bld: 96 mg/dL (ref 70–99)
Potassium: 4 mmol/L (ref 3.5–5.1)
Sodium: 132 mmol/L — ABNORMAL LOW (ref 135–145)

## 2018-09-09 LAB — MAGNESIUM: Magnesium: 2.1 mg/dL (ref 1.7–2.4)

## 2018-09-09 NOTE — Care Management Important Message (Signed)
Important Message  Patient Details  Name: Robin [name] MRN: [id number] Date of Birth: [birth year]   Medicare Important Message Given:  Yes    Caren Macadam 09/09/2018, 11:51 AMImportant Message  Patient Details  Name: Robin [name] MRN: [id number] Date of Birth: Jan 20, 1932   Medicare Important Message Given:  Yes    Caren Macadam 09/09/2018, 11:51 AM

## 2018-09-09 NOTE — Progress Notes (Signed)
BP= 150/61. Patient has history of HTN and on medication at home. Will monitor patient's BP closely.

## 2018-09-09 NOTE — Progress Notes (Signed)
Occupational Therapy Treatment Patient Details Name: Robin [name] MRN: [id number] DOB: June 13, 1932 Today's Date: 09/09/2018    History of present illness 85 y.o. female with medical history significant of COPD, hyperlipidemia, hypertension, heart murmur GERD, osteoporosis and presented to ED with generalized fatigue; found to have hyponatremia   OT comments  Husband will A as needed  Follow Up Recommendations   24 hour A and  HHOT   Equipment Recommendations  None recommended by OT    Recommendations for Other Services      Precautions / Restrictions Precautions Precautions: Fall       Mobility Bed Mobility               General bed mobility comments: Pt OOB  Transfers Overall transfer level: Needs assistance Equipment used: Rolling walker (2 wheeled) Transfers: Sit to/from [employer] Sit to Stand: Min assist Stand pivot transfers: Min assist            Balance Overall balance assessment: Needs assistance         Standing balance support: Bilateral upper extremity supported Standing balance-Leahy Scale: Poor Standing balance comment: requires UE support                           ADL either performed or assessed with clinical judgement   ADL Overall ADL's : Needs assistance/impaired     Grooming: Standing;Minimal assistance           Upper Body Dressing : Minimal assistance;Sitting   Lower Body Dressing: Moderate assistance;Sit to/from stand;Cueing for sequencing;Cueing for safety   Toilet Transfer: Minimal assistance;Stand-pivot;RW;Comfort height toilet   Toileting- Clothing Manipulation and Hygiene: Minimal assistance;Sit to/from stand               Vision Patient Visual Report: No change from baseline     Perception     Praxis      Cognition Arousal/Alertness: Awake/alert Behavior During Therapy: WFL for tasks assessed/performed Overall Cognitive Status: Within Functional Limits for tasks  assessed                                                     Pertinent Vitals/ Pain       Pain Assessment: No/denies pain     Prior Functioning/Environment              Frequency  Min 2X/week        Progress Toward Goals  OT Goals(current goals can now be found in the care plan section)  Progress towards OT goals: Progressing toward goals     Plan Discharge plan remains appropriate       AM-PAC OT "6 Clicks" Daily Activity     Outcome Measure   Help from another person eating meals?: None Help from another person taking care of personal grooming?: A Little Help from another person toileting, which includes using toliet, bedpan, or urinal?: A Little Help from another person bathing (including washing, rinsing, drying)?: A Little Help from another person to put on and taking off regular upper body clothing?: A Little Help from another person to put on and taking off regular lower body clothing?: A Little 6 Click Score: 19    End of Session Equipment Utilized During Treatment: Rolling walker  OT Visit Diagnosis: Unsteadiness on feet (R26.81);Muscle weakness (  generalized) (M62.81)   Activity Tolerance Patient tolerated treatment well   Patient Left in chair;with call bell/phone within reach;with chair alarm set;with family/visitor present   Nurse Communication          Time: [id number] OT Time Calculation (min): 18 min  Charges: OT General Charges $OT Visit: 1 Visit OT Treatments $Self Care/Home Management : 8-22 mins  Robin [name], OT Acute Rehabilitation Services [phone] Office- [phone]      Robin [name], Robin [name] 09/09/2018, 2:12 PM

## 2018-09-09 NOTE — Discharge Summary (Signed)
Discharge Summary  Robin SorBarbara M [name] ZOX:[id number] DOB: 06-Sep-1931  PCP: Sigmund HazelMiller, Lisa, MD  Admit date: 09/04/2018 Discharge date: 09/09/2018  Time spent: 30mins  Recommendations for Outpatient Follow-up:  1. F/u with PCP within a week  for hospital discharge follow up, repeat cbc/bmp at follow up. 2. Home health   Discharge Diagnoses:  Active Hospital Problems   Diagnosis Date Noted  . FTT (failure to thrive) in adult   . Acute lower UTI   . Malnutrition of moderate degree 09/05/2018  . Hypomagnesemia   . Hyponatremia 09/04/2018  . Dehydration 09/04/2018  . Hypokalemia 09/04/2018  . COPD (chronic obstructive pulmonary disease) (HCC) 09/04/2018  . Pyuria 09/04/2018  . Poor appetite 08/03/2015    Resolved Hospital Problems  No resolved problems to display.    Discharge Condition: stable  Diet recommendation: regular diet  Filed Weights   09/04/18 1254 09/04/18 2240  Weight: 68 kg 68.8 kg    History of present illness: (per admitting MD Dr Adela Glimpseoutova) HPI: Robin SorBarbara M Buzan is a 85 y.o. female with medical history significant of COPD, hyperlipidemia, hypertension, heart murmur GERD, osteoporosis    Presented with decreased appetite and generalized fatigue.  She was seen last week in emergency department for breath chest congestion and wheezing is history of COPD this is similar to COPD exacerbation.  [facility] and referred to ER. Reports [place] Reports just decreased appetite but no nausea no vomiting.    At that time she was diagnosed with bronchitis started on prednisone and doxycycline and discharged home She states that doxycycline has been irritating her stomach making it difficult to eat  Her husband has been sick as well with chest congestion.   Patient has inhalers at home that she uses for his COPD but did not seem to make it better.  Denies any fever no sore throat or runny nose no  headaches or body aches no chest pain.  She is no longer smoking but used to smoke in the past.  Smoked about a quarter pack a day for 6950 y.   Patient also mentioned that she has been having more dysuria with urination which is new.  After starting antibiotics she had couple episodes of diarrhea she took 2 pills of Imodium and that seemed to have helped now she feels more constipated.  That the use suppositories after which she did have a bowel movement.  Regarding pertinent Chronic problems:  History of hypertension for which she takes Diovan/HCTZ  While in ER: Was found to have sodium of 116  Hospital Course:  Active Problems:   Poor appetite   Hyponatremia   Dehydration   Hypokalemia   COPD (chronic obstructive pulmonary disease) (HCC)   Pyuria   Malnutrition of moderate degree   Hypomagnesemia   Acute lower UTI   FTT (failure to thrive) in adult   Hyponatremia -likely due to dehydration/poor oral intake, she is also on zoloft and HCTZ at home, she does drink wine daily,  -does not appear to have confusion, urine sodium is 77 but urine sample appear to be collected after IV hydration- -hold zoloft/hctz, tsh unremarkable -am cortisol level 2.4, cosyntropin stim test unremarkalbe  -she received hydration, HCTZ and zoloft held while in the hospital  --Sodium on presentation 116, improved today at 132 , improved oral intake. She wants to go home.  F/u with pcp to repeat basic labs in one  week.  Hypokalemia/hypomagnesemia Replaced, normalized   UTI, urine culture + proteus resistant to nitrofurantoin sensitive to rest of antibiotic tested -she received Rocephin since admission, change to oral keflex, last dose on 1/12 to finish total of 5 days abx treatment.  COPD/recent treated for bronchitis with doxycycline and prednisone -Chest x-ray no acute findings -no wheezing, no hypoxia, monitor  HTN: home bp meds held on admission , bp improving,  home meds norvasc 2.5mg  ,   diovan/ HCTZ resume at discharge  FTT: Physical therapy  recommended SNF placement, patient prefers home with home health  Code Status: partial , no intubation, no cpr, yes to NIPPV/bipap, yes to ACLS meds  Family Communication: patient , husband  at bedside  Disposition Plan:  home with home health   Consultants:  none  Procedures:  none  Antibiotics:  Rocephin then keflex   Discharge Exam: BP (!) 150/61 (BP Location: Left Arm)   Pulse 84   Temp 98.2 F (36.8 C)   Resp 18   Ht 5\' 3"  (1.6 m)   Wt 68.8 kg   SpO2 96%   BMI 26.87 kg/m    General:  NAD  Cardiovascular: RRR, + murmur (reports chronic)  Respiratory: CTABL  Abdomen: Soft/ND/NT, positive BS  Musculoskeletal: No Edema  Neuro: alert, oriented    Discharge Instructions You were cared for by a hospitalist during your hospital stay. If you have any questions about your discharge medications or the care you received while you were in the hospital after you are discharged, you can call the unit and asked to speak with the hospitalist on call if the hospitalist that took care of you is not available. Once you are discharged, your primary care physician will handle any further medical issues. Please note that NO REFILLS for any discharge medications will be authorized once you are discharged, as it is imperative that you return to your primary care physician (or establish a relationship with a primary care physician if you do not have one) for your aftercare needs so that they can reassess your need for medications and monitor your lab values.  Discharge Instructions    Diet general   Complete by:  As directed    Increase activity slowly   Complete by:  As directed    Increase activity slowly   Complete by:  As directed      Allergies as of 09/09/2018      Reactions   Fosamax [alendronate Sodium]    Poor healing to mouth wound   Augmentin [amoxicillin-pot Clavulanate] Nausea Only   DID THE  REACTION INVOLVE: Swelling of the face/tongue/throat, SOB, or low BP? No Sudden or severe rash/hives, skin peeling, or the inside of the mouth or nose? No Did it require medical treatment? No When did it last happen? If all above answers are "NO", may proceed with cephalosporin use.   Hydrocodone Nausea And Vomiting   Mobic [meloxicam] Rash   Sulfonamide Derivatives Rash      Medication List    STOP taking these medications   doxycycline 100 MG capsule Commonly known as:  VIBRAMYCIN   predniSONE 20 MG tablet Commonly known as:  DELTASONE     TAKE these medications   albuterol 108 (90 Base) MCG/ACT inhaler Commonly known as:  PROVENTIL HFA;VENTOLIN HFA Inhale 2 puffs into the lungs every 4 (four) hours as needed for wheezing or shortness of breath.   amLODipine 2.5 MG tablet Commonly known as:  NORVASC Take 2.5 mg by mouth  daily.   aspirin 81 MG tablet Take 81 mg by mouth daily.   BREO ELLIPTA 100-25 MCG/INH Aepb Generic drug:  fluticasone furoate-vilanterol Inhale 1 puff into the lungs daily.   fluticasone 50 MCG/ACT nasal spray Commonly known as:  FLONASE Place 1 spray into both nostrils daily.   IMODIUM A-D 2 MG capsule Generic drug:  loperamide Take 2 mg by mouth as needed for diarrhea or loose stools.   lovastatin 40 MG tablet Commonly known as:  MEVACOR Take 40 mg by mouth daily.   pantoprazole 40 MG tablet Commonly known as:  PROTONIX Take 1 tablet (40 mg total) by mouth daily.   sertraline 100 MG tablet Commonly known as:  ZOLOFT Take 100 mg by mouth daily.   tiotropium 18 MCG inhalation capsule Commonly known as:  SPIRIVA Place 18 mcg into inhaler and inhale daily.   traMADol 50 MG tablet Commonly known as:  ULTRAM Take 50 mg by mouth every 6 (six) hours as needed for moderate pain.   valsartan-hydrochlorothiazide 320-12.5 MG tablet Commonly known as:  DIOVAN-HCT Take 1 tablet by mouth daily.   Vitamin D (Ergocalciferol) 1.25 MG (50000  UT) Caps capsule Commonly known as:  DRISDOL Take 50,000 Units by mouth every 7 (seven) days.      Allergies  Allergen Reactions  . Fosamax [Alendronate Sodium]     Poor healing to mouth wound  . Augmentin [Amoxicillin-Pot Clavulanate] Nausea Only    DID THE REACTION INVOLVE: Swelling of the face/tongue/throat, SOB, or low BP? No Sudden or severe rash/hives, skin peeling, or the inside of the mouth or nose? No Did it require medical treatment? No When did it last happen? If all above answers are "NO", may proceed with cephalosporin use.   Marland Kitchen Hydrocodone Nausea And Vomiting  . Mobic [Meloxicam] Rash  . Sulfonamide Derivatives Rash   Follow-up Information    Sigmund Hazel, MD Follow up in 1 week(s).   Specialty:  Family Medicine Why:  hospital discharge follow up, repeat cbc/bmp at follow up. Contact information: [address] [place] [address] [phone]            The results of significant diagnostics from this hospitalization (including imaging, microbiology, ancillary and laboratory) are listed below for reference.    Significant Diagnostic Studies: Dg Chest 2 View  Result Date: 09/04/2018 CLINICAL DATA:  Increasing fatigue and loss of appetite, diagnosed with bronchitis last Thursday, history hypertension, former smoker EXAM: CHEST - 2 VIEW COMPARISON:  09/18/2018 FINDINGS: Upper normal heart size. Mediastinal contours and pulmonary vascularity normal. Atherosclerotic calcification aorta. Lungs appear mildly hyperinflated but clear. Costal cartilaginous calcifications project over the lungs bilaterally No infiltrate, pleural effusion or pneumothorax. Bones demineralized. IMPRESSION: Hyperinflated lungs without infiltrate. Electronically Signed   By: Ulyses Southward M.D.   On: 09/04/2018 17:32   Dg Chest 2 View  Result Date: 08/29/2018 CLINICAL DATA:  Shortness of breath for 1 week. EXAM: CHEST - 2 VIEW COMPARISON:  Radiographs 05/20/2018 FINDINGS: Chronic  hyperinflation, unchanged from prior exam. No focal airspace disease. Unchanged heart size and mediastinal contours with aortic atherosclerosis. No pulmonary edema, pleural effusion or pneumothorax. Multilevel degenerative change in the spine. Minimal loss of height of L1 vertebra is unchanged from 08/11/2018 spine radiographs. Superior subluxation of right humeral head suggesting chronic rotator cuff arthropathy. IMPRESSION: Chronic hyperinflation without acute abnormality. Electronically Signed   By: Narda Rutherford M.D.   On: 08/29/2018 21:00    Microbiology: Recent Results (from the past 240 hour(s))  Urine culture     Status: Abnormal   Collection Time: 09/04/18  7:46 PM  Result Value Ref Range Status   Specimen Description   Final    URINE, CLEAN CATCH Performed at [facility], [address], [place], [place] [address]    Special Requests   Final    NONE Performed at [facility], [address] [place], [place] [address]    Culture >=100,000 COLONIES/mL PROTEUS MIRABILIS (A)  Final   Report Status 09/07/2018 FINAL  Final   Organism ID, Bacteria PROTEUS MIRABILIS (A)  Final      Susceptibility   Proteus mirabilis - MIC*    AMPICILLIN <=2 SENSITIVE Sensitive     CEFAZOLIN <=4 SENSITIVE Sensitive     CEFTRIAXONE <=1 SENSITIVE Sensitive     CIPROFLOXACIN <=0.25 SENSITIVE Sensitive     GENTAMICIN <=1 SENSITIVE Sensitive     IMIPENEM 0.5 SENSITIVE Sensitive     NITROFURANTOIN RESISTANT Resistant     TRIMETH/SULFA <=20 SENSITIVE Sensitive     AMPICILLIN/SULBACTAM <=2 SENSITIVE Sensitive     PIP/TAZO <=4 SENSITIVE Sensitive     * >=100,000 COLONIES/mL PROTEUS MIRABILIS  MRSA PCR Screening     Status: None   Collection Time: 09/04/18  8:57 PM  Result Value Ref Range Status   MRSA by PCR NEGATIVE NEGATIVE Final    Comment:        The GeneXpert MRSA Assay (FDA approved for NASAL specimens only), is one component of a comprehensive MRSA  colonization surveillance program. It is not intended to diagnose MRSA infection nor to guide or monitor treatment for MRSA infections. Performed at [facility], [address], [place], [place] [address]      Labs: Basic Metabolic Panel: Recent Labs  Lab 09/04/18 1849 09/04/18 2058 09/05/18 0240 09/06/18 0300 09/07/18 0323 09/08/18 0532 09/09/18 0611  NA  --   --  120* 122* 127* 129* 132*  K  --   --  3.3* 3.9 3.6 3.4* 4.0  CL  --   --  88* 93* 99 96* 100  CO2  --   --  22 20* 22 24 24   GLUCOSE  --   --  92 111* 98 104* 96  BUN  --   --  24* 19 14 8 8   CREATININE  --   --  0.63 0.65 0.56 0.50 0.47  CALCIUM  --   --  7.8* 7.8* 7.3* 8.0* 8.2*  MG 1.9  --  1.8 2.1  --   --  2.1  PHOS  --  3.3 3.0  --   --   --   --    Liver Function Tests: Recent Labs  Lab 09/04/18 1655 09/05/18 0240  AST 25 19  ALT 27 22  ALKPHOS 64 54  BILITOT 1.2 0.9  PROT 6.8 5.6*  ALBUMIN 3.7 3.1*   Recent Labs  Lab 09/04/18 1655  LIPASE 37   No results for input(s): AMMONIA in the last 168 hours. CBC: Recent Labs  Lab 09/04/18 1655 09/05/18 0240 09/06/18 0300 09/07/18 0323 09/08/18 0532  WBC 15.0* 11.7* 11.3* 6.7 6.5  NEUTROABS 12.6*  --  8.6* 4.5  --   HGB 12.6 11.4* 11.6* 10.2* 10.6*  HCT 35.6* 32.7* 34.2* 30.1* 31.8*  MCV 84.8 85.8 85.7 88.0 87.6  PLT 321 291 358 260 299   Cardiac Enzymes: Recent Labs  Lab 09/05/18 0240 09/05/18 0900  TROPONINI <0.03 <0.03   BNP:  BNP (last 3 results) No results for input(s): BNP in the last 8760 hours.  ProBNP (last 3 results) No results for input(s): PROBNP in the last 8760 hours.  CBG: No results for input(s): GLUCAP in the last 168 hours.     Signed:  Albertine Grates MD, PhD  [facility] 09/09/2018, 11:16 AM

## 2018-09-09 NOTE — Progress Notes (Signed)
Patient has discharged to home with [facility] on 09/09/2018. Discharge instruction including medication and appointment was given to patient and family member at bedside. CM is awared of HH. No question at this time.

## 2018-09-09 NOTE — Progress Notes (Signed)
Acknowledging CSW consult for SNF placement, however CSW informed that pt has opted to return home at DC. Has HH arrangements.  Ilean Skill, MSW, LCSW Clinical Social Work 09/09/2018 [phone]

## 2018-10-17 ENCOUNTER — Other Ambulatory Visit: Payer: Self-pay | Admitting: Family Medicine

## 2018-10-17 DIAGNOSIS — R634 Abnormal weight loss: Secondary | ICD-10-CM

## 2018-10-17 DIAGNOSIS — R630 Anorexia: Secondary | ICD-10-CM

## 2018-10-17 DIAGNOSIS — M549 Dorsalgia, unspecified: Secondary | ICD-10-CM

## 2018-10-18 ENCOUNTER — Ambulatory Visit
Admission: RE | Admit: 2018-10-18 | Discharge: 2018-10-18 | Disposition: A | Discharge status: Home / Self Care | Payer: Medicare Other | Source: Ambulatory Visit | Attending: Family Medicine | Admitting: Family Medicine

## 2018-10-18 DIAGNOSIS — R634 Abnormal weight loss: Secondary | ICD-10-CM

## 2018-10-18 DIAGNOSIS — M549 Dorsalgia, unspecified: Secondary | ICD-10-CM

## 2018-10-18 DIAGNOSIS — R630 Anorexia: Secondary | ICD-10-CM

## 2018-10-18 MED ORDER — IOPAMIDOL (ISOVUE-300) INJECTION 61%
100.0000 mL | Freq: Once | INTRAVENOUS | Status: AC | PRN
  Administered 2018-10-18: 100 mL via INTRAVENOUS

## 2018-12-30 ENCOUNTER — Telehealth: Payer: Self-pay | Admitting: *Deleted

## 2018-12-30 NOTE — Telephone Encounter (Signed)
LEFT MESSAGE TO  CALL BACK IN REGARDS TO APPT 12/31/18 - NEED TO BE RESCHEDULE OR CHANGE

## 2018-12-31 ENCOUNTER — Ambulatory Visit: Payer: Medicare Other | Admitting: Cardiology

## 2018-12-31 NOTE — Telephone Encounter (Signed)
Patient spoke to scheduler - appointment changed to sept 2020

## 2019-03-13 ENCOUNTER — Other Ambulatory Visit: Payer: Self-pay

## 2019-03-13 ENCOUNTER — Encounter (INDEPENDENT_AMBULATORY_CARE_PROVIDER_SITE_OTHER): Payer: Self-pay

## 2019-03-13 ENCOUNTER — Encounter (HOSPITAL_COMMUNITY): Payer: Self-pay

## 2019-03-13 ENCOUNTER — Ambulatory Visit (HOSPITAL_COMMUNITY)
Admission: RE | Admit: 2019-03-13 | Discharge: 2019-03-13 | Disposition: A | Discharge status: Home / Self Care | Payer: Medicare Other | Source: Ambulatory Visit | Attending: Family Medicine | Admitting: Family Medicine

## 2019-03-13 DIAGNOSIS — M810 Age-related osteoporosis without current pathological fracture: Secondary | ICD-10-CM | POA: Diagnosis not present

## 2019-03-13 HISTORY — DX: Unspecified osteoarthritis, unspecified site: M19.90

## 2019-03-13 MED ORDER — SODIUM CHLORIDE 0.9 % IV SOLN
Freq: Once | INTRAVENOUS | Status: AC
  Administered 2019-03-13: 12:00:00 via INTRAVENOUS

## 2019-03-13 MED ORDER — ZOLEDRONIC ACID 5 MG/100ML IV SOLN
5.0000 mg | Freq: Once | INTRAVENOUS | Status: AC
  Administered 2019-03-13: 5 mg via INTRAVENOUS
  Filled 2019-03-13: qty 100

## 2019-03-13 NOTE — Discharge Instructions (Signed)

## 2019-04-23 ENCOUNTER — Other Ambulatory Visit: Payer: Self-pay

## 2019-04-23 ENCOUNTER — Encounter: Payer: Self-pay | Admitting: Internal Medicine

## 2019-04-23 ENCOUNTER — Non-Acute Institutional Stay: Payer: Medicare Other | Admitting: Internal Medicine

## 2019-04-23 VITALS — BP 130/68 | HR 107 | Temp 97.3°F | Resp 20 | Ht 61.0 in | Wt 134.0 lb

## 2019-04-23 DIAGNOSIS — I10 Essential (primary) hypertension: Secondary | ICD-10-CM

## 2019-04-23 DIAGNOSIS — G8929 Other chronic pain: Secondary | ICD-10-CM

## 2019-04-23 DIAGNOSIS — J438 Other emphysema: Secondary | ICD-10-CM | POA: Diagnosis not present

## 2019-04-23 DIAGNOSIS — R194 Change in bowel habit: Secondary | ICD-10-CM

## 2019-04-23 DIAGNOSIS — M545 Low back pain: Secondary | ICD-10-CM

## 2019-04-23 DIAGNOSIS — E871 Hypo-osmolality and hyponatremia: Secondary | ICD-10-CM

## 2019-04-23 DIAGNOSIS — F339 Major depressive disorder, recurrent, unspecified: Secondary | ICD-10-CM

## 2019-04-23 DIAGNOSIS — R011 Cardiac murmur, unspecified: Secondary | ICD-10-CM

## 2019-04-23 DIAGNOSIS — M810 Age-related osteoporosis without current pathological fracture: Secondary | ICD-10-CM

## 2019-04-23 NOTE — Progress Notes (Signed)
[facility]:  [facility] Service:  Clinic (12)  Provider:   Code Status:  Goals of Care:  Advanced Directives 09/04/2018  Does Patient Have a Medical Advance Directive? Yes  Type of Estate agent of [place];Living will  Does patient want to make changes to medical advance directive? No - Patient declined  Copy of Healthcare Power of Attorney in Chart? No - copy requested     Chief Complaint  Patient presents with  . New Patient (Initial Visit)    HPI: Patient is a 85 y.o. female seen today for medical management of chronic diseases.   Patient is here to establish Care with [place] Her active issues are Hyponatremia Was admitted in 1/20 with Sodium of 116. Responded to hydration was thought to be due to Dehydration and UTI Repeat Sodium has been stable Hypertension On valsartan/HCTZ and Norvasc LE edema Worsening recently COPD On Inhalers Chronic Back Pain  on Tramadol PRn Takes 1 a day Is going to follow with Neurosurgery for Epidural shots Unsteady gait Has not fallen recently. Walks very slowly with [employer]. Still drives Osteoporosis On Reclast since 2013 I do not have Recent Imaging for Bone scan Alternate Diarrhea and Constipation Is going to follow with GI Last Colonoscopy was in 2001  Recently has moved to friend's home.  Lives with her husband in [place].  Walks with cane.  Is independent in her ADLs and IADLs and husband has Parkinson disease   Past Medical History:  Diagnosis Date  . Arthritis   . Depression   . Diverticulosis   . GERD (gastroesophageal reflux disease)   . Heart murmur   . Hyperlipemia   . Hypertension   . Irregular heart beat 2014   patient states she has not heard back about echo.   . Osteoporosis     Past Surgical History:  Procedure Laterality Date  . ABDOMINAL HYSTERECTOMY     both appy and hyst done together  . APPENDECTOMY      Allergies  Allergen Reactions  . Fosamax [Alendronate Sodium]      Poor healing to mouth wound  . Augmentin [Amoxicillin-Pot Clavulanate] Nausea Only    DID THE REACTION INVOLVE: Swelling of the face/tongue/throat, SOB, or low BP? No Sudden or severe rash/hives, skin peeling, or the inside of the mouth or nose? No Did it require medical treatment? No When did it last happen? If all above answers are "NO", may proceed with cephalosporin use.   Marland Kitchen Hydrocodone Nausea And Vomiting  . Mobic [Meloxicam] Rash  . Sulfonamide Derivatives Rash    Outpatient Encounter Medications as of 04/23/2019  Medication Sig  . albuterol (PROVENTIL HFA;VENTOLIN HFA) 108 (90 Base) MCG/ACT inhaler Inhale 2 puffs into the lungs every 4 (four) hours as needed for wheezing or shortness of breath.  Marland Kitchen alum & mag hydroxide-simeth (MAALOX/MYLANTA) 200-200-20 MG/5ML suspension Take 30 mLs by mouth as needed for indigestion or heartburn.  Marland Kitchen amLODipine (NORVASC) 2.5 MG tablet Take 2.5 mg by mouth daily.  Marland Kitchen aspirin 81 MG tablet Take 81 mg by mouth daily.  . fluticasone (FLONASE) 50 MCG/ACT nasal spray Place 1 spray into both nostrils as needed.   . fluticasone furoate-vilanterol (BREO ELLIPTA) 100-25 MCG/INH AEPB Inhale 1 puff into the lungs daily.  Marland Kitchen lovastatin (MEVACOR) 40 MG tablet Take 40 mg by mouth daily.   . pantoprazole (PROTONIX) 40 MG tablet Take 1 tablet (40 mg total) by mouth daily.  . sertraline (ZOLOFT) 100 MG  tablet Take 100 mg by mouth daily.    Marland Kitchen. tiotropium (SPIRIVA) 18 MCG inhalation capsule Place 18 mcg into inhaler and inhale daily.    . traMADol (ULTRAM) 50 MG tablet Take 50 mg by mouth every 6 (six) hours as needed for moderate pain.  . valsartan-hydrochlorothiazide (DIOVAN-HCT) 320-12.5 MG per tablet Take 1 tablet by mouth daily.    . Vitamin D, Ergocalciferol, (DRISDOL) 1.25 MG (50000 UT) CAPS capsule Take 50,000 Units by mouth every 7 (seven) days.  . [DISCONTINUED] loperamide (IMODIUM A-D) 2 MG capsule Take 2 mg by mouth as needed for diarrhea or loose  stools.   No facility-administered encounter medications on file as of 04/23/2019.     Review of Systems:  Review of Systems  Constitutional: Negative.   HENT: Negative.   Respiratory: Negative.   Cardiovascular: Positive for leg swelling.  Gastrointestinal: Positive for constipation and diarrhea.  Genitourinary: Negative.   Musculoskeletal: Positive for back pain.  Neurological: Negative.   Psychiatric/Behavioral: Negative.     Health Maintenance  Topic Date Due  . TETANUS/TDAP  04/08/1951  . DEXA SCAN  04/09/1999  . PNA vac Low Risk Adult (1 of 2 - PCV13) 04/09/1999  . INFLUENZA VACCINE  03/29/2019    Physical Exam: Vitals:   04/23/19 1627  BP: 130/68  Pulse: (!) 107  Resp: 20  Temp: (!) 97.3 F (36.3 C)  SpO2: 96%  Weight: 134 lb (60.8 kg)  Height: 5\' 1"  (1.549 m)   Body mass index is 25.32 kg/m. Physical Exam Vitals signs reviewed.  Constitutional:      Appearance: Normal appearance.  HENT:     Head: Normocephalic.     Nose: Nose normal.     Mouth/Throat:     Mouth: Mucous membranes are moist.     Pharynx: Oropharynx is clear.  Eyes:     Pupils: Pupils are equal, round, and reactive to light.  Neck:     Musculoskeletal: Neck supple.  Cardiovascular:     Rate and Rhythm: Normal rate and regular rhythm.     Heart sounds: Murmur present.  Pulmonary:     Effort: Pulmonary effort is normal.     Breath sounds: Normal breath sounds.  Abdominal:     General: Abdomen is flat. Bowel sounds are normal.     Palpations: Abdomen is soft.  Musculoskeletal:     Comments: Mild swelling Bilateral  Skin:    General: Skin is warm and dry.  Neurological:     General: No focal deficit present.     Mental Status: She is alert and oriented to person, place, and time.     Comments: Has very Slow gait. Steady  Psychiatric:        Mood and Affect: Mood normal.        Thought Content: Thought content normal.     Labs reviewed: Basic Metabolic Panel: Recent Labs     09/04/18 2058 09/05/18 0240 09/06/18 0300 09/07/18 0323 09/08/18 0532 09/09/18 0611  NA  --  120* 122* 127* 129* 132*  K  --  3.3* 3.9 3.6 3.4* 4.0  CL  --  88* 93* 99 96* 100  CO2  --  22 20* 22 24 24   GLUCOSE  --  92 111* 98 104* 96  BUN  --  24* 19 14 8 8   CREATININE  --  0.63 0.65 0.56 0.50 0.47  CALCIUM  --  7.8* 7.8* 7.3* 8.0* 8.2*  MG  --  1.8 2.1  --   --  2.1  PHOS 3.3 3.0  --   --   --   --   TSH  --  0.459  --   --   --   --    Liver Function Tests: Recent Labs    09/04/18 1655 09/05/18 0240  AST 25 19  ALT 27 22  ALKPHOS 64 54  BILITOT 1.2 0.9  PROT 6.8 5.6*  ALBUMIN 3.7 3.1*   Recent Labs    09/04/18 1655  LIPASE 37   No results for input(s): AMMONIA in the last 8760 hours. CBC: Recent Labs    09/04/18 1655  09/06/18 0300 09/07/18 0323 09/08/18 0532  WBC 15.0*   < > 11.3* 6.7 6.5  NEUTROABS 12.6*  --  8.6* 4.5  --   HGB 12.6   < > 11.6* 10.2* 10.6*  HCT 35.6*   < > 34.2* 30.1* 31.8*  MCV 84.8   < > 85.7 88.0 87.6  PLT 321   < > 358 260 299   < > = values in this interval not displayed.   Lipid Panel: No results for input(s): CHOL, HDL, LDLCALC, TRIG, CHOLHDL, LDLDIRECT in the last 8760 hours. No results found for: HGBA1C  Procedures since last visit: No results found.  Assessment/Plan .Hyponatremia On HCTZ and SSRI Repeat BMP  COPD On Inhalers Breo and Spiriva  Change in bowel habit Has follow up with Gastroenterology  Osteoporosis, On Reclast Will get Records from her PCP  Heart murmur Follows with Cardiology Has apointment  Chronic bilateral low back pain without sciatica On Tramadol Follows with Neurosurgery  Essential hypertension Controlled on Norvasc and Valsartan with HCTZ  Depression, recurrent ([place]) Stable on Zoloft     Labs/tests ordered:  * No order type specified * Next appt:  05/08/2019

## 2019-04-24 DIAGNOSIS — M810 Age-related osteoporosis without current pathological fracture: Secondary | ICD-10-CM | POA: Insufficient documentation

## 2019-04-24 DIAGNOSIS — G8929 Other chronic pain: Secondary | ICD-10-CM | POA: Insufficient documentation

## 2019-04-24 DIAGNOSIS — I10 Essential (primary) hypertension: Secondary | ICD-10-CM | POA: Insufficient documentation

## 2019-04-24 DIAGNOSIS — F339 Major depressive disorder, recurrent, unspecified: Secondary | ICD-10-CM | POA: Insufficient documentation

## 2019-04-30 ENCOUNTER — Encounter

## 2019-04-30 ENCOUNTER — Ambulatory Visit: Payer: Medicare Other | Admitting: Cardiology

## 2019-04-30 ENCOUNTER — Ambulatory Visit: Payer: Medicare Other | Admitting: Physician Assistant

## 2019-05-08 ENCOUNTER — Other Ambulatory Visit: Payer: Self-pay

## 2019-05-08 DIAGNOSIS — M810 Age-related osteoporosis without current pathological fracture: Secondary | ICD-10-CM

## 2019-05-08 DIAGNOSIS — R194 Change in bowel habit: Secondary | ICD-10-CM

## 2019-05-08 DIAGNOSIS — I10 Essential (primary) hypertension: Secondary | ICD-10-CM

## 2019-05-08 DIAGNOSIS — E871 Hypo-osmolality and hyponatremia: Secondary | ICD-10-CM

## 2019-05-08 LAB — CBC WITH DIFFERENTIAL/PLATELET
Absolute Monocytes: 461 cells/uL (ref 200–950)
Basophils Absolute: 51 cells/uL (ref 0–200)
Basophils Relative: 0.8 %
Eosinophils Absolute: 154 cells/uL (ref 15–500)
Eosinophils Relative: 2.4 %
HCT: 32.4 % — ABNORMAL LOW (ref 35.0–45.0)
Hemoglobin: 11 g/dL — ABNORMAL LOW (ref 11.7–15.5)
Lymphs Abs: 1555 cells/uL (ref 850–3900)
MCH: 29.7 pg (ref 27.0–33.0)
MCHC: 34 g/dL (ref 32.0–36.0)
MCV: 87.6 fL (ref 80.0–100.0)
MPV: 9.4 fL (ref 7.5–12.5)
Monocytes Relative: 7.2 %
Neutro Abs: 4179 cells/uL (ref 1500–7800)
Neutrophils Relative %: 65.3 %
Platelets: 302 10*3/uL (ref 140–400)
RBC: 3.7 10*6/uL — ABNORMAL LOW (ref 3.80–5.10)
RDW: 13.1 % (ref 11.0–15.0)
Total Lymphocyte: 24.3 %
WBC: 6.4 10*3/uL (ref 3.8–10.8)

## 2019-05-08 LAB — COMPLETE METABOLIC PANEL WITH GFR
AG Ratio: 1.7 (calc) (ref 1.0–2.5)
ALT: 11 U/L (ref 6–29)
AST: 18 U/L (ref 10–35)
Albumin: 4 g/dL (ref 3.6–5.1)
Alkaline phosphatase (APISO): 59 U/L (ref 37–153)
BUN/Creatinine Ratio: 25 (calc) — ABNORMAL HIGH (ref 6–22)
BUN: 25 mg/dL (ref 7–25)
CO2: 29 mmol/L (ref 20–32)
Calcium: 9 mg/dL (ref 8.6–10.4)
Chloride: 98 mmol/L (ref 98–110)
Creat: 0.99 mg/dL — ABNORMAL HIGH (ref 0.60–0.88)
GFR, Est African American: 60 mL/min/{1.73_m2} (ref >=60)
GFR, Est Non African American: 52 mL/min/{1.73_m2} — ABNORMAL LOW (ref >=60)
Globulin: 2.3 g/dL (calc) (ref 1.9–3.7)
Glucose, Bld: 101 mg/dL — ABNORMAL HIGH (ref 65–99)
Potassium: 3.7 mmol/L (ref 3.5–5.3)
Sodium: 136 mmol/L (ref 135–146)
Total Bilirubin: 0.5 mg/dL (ref 0.2–1.2)
Total Protein: 6.3 g/dL (ref 6.1–8.1)

## 2019-05-08 LAB — TSH: TSH: 1.11 mIU/L (ref 0.40–4.50)

## 2019-05-08 LAB — LIPID PANEL
Cholesterol: 175 mg/dL (ref <200)
HDL: 65 mg/dL (ref >=50)
LDL Cholesterol (Calc): 91 mg/dL (calc)
Non-HDL Cholesterol (Calc): 110 mg/dL (calc) (ref <130)
Total CHOL/HDL Ratio: 2.7 (calc) (ref <5.0)
Triglycerides: 101 mg/dL (ref <150)

## 2019-05-08 LAB — VITAMIN D 25 HYDROXY (VIT D DEFICIENCY, FRACTURES): Vit D, 25-Hydroxy: 96 ng/mL (ref 30–100)

## 2019-05-21 ENCOUNTER — Other Ambulatory Visit: Payer: Self-pay

## 2019-05-21 ENCOUNTER — Ambulatory Visit (INDEPENDENT_AMBULATORY_CARE_PROVIDER_SITE_OTHER): Payer: Medicare Other | Admitting: Gastroenterology

## 2019-05-21 ENCOUNTER — Encounter: Payer: Self-pay | Admitting: Gastroenterology

## 2019-05-21 VITALS — BP 122/60 | HR 73 | Temp 97.4°F | Ht 63.0 in | Wt 131.1 lb

## 2019-05-21 DIAGNOSIS — R634 Abnormal weight loss: Secondary | ICD-10-CM

## 2019-05-21 DIAGNOSIS — R152 Fecal urgency: Secondary | ICD-10-CM

## 2019-05-21 DIAGNOSIS — K219 Gastro-esophageal reflux disease without esophagitis: Secondary | ICD-10-CM

## 2019-05-21 DIAGNOSIS — R630 Anorexia: Secondary | ICD-10-CM

## 2019-05-21 DIAGNOSIS — K582 Mixed irritable bowel syndrome: Secondary | ICD-10-CM | POA: Diagnosis not present

## 2019-05-21 DIAGNOSIS — E739 Lactose intolerance, unspecified: Secondary | ICD-10-CM

## 2019-05-21 NOTE — Patient Instructions (Signed)
Take Amitiza 8 mcg twice daily, Samples given  Take Fiberchoice 1 tablet daily   Lactose-Free Diet, Adult If you have lactose intolerance, you are not able to digest lactose. Lactose is a natural sugar found mainly in dairy milk and dairy products. You may need to avoid all foods and beverages that contain lactose. A lactose-free diet can help you do this. Which foods have lactose? Lactose is found in dairy milk and dairy products, such as:  Yogurt.  Cheese.  Butter.  Margarine.  Sour cream.  Cream.  Whipped toppings and nondairy creamers.  Ice cream and other dairy-based desserts. Lactose is also found in foods or products made with dairy milk or milk ingredients. To find out whether a food contains dairy milk or a milk ingredient, look at the ingredients list. Avoid foods with the statement "May contain milk" and foods that contain:  Milk powder.  Whey.  Curd.  Caseinate.  Lactose.  Lactalbumin.  Lactoglobulin. What are alternatives to dairy milk and foods made with milk products?  Lactose-free milk.  Soy milk with added calcium and vitamin D.  Almond milk, coconut milk, rice milk, or other nondairy milk alternatives with added calcium and vitamin D. Note that these are low in protein.  Soy products, such as soy yogurt, soy cheese, soy ice cream, and soy-based sour cream.  Other nut milk products, such as almond yogurt, almond cheese, cashew yogurt, cashew cheese, cashew ice cream, coconut yogurt, and coconut ice cream. What are tips for following this plan?  Do not consume foods, beverages, vitamins, minerals, or medicines containing lactose. Read ingredient lists carefully.  Look for the words "lactose-free" on labels.  Use lactase enzyme drops or tablets as directed by your health care provider.  Use lactose-free milk or a milk alternative, such as soy milk or almond milk, for drinking and cooking.  Make sure you get enough calcium and vitamin D in  your diet. A lactose-free eating plan can be lacking in these important nutrients.  Take calcium and vitamin D supplements as directed by your health care provider. Talk to your health care provider about supplements if you are not able to get enough calcium and vitamin D from food. What foods can I eat?  Fruits All fresh, canned, frozen, or dried fruits that are not processed with lactose. Vegetables All fresh, frozen, and canned vegetables without cheese, cream, or butter sauces. Grains Any that are not made with dairy milk or dairy products. Meats and other proteins Any meat, fish, poultry, and other protein sources that are not made with dairy milk or dairy products. Soy cheese and yogurt. Fats and oils Any that are not made with dairy milk or dairy products. Beverages Lactose-free milk. Soy, rice, or almond milk with added calcium and vitamin D. Fruit and vegetable juices. Sweets and desserts Any that are not made with dairy milk or dairy products. Seasonings and condiments Any that are not made with dairy milk or dairy products. Calcium Calcium is found in many foods that contain lactose and is important for bone health. The amount of calcium you need depends on your age:  Adults younger than 50 years: 1,000 mg of calcium a day.  Adults older than 50 years: 1,200 mg of calcium a day. If you are not getting enough calcium, you may get it from other sources, including:  Orange juice with calcium added. There are 300-350 mg of calcium in 1 cup of orange juice.  Calcium-fortified soy milk. There are 300-400  mg of calcium in 1 cup of calcium-fortified soy milk.  Calcium-fortified rice or almond milk. There are 300 mg of calcium in 1 cup of calcium-fortified rice or almond milk.  Calcium-fortified breakfast cereals. There are 100-1,000 mg of calcium in calcium-fortified breakfast cereals.  Spinach, cooked. There are 145 mg of calcium in  cup of cooked spinach.  Edamame,  cooked. There are 130 mg of calcium in  cup of cooked edamame.  Collard greens, cooked. There are 125 mg of calcium in  cup of cooked collard greens.  Kale, frozen or cooked. There are 90 mg of calcium in  cup of cooked or frozen kale.  Almonds. There are 95 mg of calcium in  cup of almonds.  Broccoli, cooked. There are 60 mg of calcium in 1 cup of cooked broccoli. The items listed above may not be a complete list of recommended foods and beverages. Contact a dietitian for more options. What foods are not recommended? Fruits None, unless they are made with dairy milk or dairy products. Vegetables None, unless they are made with dairy milk or dairy products. Grains Any grains that are made with dairy milk or dairy products. Meats and other proteins None, unless they are made with dairy milk or dairy products. Dairy All dairy products, including milk, goat's milk, buttermilk, kefir, acidophilus milk, flavored milk, evaporated milk, condensed milk, dulce de [place], eggnog, yogurt, cheese, and cheese spreads. Fats and oils Any that are made with milk or milk products. Margarines and salad dressings that contain milk or cheese. Cream. Half and half. Cream cheese. Sour cream. Chip dips made with sour cream or yogurt. Beverages Hot chocolate. Cocoa with lactose. Instant iced teas. Powdered fruit drinks. Smoothies made with dairy milk or yogurt. Sweets and desserts Any that are made with milk or milk products. Seasonings and condiments Chewing gum that has lactose. Spice blends if they contain lactose. Artificial sweeteners that contain lactose. Nondairy creamers. The items listed above may not be a complete list of foods and beverages to avoid. Contact a dietitian for more information. Summary  If you are lactose intolerant, it means that you have a hard time digesting lactose, a natural sugar found in milk and milk products.  Following a lactose-free diet can help you manage this  condition.  Calcium is important for bone health and is found in many foods that contain lactose. Talk with your health care provider about other sources of calcium. This information is not intended to replace advice given to you by your health care provider. Make sure you discuss any questions you have with your health care provider. Document Released: 02/03/2002 Document Revised: 09/11/2017 Document Reviewed: 09/11/2017 Elsevier Patient Education  [place].  I appreciate the  opportunity to care for you  Thank You   Harl Bowie , MD

## 2019-05-21 NOTE — Progress Notes (Signed)
Robin [name]    [id number]    February 17, 1932  Primary Care Physician:Gupta, Robin Breech, MD  Referring Physician: Sigmund Hazel, MD [address] [place],  [place] [address]   Chief complaint: Decreased appetite, weight loss, constipation diarrhea  HPI:  85 year old female last seen by Robin [name] in December 2016 here to reestablish care.  She has lost over 20 pounds in the past 6-12 months, feels most of it happened when she was hospitalized in January 2020 and soon after. She also moved to friend's home [place] in April 2020, is still trying to adjust to the meal plans and the menu.  She feels the food is not very appetizing the way to make it, compared to what she was doing when she was at home.  She has alternating constipation and diarrhea with abdominal bloating, fecal urgency and occasional episodes of fecal incontinence if she does not make it to the bathroom in time  Reviewed recent labs CT abd & pelvis 10/17/18: No acute abnormality  Colonoscopy 10/31/2010 by Dr Arlyce Dice: Sigmoid diverticulosis and internal hemorrhoids otherwise normal.   Outpatient Encounter Medications as of 05/21/2019  Medication Sig  . albuterol (PROVENTIL HFA;VENTOLIN HFA) 108 (90 Base) MCG/ACT inhaler Inhale 2 puffs into the lungs every 4 (four) hours as needed for wheezing or shortness of breath.  Marland Kitchen alum & mag hydroxide-simeth (MAALOX/MYLANTA) 200-200-20 MG/5ML suspension Take 30 mLs by mouth as needed for indigestion or heartburn.  Marland Kitchen amLODipine (NORVASC) 2.5 MG tablet Take 2.5 mg by mouth daily.  Marland Kitchen aspirin 81 MG tablet Take 81 mg by mouth daily.  . fluticasone (FLONASE) 50 MCG/ACT nasal spray Place 1 spray into both nostrils as needed.   . fluticasone furoate-vilanterol (BREO ELLIPTA) 100-25 MCG/INH AEPB Inhale 1 puff into the lungs daily.  Marland Kitchen lovastatin (MEVACOR) 40 MG tablet Take 40 mg by mouth daily.   . pantoprazole (PROTONIX) 40 MG tablet Take 1 tablet (40 mg total) by mouth daily.  .  sertraline (ZOLOFT) 100 MG tablet Take 100 mg by mouth daily.    Marland Kitchen tiotropium (SPIRIVA) 18 MCG inhalation capsule Place 18 mcg into inhaler and inhale daily.    . traMADol (ULTRAM) 50 MG tablet Take 50 mg by mouth every 6 (six) hours as needed for moderate pain.  . valsartan-hydrochlorothiazide (DIOVAN-HCT) 320-12.5 MG per tablet Take 1 tablet by mouth daily.    . Vitamin D, Ergocalciferol, (DRISDOL) 1.25 MG (50000 UT) CAPS capsule Take 50,000 Units by mouth every 7 (seven) days.   No facility-administered encounter medications on file as of 05/21/2019.     Allergies as of 05/21/2019 - Review Complete 04/23/2019  Allergen Reaction Noted  . Fosamax [alendronate sodium]  12/20/2017  . Augmentin [amoxicillin-pot clavulanate] Nausea Only 12/16/2015  . Hydrocodone Nausea And Vomiting 08/29/2018  . Mobic [meloxicam] Rash 08/29/2018  . Sulfonamide derivatives Rash     Past Medical History:  Diagnosis Date  . Arthritis   . Depression   . Diverticulosis   . GERD (gastroesophageal reflux disease)   . Heart murmur   . Hyperlipemia   . Hypertension   . Irregular heart beat 2014   patient states she has not heard back about echo.   . Osteoporosis     Past Surgical History:  Procedure Laterality Date  . ABDOMINAL HYSTERECTOMY     both appy and hyst done together  . APPENDECTOMY      Family History  Problem Relation Age of  Onset  . Lung cancer Father   . Cancer Sister        biliary    Social History   Socioeconomic History  . Marital status: Married    Spouse name: Not on file  . Number of children: 2  . Years of education: Not on file  . Highest education level: Not on file  Occupational History  . Occupation: retired  Scientific laboratory technician  . Financial resource strain: Not on file  . Food insecurity    Worry: Not on file    Inability: Not on file  . Transportation needs    Medical: Not on file    Non-medical: Not on file  Tobacco Use  . Smoking status: Former Smoker     Quit date: 08/28/2008    Years since quitting: 10.7  . Smokeless tobacco: Never Used  Substance and Sexual Activity  . Alcohol use: Yes    Alcohol/week: 7.0 standard drinks    Types: 7 drink(s) per week    Comment: glass of wine daily  . Drug use: No  . Sexual activity: Not on file  Lifestyle  . Physical activity    Days per week: Not on file    Minutes per session: Not on file  . Stress: Not on file  Relationships  . Social Herbalist on phone: Not on file    Gets together: Not on file    Attends religious service: Not on file    Active member of club or organization: Not on file    Attends meetings of clubs or organizations: Not on file    Relationship status: Not on file  . Intimate partner violence    Fear of current or ex partner: Not on file    Emotionally abused: Not on file    Physically abused: Not on file    Forced sexual activity: Not on file  Other Topics Concern  . Not on file  Social History Narrative  . Not on file      Review of systems: Review of Systems  Constitutional: Negative for fever and chills.  HENT: Negative.   Eyes: Negative for blurred vision.  Respiratory: Negative for cough, shortness of breath and wheezing.   Cardiovascular: Negative for chest pain and palpitations.  Gastrointestinal: as per HPI Genitourinary: Negative for dysuria, urgency, frequency and hematuria.  Musculoskeletal: Positive for myalgias, back pain and joint pain.  Skin: Negative for itching and rash.  Neurological: Negative for dizziness, tremors, focal weakness, seizures and loss of consciousness.  Endo/Heme/Allergies: Negative Psychiatric/Behavioral: Negative for depression, suicidal ideas and hallucinations.  All other systems reviewed and are negative.   Physical Exam: Vitals:   05/21/19 1030  BP: 122/60  Pulse: 73  Temp: (!) 97.4 F (36.3 C)   Body mass index is 23.23 kg/m. Gen:      No acute distress HEENT:  EOMI, sclera anicteric Neck:      No masses; no thyromegaly Lungs:    Clear to auscultation bilaterally; normal respiratory effort CV:         Regular rate and rhythm; no murmurs Abd:      + bowel sounds; soft, non-tender; no palpable masses, no distension Ext:    No edema; adequate peripheral perfusion Skin:      Warm and dry; no rash Neuro: alert and oriented x 3 Psych: normal mood and affect  Data Reviewed:  Reviewed labs, radiology imaging, old records and pertinent past GI work up   Assessment  and Plan/Recommendations:  85 year old female with history of chronic GERD with decreased appetite and progressive weight loss since hospitalization in January 2020  IBS with alternating constipation and diarrhea Start Amitiza 8 mcg twice daily Fiberchoice 1 to 2 tablets daily Increase water intake  Intermittent fecal urgency and diarrhea could be secondary to lactose intolerance, will do a trial of lactose-free diet  Small frequent meals  GERD: Continue Protonix and antireflux measures  Return in 2 to 3 months or sooner if needed  K. Scherry Ran , MD    CC: Robin Hazel, MD

## 2019-05-27 ENCOUNTER — Telehealth: Payer: Self-pay | Admitting: Gastroenterology

## 2019-05-27 MED ORDER — LUBIPROSTONE 8 MCG PO CAPS: 8.0000 ug | ORAL_CAPSULE | Freq: Two times a day (BID) | ORAL | 3 refills | Status: DC

## 2019-05-27 NOTE — Telephone Encounter (Signed)
Amitiza sent to pharmacy as requested

## 2019-06-11 ENCOUNTER — Encounter: Payer: Self-pay | Admitting: Cardiology

## 2019-06-11 ENCOUNTER — Ambulatory Visit: Payer: Medicare Other | Admitting: Cardiology

## 2019-06-11 ENCOUNTER — Other Ambulatory Visit: Payer: Self-pay

## 2019-06-11 VITALS — BP 140/60 | HR 84 | Temp 98.0°F | Ht 62.0 in | Wt 132.0 lb

## 2019-06-11 DIAGNOSIS — I350 Nonrheumatic aortic (valve) stenosis: Secondary | ICD-10-CM

## 2019-06-11 DIAGNOSIS — I493 Ventricular premature depolarization: Secondary | ICD-10-CM | POA: Diagnosis not present

## 2019-06-11 DIAGNOSIS — I10 Essential (primary) hypertension: Secondary | ICD-10-CM | POA: Diagnosis not present

## 2019-06-11 HISTORY — DX: Nonrheumatic aortic (valve) stenosis: I35.0

## 2019-06-11 NOTE — Patient Instructions (Addendum)
Medication Instructions:  NO CHANGES  If you need a refill on your cardiac medications before your next appointment, please call your pharmacy.   Lab work: NOT NEEDED   Testing/Procedures: WILL BE SCHEDULE [place] [address] Your physician has requested that you have an echocardiogram. Echocardiography is a painless test that uses sound waves to create images of your heart. It provides your doctor with information about the size and shape of your heart and how well your heart's chambers and valves are working. This procedure takes approximately one hour. There are no restrictions for this procedure.   Follow-Up: At [facility], you and your health needs are our priority.  As part of our continuing mission to provide you with exceptional heart care, we have created designated Provider Care Teams.  These Care Teams include your primary Cardiologist (physician) and Advanced Practice Providers (APPs -  Physician Assistants and Nurse Practitioners) who all work together to provide you with the care you need, when you need it. . You will need a follow up appointment in  6  Months -April 2021.  Please call our office 2 months in advance to schedule this appointment.  You may see Glenetta Hew, MD or one of the following Advanced Practice Providers on your designated Care Team:   . Rosaria Ferries, PA-C . Jory Sims, DNP, ANP  Any Other Special Instructions Will Be Listed Below (If Applicable).

## 2019-06-11 NOTE — Progress Notes (Signed)
PCP: Mahlon Gammon, MD Former Cardiologist: Dr. Georga Hacking  Clinic Note: Chief Complaint  Patient presents with  . New Patient (Initial Visit)    Former Dr. Donnie Aho patient  . Aortic Stenosis  . Palpitations    By Dr. York Spaniel note, reported PVCs    HPI:    Robin [name] is a 85 y.o. female who is being seen today at the request of Mahlon Gammon, MD & Sigmund Hazel, MD in order to reestablish cardiology care to follow aortic stenosis.  Robin [name] was last seen on December 17, 2017 by Dr.Tilley for follow-up of moderate aortic stenosis as of 2018.  He indicated that she had been diagnosed with exertional dyspnea secondary to COPD.  She had had an episode of falling which was likely not syncopal.  He notes that she has significant back pain.  Also, caring for for her husband has become quite stressful.  No PND, orthopnea or edema.  No chest pain or pressure with rest or exertion.  Recent Hospitalizations:  -=- HypoNatremia Sep 04, 2018  Reviewed  CV studies:   The following studies were reviewed today: (if available, images/films reviewed: From Epic Chart or Care Everywhere) . Reviewed Dr. York Spaniel note that indicated echocardiogram most recently done in April 2018 with EF of 60%.  Normal wall motion.  Moderate LAE.  Moderate aortic stenosis and regurgitation (mean gradient 22.9 mmHg.  Peak gradient 40.2 mmHg).  GRII MR with mildly restricted leaflets.  Mild left and moderate TR. Marland Kitchen Holter monitor October 2014 showed sinus rhythm with PVCs.   Interval History:   Robin [name] presents here today to establish cardiology care.  She and her new husband [name]") recently moved to [facility] just prior to the outbreak of the pandemic.  Unfortunately, they are not able to partake of all the activities that are there.  She had been doing PT with plans of then follow that up there at friend's home, but this got canceled due to COVID-19.  She does have a pretty unsteady gait and  has to either use a cane for short distances or with a walker.  This is really because of her bad back and poor balance.  As result of this, she really is not all that active, and is very upset about not being on to do her rehab.  She was not overly aware of what was going on with her heart but knew that she had a murmur.  She has not had any symptoms to speak of of chest tightness or pressure with rest or exertion.  Walking is hard for her and therefore she gets short of breath, but thinks this is more because of her arthritis and back pain.  She denies any PND or orthopnea, but does have some mild end of day edema.  She describes that she is somewhat unsteady and dizzy when she first gets up to stand up.  She then also has does have a little decreased "get up and go".    She says she feels every now and then occasional palpitations but nothing significant.  No rapid irregular heartbeats or palpitations.  No syncope/near syncope unless she stands up too quickly and gets dizzy.  No further falls.  No TIA or amaurosis fugax symptoms in the last year plus since last visit with Dr. Donnie Aho.  Cardiovascular review of symptoms: positive for - dyspnea on exertion, palpitations and Minimal, noted in HPI negative for - chest pain, edema, orthopnea,  paroxysmal nocturnal dyspnea or TIA/amaurosis fugax, syncope/near syncope, claudication Very slow, unsteady gait.  Significant decreased mobility.  The patient does not have symptoms concerning for COVID-19 infection (fever, chills, cough, or new shortness of breath).  The patient is practicing social distancing. ++ Masking.  Does not do worry about groceries/shopping--> meals delivered at [facility]  REVIEWED OF SYSTEMS   ROS: A comprehensive was performed. Review of Systems  Constitutional: Negative for chills, fever, malaise/fatigue (Just now about a get up and go.  Does not do much.) and weight loss.  HENT: Positive for hearing loss. Negative for  congestion and nosebleeds.   Respiratory: Negative for cough, shortness of breath and wheezing.   Gastrointestinal: Positive for constipation and diarrhea.       Intermittent constipation and diarrhea/loose stools  Musculoskeletal: Positive for back pain (Diffuse) and joint pain (Mostly hips). Negative for falls (None recently).  Neurological: Positive for dizziness (If she stands up fast) and weakness (She feels like she has bilateral leg weakness). Negative for loss of consciousness.       Poor balance  Psychiatric/Behavioral: Positive for depression. Negative for memory loss. The patient is nervous/anxious. The patient does not have insomnia.        She does have stress as a caregiver    I have reviewed and (if needed) personally updated the patient's problem list, medications, allergies, past medical and surgical history, social and family history.   PAST MEDICAL HISTORY   Past Medical History:  Diagnosis Date  . Arthritis   . COPD (chronic obstructive pulmonary disease) (HCC)   . Depression   . Diverticulosis   . GERD (gastroesophageal reflux disease)   . Heart murmur   . Hyperlipemia   . Hypertension   . Irregular heart beat 2014   PVCs  . Moderate aortic stenosis by prior echocardiogram 06/11/2019   Echo April 2018: EF 60%.  Moderate aortic stenosis (details not available)  . Osteoporosis      PAST SURGICAL HISTORY   Past Surgical History:  Procedure Laterality Date  . ABDOMINAL HYSTERECTOMY     both appy and hyst done together  . APPENDECTOMY    . CATARACT EXTRACTION       MEDICATIONS/ALLERGIES   Current Meds  Medication Sig  . acetaminophen (TYLENOL) 650 MG CR tablet Take 650 mg by mouth 2 (two) times daily. 2 tablets twice daily  . albuterol (PROVENTIL HFA;VENTOLIN HFA) 108 (90 Base) MCG/ACT inhaler Inhale 2 puffs into the lungs every 4 (four) hours as needed for wheezing or shortness of breath.  Marland Kitchen alum & mag hydroxide-simeth (MAALOX/MYLANTA) 200-200-20  MG/5ML suspension Take 30 mLs by mouth as needed for indigestion or heartburn.  Marland Kitchen amLODipine (NORVASC) 2.5 MG tablet Take 2.5 mg by mouth daily.  Marland Kitchen aspirin 81 MG tablet Take 81 mg by mouth daily.  . fluticasone (FLONASE) 50 MCG/ACT nasal spray Place 1 spray into both nostrils as needed.   . fluticasone furoate-vilanterol (BREO ELLIPTA) 100-25 MCG/INH AEPB Inhale 1 puff into the lungs daily.  Marland Kitchen lovastatin (MEVACOR) 40 MG tablet Take 40 mg by mouth daily.   Marland Kitchen lubiprostone (AMITIZA) 8 MCG capsule Take 1 capsule (8 mcg total) by mouth 2 (two) times daily with a meal.  . pantoprazole (PROTONIX) 40 MG tablet Take 1 tablet (40 mg total) by mouth daily.  . sertraline (ZOLOFT) 100 MG tablet Take 100 mg by mouth daily.    Marland Kitchen tiotropium (SPIRIVA) 18 MCG inhalation capsule Place 18 mcg into inhaler and inhale  daily.    . traMADol (ULTRAM) 50 MG tablet Take 50 mg by mouth every 6 (six) hours as needed for moderate pain.  . valsartan-hydrochlorothiazide (DIOVAN-HCT) 320-12.5 MG per tablet Take 1 tablet by mouth daily.    . Vitamin D, Ergocalciferol, (DRISDOL) 1.25 MG (50000 UT) CAPS capsule Take 50,000 Units by mouth every 7 (seven) days.    Allergies  Allergen Reactions  . Fosamax [Alendronate Sodium]     Poor healing to mouth wound  . Augmentin [Amoxicillin-Pot Clavulanate] Nausea Only    DID THE REACTION INVOLVE: Swelling of the face/tongue/throat, SOB, or low BP? No Sudden or severe rash/hives, skin peeling, or the inside of the mouth or nose? No Did it require medical treatment? No When did it last happen? If all above answers are "NO", may proceed with cephalosporin use.   Marland Kitchen Hydrocodone Nausea And Vomiting  . Mobic [Meloxicam] Rash  . Sulfonamide Derivatives Rash     SOCIAL HISTORY/FAMILY HISTORY   Social History   Tobacco Use  . Smoking status: Former Smoker    Packs/day: 1.00    Years: 30.00    Pack years: 30.00    Types: Cigarettes    Quit date: 08/28/2008    Years since  quitting: 10.7  . Smokeless tobacco: Never Used  Substance Use Topics  . Alcohol use: Yes    Alcohol/week: 14.0 standard drinks    Types: 14 Standard drinks or equivalent per week    Comment: 1-2 glass of wine or mixed drink daily  . Drug use: No   Social History   Social History Narrative   Currently remarried.  New husband has significant cardiac history.  (Followed by Dr. Herbie Baltimore)   She is a retired Runner, broadcasting/film/video.    family history includes Cancer in her sister and sister; Gastric cancer in her mother; Hypertension in her mother; Lung cancer in her father.   OBJCTIVE -PE, EKG, labs   Wt Readings from Last 3 Encounters:  06/11/19 132 lb (59.9 kg)  05/21/19 131 lb 2 oz (59.5 kg)  04/23/19 134 lb (60.8 kg)    Physical Exam: BP 140/60 (BP Location: Left Arm, Patient Position: Sitting, Cuff Size: Normal)   Pulse 84   Temp 98 F (36.7 C)   Ht  (1.575 m)   Wt 132 lb (59.9 kg)   BMI 24.14 kg/m  Physical Exam  Constitutional: She is oriented to person, place, and time. She appears well-developed and well-nourished. No distress.  Relatively healthy-appearing elderly woman.  Well-groomed  HENT:  Head: Normocephalic and atraumatic.  Eyes: Pupils are equal, round, and reactive to light. EOM are normal.  Neck: Normal range of motion. Neck supple. No hepatojugular reflux and no JVD present. Carotid bruit is present (Bilateral bruit versus radiated aortic murmur).  Cardiovascular: Normal rate, regular rhythm and intact distal pulses.  Occasional extrasystoles are present. PMI is not displaced. Exam reveals no gallop and no friction rub.  Murmur heard. High-pitched harsh crescendo-decrescendo mid to late systolic murmur is present with a grade of 3/6 at the upper right sternal border radiating to the neck. Pulmonary/Chest: Effort normal and breath sounds normal. No respiratory distress. She has no wheezes. She has no rales.  Abdominal: Soft. Bowel sounds are normal. She exhibits no  distension. There is no abdominal tenderness. There is no rebound.  Musculoskeletal: Normal range of motion.        General: No edema (Trivial).  Neurological: She is alert and oriented to person, place, and time. No  cranial nerve deficit.  Hard of hearing  Skin: Skin is warm and dry. No erythema.  Psychiatric: She has a normal mood and affect. Her behavior is normal. Judgment and thought content normal.  Vitals reviewed.   Adult ECG Report  Rate: 84 ;  Rhythm: normal sinus rhythm and 1 degree AVB.  Cannot exclude anterior MI age undetermined.;   Narrative Interpretation: No prior EKG to review  Recent Labs:   Lab Results  Component Value Date   CREATININE 0.99 (H) 05/08/2019   BUN 25 05/08/2019   NA 136 05/08/2019   K 3.7 05/08/2019   CL 98 05/08/2019   CO2 29 05/08/2019   Lab Results  Component Value Date   CHOL 175 05/08/2019   HDL 65 05/08/2019   LDLCALC 91 05/08/2019   TRIG 101 05/08/2019   CHOLHDL 2.7 05/08/2019   Lab Results  Component Value Date   WBC 6.4 05/08/2019   HGB 11.0 (L) 05/08/2019   HCT 32.4 (L) 05/08/2019   MCV 87.6 05/08/2019   PLT 302 05/08/2019    ASSESSMENT/PLAN    Problem List Items Addressed This Visit    Moderate aortic stenosis by prior echocardiogram - Primary (Chronic)    Last evaluation was in 2018.  She was post have an echo done in 2019, but that was not completed.  We will go ahead and check a new baseline 2D echo to follow her moderate stenosis.      Relevant Orders   EKG 12-Lead (Completed)   ECHOCARDIOGRAM COMPLETE   Essential hypertension (Chronic)    Borderline blood pressures today, is on low-dose amlodipine with max dose Diovan-HCTZ. Not on beta-blocker.      PVC's (premature ventricular contractions) (Chronic)    She does not really seem to notice that much in the way of any palpitations.  Is not currently on beta-blocker.  Will hold off for now.          COVID-19 Education: The signs and symptoms of COVID-19  were discussed with the patient and how to seek care for testing (follow up with PCP or arrange E-visit).   The importance of social distancing was discussed today.  I spent a total of 26minutes with the patient and chart review. >  50% of the time was spent in direct patient consultation.  Additional time spent with chart review (studies, outside notes, etc): 15 Total Time: 41 min   Current medicines are reviewed at length with the patient today.  (+/- concerns) none   Patient Instructions / Medication Changes & Studies & Tests Ordered   Patient Instructions  Medication Instructions:  NO CHANGES  If you need a refill on your cardiac medications before your next appointment, please call your pharmacy.   Lab work: NOT NEEDED   Testing/Procedures: WILL BE SCHEDULE [address] Your physician has requested that you have an echocardiogram. Echocardiography is a painless test that uses sound waves to create images of your heart. It provides your doctor with information about the size and shape of your heart and how well your heart's chambers and valves are working. This procedure takes approximately one hour. There are no restrictions for this procedure.   Follow-Up: At [facility], you and your health needs are our priority.  As part of our continuing mission to provide you with exceptional heart care, we have created designated Provider Care Teams.  These Care Teams include your primary Cardiologist (physician) and Advanced Practice Providers (APPs -  Physician  Assistants and Nurse Practitioners) who all work together to provide you with the care you need, when you need it. . You will need a follow up appointment in  6  Months -April 2021.  Please call our office 2 months in advance to schedule this appointment.  You may see Glenetta Hew, MD or one of the following Advanced Practice Providers on your designated Care Team:   . Rosaria Ferries, PA-C . Jory Sims, DNP, ANP  Any Other Special Instructions Will Be Listed Below (If Applicable).    Studies Ordered:   Orders Placed This Encounter  Procedures  . EKG 12-Lead  . ECHOCARDIOGRAM COMPLETE     Glenetta Hew, M.D., M.S. Interventional Cardiologist   Pager # [phone] Phone # [phone] [address] [place], [place] [address]   Thank you for choosing Heartcare at [facility]!!

## 2019-06-13 ENCOUNTER — Encounter: Payer: Self-pay | Admitting: Cardiology

## 2019-06-13 DIAGNOSIS — I493 Ventricular premature depolarization: Secondary | ICD-10-CM | POA: Insufficient documentation

## 2019-06-13 NOTE — Assessment & Plan Note (Signed)
Borderline blood pressures today, is on low-dose amlodipine with max dose Diovan-HCTZ. Not on beta-blocker.

## 2019-06-13 NOTE — Assessment & Plan Note (Signed)
Last evaluation was in 2018.  She was post have an echo done in 2019, but that was not completed.  We will go ahead and check a new baseline 2D echo to follow her moderate stenosis.

## 2019-06-13 NOTE — Assessment & Plan Note (Signed)
She does not really seem to notice that much in the way of any palpitations.  Is not currently on beta-blocker.  Will hold off for now.

## 2019-06-29 HISTORY — PX: TRANSTHORACIC ECHOCARDIOGRAM: SHX275

## 2019-07-03 ENCOUNTER — Other Ambulatory Visit (HOSPITAL_COMMUNITY): Payer: Medicare Other

## 2019-07-14 ENCOUNTER — Other Ambulatory Visit: Payer: Self-pay

## 2019-07-14 ENCOUNTER — Ambulatory Visit (HOSPITAL_COMMUNITY): Payer: Medicare Other | Attending: Cardiology

## 2019-07-14 DIAGNOSIS — I350 Nonrheumatic aortic (valve) stenosis: Secondary | ICD-10-CM | POA: Insufficient documentation

## 2019-07-15 ENCOUNTER — Telehealth: Payer: Self-pay | Admitting: *Deleted

## 2019-07-15 ENCOUNTER — Telehealth: Payer: Self-pay | Admitting: Cardiology

## 2019-07-15 NOTE — Telephone Encounter (Signed)
-----   Message from Leonie Man, MD sent at 07/14/2019 11:23 PM EST ----- Echocardiogram result was pretty unremarkable.  Ejection fraction is in the upper limit of normal of 60 to 65%.  The left ventricle does appear to be somewhat stiff with grade 2 diastolic dysfunction and moderately dilated left atrium.  This is really not all that abnormal for someone 85 years old.  Grade 1 diastolic dysfunction would be normal for age in a 83 year old, but we do not have great data for 80+ patients.  The aortic valve however is a little bit more concerning.  There is severe calcification with moderate stenosis and severe regurgitation.  This is relatively interesting, we were trying to evaluate progression of the aortic stenosis, but it actually regurgitation that is worse.  This is something that I think we probably should follow-up with symptoms to determine just how short of breath she truly is.  Management for a regurgitation his blood pressure control.  I did not get a sense in the clinic visit that exertional dyspnea is her limitation, so we may not be forced to rush right to surgical options.  Plan was for her to come back in 6 months, however based on these results we should probably have her come back sometime in December if possible if not early January to discuss results and further evaluation going forward.  Glenetta Hew, MD

## 2019-07-15 NOTE — Telephone Encounter (Signed)
New Message  Pt is calling about ECHO results  Please call

## 2019-07-15 NOTE — Telephone Encounter (Signed)
Left message to call back in regards to result and f/u appointment is need for dec 2020 or jan 2021

## 2019-07-15 NOTE — Telephone Encounter (Signed)
Reviewed echo result note with pt; summarized result note info and informed pt that Dr. Ellyn Hack would like pt to be set up for appt in Dec or Jan to discuss full results of echo in detail. Pt set up for appt 12/7 at 1:20 pm. Informed pt that Dr. Ellyn Hack requests that pt have a family member with her during appt. Pt verbalized understanding

## 2019-07-16 NOTE — Telephone Encounter (Signed)
SEE OTHER TELEPHONE NOTE 

## 2019-07-23 ENCOUNTER — Non-Acute Institutional Stay: Payer: Medicare Other | Admitting: Internal Medicine

## 2019-07-23 ENCOUNTER — Other Ambulatory Visit: Payer: Self-pay

## 2019-07-23 ENCOUNTER — Encounter: Payer: Self-pay | Admitting: Internal Medicine

## 2019-07-23 VITALS — BP 108/56 | HR 91 | Temp 98.1°F | Ht 62.0 in | Wt 135.2 lb

## 2019-07-23 DIAGNOSIS — M545 Low back pain, unspecified: Secondary | ICD-10-CM

## 2019-07-23 DIAGNOSIS — R194 Change in bowel habit: Secondary | ICD-10-CM

## 2019-07-23 DIAGNOSIS — R600 Localized edema: Secondary | ICD-10-CM | POA: Diagnosis not present

## 2019-07-23 DIAGNOSIS — F339 Major depressive disorder, recurrent, unspecified: Secondary | ICD-10-CM

## 2019-07-23 DIAGNOSIS — E871 Hypo-osmolality and hyponatremia: Secondary | ICD-10-CM

## 2019-07-23 DIAGNOSIS — I10 Essential (primary) hypertension: Secondary | ICD-10-CM

## 2019-07-23 DIAGNOSIS — M810 Age-related osteoporosis without current pathological fracture: Secondary | ICD-10-CM

## 2019-07-23 DIAGNOSIS — G8929 Other chronic pain: Secondary | ICD-10-CM

## 2019-07-23 MED ORDER — TRAMADOL HCL 50 MG PO TABS: 50.0000 mg | ORAL_TABLET | Freq: Four times a day (QID) | ORAL | 0 refills | Status: DC | PRN

## 2019-07-23 MED ORDER — FUROSEMIDE 20 MG PO TABS: 20.0000 mg | ORAL_TABLET | ORAL | 0 refills | Status: DC

## 2019-07-23 NOTE — Progress Notes (Signed)
Location:  [facility] Service:  Clinic (12)  Provider:   Code Status:  Goals of Care:  Advanced Directives 09/04/2018  Does Patient Have a Medical Advance Directive? Yes  Type of Paramedic of [place];Living will  Does patient want to make changes to medical advance directive? No - Patient declined  Copy of [place] in Chart? No - copy requested     Chief Complaint  Patient presents with  . Medical Management of Chronic Issues    3 month follow up. Patient would like to discuss some emotional issues and swollen ankles and feet. She recently had a echocardiogram.    HPI: Patient is a 85 y.o. female seen today for medical management of chronic diseases.  Bilateral LE edema Has gained some weight and has worsening recently Denies any Dyspnea or PND Had Echo done and follows with Cardiology  Has appointment to discuss her AR with AS Depression Her husband has Parkinson Has been having issues with his confusion and disability  Other issues Hyponatremia Was admitted in 1/20 with Sodium of 116. Responded to hydration was thought to be due to Dehydration and UTI Repeat Sodium has been stable Hypertension On valsartan/HCTZ and Norvasc  COPD On Inhalers Chronic Back Pain  on Tramadol PRn Takes 1 a day Does not want any nore Epidural shots Unsteady gait Has not fallen recently. Walks very slowly with [employer]. Still drives Osteoporosis On Reclast since 2013 I do not have Recent Imaging for Bone scan Alternate Diarrhea and Constipation Is going to follow with GI Last Colonoscopy was in 2001  Recently has moved to friend's home.  Lives with her husband in [place].  Walks with cane.  Is independent in her ADLs and IADLs and husband has Parkinson disease  Past Medical History:  Diagnosis Date  . Arthritis   . COPD (chronic obstructive pulmonary disease) ([place])   . Depression   . Diverticulosis   . GERD  (gastroesophageal reflux disease)   . Heart murmur   . Hyperlipemia   . Hypertension   . Irregular heart beat 2014   PVCs  . Moderate aortic stenosis by prior echocardiogram 06/11/2019   Echo April 2018: EF 60%.  Moderate aortic stenosis (details not available)  . Osteoporosis     Past Surgical History:  Procedure Laterality Date  . ABDOMINAL HYSTERECTOMY     both appy and hyst done together  . APPENDECTOMY    . CATARACT EXTRACTION      Allergies  Allergen Reactions  . Fosamax [Alendronate Sodium]     Poor healing to mouth wound  . Augmentin [Amoxicillin-Pot Clavulanate] Nausea Only    DID THE REACTION INVOLVE: Swelling of the face/tongue/throat, SOB, or low BP? No Sudden or severe rash/hives, skin peeling, or the inside of the mouth or nose? No Did it require medical treatment? No When did it last happen? If all above answers are "NO", may proceed with cephalosporin use.   Marland Kitchen Hydrocodone Nausea And Vomiting  . Mobic [Meloxicam] Rash  . Sulfonamide Derivatives Rash    Outpatient Encounter Medications as of 07/23/2019  Medication Sig  . acetaminophen (TYLENOL) 650 MG CR tablet Take 650 mg by mouth 2 (two) times daily. 2 tablets twice daily  . albuterol (PROVENTIL HFA;VENTOLIN HFA) 108 (90 Base) MCG/ACT inhaler Inhale 2 puffs into the lungs every 4 (four) hours as needed for wheezing or shortness of breath.  Marland Kitchen amLODipine (NORVASC) 2.5 MG tablet Take 2.5  mg by mouth daily.  Marland Kitchen aspirin 81 MG tablet Take 81 mg by mouth daily.  . fluticasone (FLONASE) 50 MCG/ACT nasal spray Place 1 spray into both nostrils as needed.   . fluticasone furoate-vilanterol (BREO ELLIPTA) 100-25 MCG/INH AEPB Inhale 1 puff into the lungs daily.  Marland Kitchen lovastatin (MEVACOR) 40 MG tablet Take 40 mg by mouth daily.   Marland Kitchen lubiprostone (AMITIZA) 8 MCG capsule Take 1 capsule (8 mcg total) by mouth 2 (two) times daily with a meal.  . pantoprazole (PROTONIX) 40 MG tablet Take 1 tablet (40 mg total) by mouth  daily.  . sertraline (ZOLOFT) 100 MG tablet Take 100 mg by mouth daily.    Marland Kitchen tiotropium (SPIRIVA) 18 MCG inhalation capsule Place 18 mcg into inhaler and inhale daily.    . traMADol (ULTRAM) 50 MG tablet Take 1 tablet (50 mg total) by mouth every 6 (six) hours as needed for moderate pain.  . valsartan-hydrochlorothiazide (DIOVAN-HCT) 320-12.5 MG per tablet Take 1 tablet by mouth daily.    . Vitamin D, Ergocalciferol, (DRISDOL) 1.25 MG (50000 UT) CAPS capsule Take 50,000 Units by mouth every 7 (seven) days.  . [DISCONTINUED] traMADol (ULTRAM) 50 MG tablet Take 50 mg by mouth every 6 (six) hours as needed for moderate pain.  . furosemide (LASIX) 20 MG tablet Take 1 tablet (20 mg total) by mouth 3 (three) times a week. Take it on Mon, Wed and Fri  . [DISCONTINUED] alum & mag hydroxide-simeth (MAALOX/MYLANTA) 200-200-20 MG/5ML suspension Take 30 mLs by mouth as needed for indigestion or heartburn.   No facility-administered encounter medications on file as of 07/23/2019.     Review of Systems:  Review of Systems  Constitutional: Positive for unexpected weight change.  HENT: Negative.   Respiratory: Negative.   Cardiovascular: Positive for leg swelling.  Gastrointestinal: Positive for abdominal distention.  Genitourinary: Negative.   Musculoskeletal: Positive for back pain.  Neurological: Negative.   Psychiatric/Behavioral: Positive for dysphoric mood. The patient is nervous/anxious.   All other systems reviewed and are negative.   Health Maintenance  Topic Date Due  . TETANUS/TDAP  04/08/1951  . DEXA SCAN  04/09/1999  . PNA vac Low Risk Adult (1 of 2 - PCV13) 04/09/1999  . INFLUENZA VACCINE  Completed    Physical Exam: Vitals:   07/23/19 1515  BP: (!) 108/56  Pulse: 91  Temp: 98.1 F (36.7 C)  SpO2: 92%  Weight: 135 lb 3.2 oz (61.3 kg)  Height: 5\' 2"  (1.575 m)   Body mass index is 24.73 kg/m. Physical Exam Vitals signs reviewed.  HENT:     Head: Normocephalic.      Nose: Nose normal.     Mouth/Throat:     Mouth: Mucous membranes are moist.     Pharynx: Oropharynx is clear.  Eyes:     Pupils: Pupils are equal, round, and reactive to light.  Neck:     Musculoskeletal: Neck supple.  Cardiovascular:     Rate and Rhythm: Normal rate.     Heart sounds: Murmur present.  Pulmonary:     Effort: Pulmonary effort is normal.     Breath sounds: Normal breath sounds.  Abdominal:     General: Abdomen is flat. Bowel sounds are normal.     Palpations: Abdomen is soft.  Musculoskeletal:        General: Swelling present.  Skin:    General: Skin is warm.  Neurological:     General: No focal deficit present.     Mental  Status: She is alert and oriented to person, place, and time.  Psychiatric:        Mood and Affect: Mood normal.        Thought Content: Thought content normal.        Judgment: Judgment normal.     Labs reviewed: Basic Metabolic Panel: Recent Labs    09/04/18 2058 09/05/18 0240 09/06/18 0300  09/08/18 0532 09/09/18 0611 05/08/19 0800  NA  --  120* 122*   < > 129* 132* 136  K  --  3.3* 3.9   < > 3.4* 4.0 3.7  CL  --  88* 93*   < > 96* 100 98  CO2  --  22 20*   < > 24 24 29   GLUCOSE  --  92 111*   < > 104* 96 101*  BUN  --  24* 19   < > 8 8 25   CREATININE  --  0.63 0.65   < > 0.50 0.47 0.99*  CALCIUM  --  7.8* 7.8*   < > 8.0* 8.2* 9.0  MG  --  1.8 2.1  --   --  2.1  --   PHOS 3.3 3.0  --   --   --   --   --   TSH  --  0.459  --   --   --   --  1.11   < > = values in this interval not displayed.   Liver Function Tests: Recent Labs    09/04/18 1655 09/05/18 0240 05/08/19 0800  AST 25 19 18   ALT 27 22 11   ALKPHOS 64 54  --   BILITOT 1.2 0.9 0.5  PROT 6.8 5.6* 6.3  ALBUMIN 3.7 3.1*  --    Recent Labs    09/04/18 1655  LIPASE 37   No results for input(s): AMMONIA in the last 8760 hours. CBC: Recent Labs    09/06/18 0300 09/07/18 0323 09/08/18 0532 05/08/19 0800  WBC 11.3* 6.7 6.5 6.4  NEUTROABS 8.6* 4.5  --   4,179  HGB 11.6* 10.2* 10.6* 11.0*  HCT 34.2* 30.1* 31.8* 32.4*  MCV 85.7 88.0 87.6 87.6  PLT 358 260 299 302   Lipid Panel: Recent Labs    05/08/19 0800  CHOL 175  HDL 65  LDLCALC 91  TRIG 101  CHOLHDL 2.7   No results found for: HGBA1C  Procedures since last visit: No results found.  Assessment/Plan Bilateral leg edema Started on Lasix 3/week Repeat BMP in 2 weeks due to her h/o Hyponatremia Recent Echo showed EF of 55% with AS and AR Hyponatremia Will be careful with her Lasix Recent Sodium was 136  Change in bowel habit Follows with GI  Chronic bilateral low back pain without sciatica Continue on Tramadol  Essential hypertension Controlled on Norvasc and Valsartan with HCTZ Will probably need to discontinue Norvasc and  HCTZ if she continues on Lasix as her BP seems low Depression, recurrent (HCC) Will not change Zoloft dose yet due to starting on Lasix Will follow in few weeks  Osteoporosis, On Reclast Will get Records from her PCP COPD Stable on Inhalers Breo and Spiriva  Labs/tests ordered:  * No order type specified * Next appt:  Visit d

## 2019-07-29 DIAGNOSIS — I351 Nonrheumatic aortic (valve) insufficiency: Secondary | ICD-10-CM

## 2019-07-29 HISTORY — DX: Nonrheumatic aortic (valve) insufficiency: I35.1

## 2019-08-04 ENCOUNTER — Other Ambulatory Visit: Payer: Self-pay

## 2019-08-04 ENCOUNTER — Ambulatory Visit: Payer: Medicare Other | Admitting: Cardiology

## 2019-08-04 ENCOUNTER — Encounter: Payer: Self-pay | Admitting: Cardiology

## 2019-08-04 VITALS — BP 127/64 | HR 98 | Ht 62.5 in | Wt 133.0 lb

## 2019-08-04 DIAGNOSIS — I352 Nonrheumatic aortic (valve) stenosis with insufficiency: Secondary | ICD-10-CM

## 2019-08-04 DIAGNOSIS — I493 Ventricular premature depolarization: Secondary | ICD-10-CM

## 2019-08-04 DIAGNOSIS — I350 Nonrheumatic aortic (valve) stenosis: Secondary | ICD-10-CM

## 2019-08-04 DIAGNOSIS — I10 Essential (primary) hypertension: Secondary | ICD-10-CM | POA: Diagnosis not present

## 2019-08-04 NOTE — Progress Notes (Signed)
PCP: Robin Gammon, MD Former Cardiologist: Dr. Georga Robin [name]  Clinic Note: Chief Complaint  Patient presents with  . Follow-up    Test results test results  . Cardiac Valve Problem    Had known moderate aortic stenosis, now with moderate to severe aortic insufficiency.    HPI:    Robin Robin [name] is a 85 y.o. female who was initially into establish new Cardiologist back in October at the request of Robin Gammon, MD.  She now presents to discuss results of her follow-up echocardiogram.    She had been a former patient of Dr. Donnie [name], last seen in April 2019.  Last echocardiogram had been in 2018 for moderate aortic stenosis.  She does have some exertional dyspnea secondary to COPD.  She has spinal stenosis with significant limitations due to back pain and poor balance issues.    Robin Robin [name] was seen on October 14 for initial evaluation with history of moderate aortic stenosis.  Unfortunately, she was not able to partake in lots of the opportunities available at [facility] retirement home, mostly because of the COVID-19 restrictions but also because her husband Robin Robin [name]) is now in the dementia phase of longstanding Parkinson's.   --> At that time she denied any symptoms of chest tightness or pressure.  She said that she may get short of breath more because her back and arthritis pains would bother her as opposed to dyspnea.  No PND, orthopnea or edema.  Little bit of decreased "get up and go".  Rare palpitations.  2D Echo (TTE) ordered.  Recent Hospitalizations: n/a  Reviewed  CV studies:   The following studies were reviewed today: (if available, images/films reviewed: From Epic Chart or Care Everywhere) . TTE July 14, 2019: EF 60-65%. Mild basal septal LVH, GR 2 DD. Mod LA dilation.- High LVEDP. Severe AI, Mod AS-peak gradient 43 mmHg, mean 28 mmHg..  Interval History:   Robin Robin [name] presents here today to discuss the results of her echocardiogram. She is  accompanied by her stepson (Robin Robin [name]) who is here to help with information gathering. Robin [name] actually is doing fairly well.  She is here with her walker, walking in relatively stable actually.  She really does indicate that she has no PND, orthopnea but has had a little bit of edema.  She discussed already on Lasix that she takes 3 days a week.  She does not notice any significant exertional dyspnea, indicating that she is basically limited because of her back pain and unsteady gait. She has not had any chest tightness or pressure with rest or exertion.  She denies any rapid heartbeats or palpitations.  No syncope/near syncope or TIA/amaurosis fugax.   Cardiovascular review of symptoms: positive for - palpitations and Exertional dyspnea is mostly related to deconditioning and poor balance.  Limited by back pain. negative for - chest pain, edema, murmur, orthopnea, paroxysmal nocturnal dyspnea, rapid heart rate, shortness of breath or TIA/amaurosis fugax, syncope/near syncope, claudication Very slow, unsteady gait.  Significant decreased mobility.  The patient DOES NOT have symptoms concerning for COVID-19 infection (fever, chills, cough, or new shortness of breath).  The patient is practicing social distancing. ++ Masking.    She is accompanied by her husband's Robin [name] to assist with recalling information.  REVIEWED OF SYSTEMS   ROS: A comprehensive was performed. Review of Systems  Constitutional: Negative for chills, fever, malaise/fatigue (Just now about a get up and go.  Does not do much.) and weight loss.  HENT: Positive for hearing loss. Negative for congestion and nosebleeds.   Respiratory: Negative for cough, shortness of breath and wheezing.   Cardiovascular: Positive for leg swelling (Mild lower extremity swelling.). Negative for orthopnea.  Gastrointestinal: Positive for constipation and diarrhea.       Intermittent constipation and diarrhea/loose stools  Genitourinary: Negative for  dysuria, hematuria and urgency.  Musculoskeletal: Positive for back pain (Diffuse) and joint pain (Mostly hips). Negative for falls (None recently).  Neurological: Positive for dizziness (If she stands up fast) and weakness (She feels like she has bilateral leg weakness). Negative for tremors, focal weakness and loss of consciousness.       Poor balance  Psychiatric/Behavioral: Positive for depression. Negative for memory loss. The patient is not nervous/anxious and does not have insomnia.        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  Chasing him around the apartment with his dementia and agitation.  All other systems reviewed and are negative.   I have reviewed and (if needed) personally updated the patient's problem list, medications, allergies, past medical and surgical history, social and family history.   PAST MEDICAL HISTORY   Past Medical History:  Diagnosis Date  . Arthritis   . COPD (chronic obstructive pulmonary disease) (HCC)   . Depression   . Diverticulosis   . GERD (gastroesophageal reflux disease)   . Heart murmur   . Hyperlipemia   . Hypertension   . Irregular heart beat 2014   PVCs  . Moderate aortic stenosis by prior echocardiogram 06/11/2019   Echo November 2020: EF 60-65%. Mild basal septal LVH, GR 2 DD. Mod LA dilation.- High LVEDP. Severe AI, Mod AS-peak gradient 43 mmHg, mean 28 mmHg..  . Osteoporosis      PAST SURGICAL HISTORY   Past Surgical History:  Procedure Laterality Date  . ABDOMINAL HYSTERECTOMY     both appy and hyst done together  . APPENDECTOMY    . CATARACT EXTRACTION    . TRANSTHORACIC ECHOCARDIOGRAM  06/2019   EF 60-65%. Mild basal septal LVH, GR 2 DD. Mod LA dilation.- High LVEDP. Severe AI, Mod AS-peak gradient 43 mmHg, mean 28 mmHg..   . Reviewed Dr. York Spaniel note that indicated echocardiogram most recently done in April 2018 with EF of 60%.  Normal wall motion.  Moderate LAE.  Moderate aortic stenosis and regurgitation  (mean gradient 22.9 mmHg.  Peak gradient 40.2 mmHg).  GRII MR with mildly restricted leaflets.  Mild left and moderate TR. Marland Kitchen Holter monitor October 2014 showed sinus rhythm with PVCs.   MEDICATIONS/ALLERGIES   Current Meds  Medication Sig  . acetaminophen (TYLENOL) 650 MG CR tablet Take 650 mg by mouth 2 (two) times daily. 2 tablets twice daily  . albuterol (PROVENTIL HFA;VENTOLIN HFA) 108 (90 Base) MCG/ACT inhaler Inhale 2 puffs into the lungs every 4 (four) hours as needed for wheezing or shortness of breath.  Marland Kitchen amLODipine (NORVASC) 2.5 MG tablet Take 2.5 mg by mouth daily.  Marland Kitchen aspirin 81 MG tablet Take 81 mg by mouth daily.  . fluticasone (FLONASE) 50 MCG/ACT nasal spray Place 1 spray into both nostrils as needed.   . fluticasone furoate-vilanterol (BREO ELLIPTA) 100-25 MCG/INH AEPB Inhale 1 puff into the lungs daily.  . furosemide (LASIX) 20 MG tablet Take 1 tablet (20 mg total) by mouth 3 (three) times a week. Take it on Mon, Wed and Fri  . lovastatin (MEVACOR) 40 MG tablet Take 40 mg by mouth daily.   Marland Kitchen  lubiprostone (AMITIZA) 8 MCG capsule Take 1 capsule (8 mcg total) by mouth 2 (two) times daily with a meal.  . pantoprazole (PROTONIX) 40 MG tablet Take 1 tablet (40 mg total) by mouth daily.  . sertraline (ZOLOFT) 100 MG tablet Take 100 mg by mouth daily.    Marland Kitchen tiotropium (SPIRIVA) 18 MCG inhalation capsule Place 18 mcg into inhaler and inhale daily.    . traMADol (ULTRAM) 50 MG tablet Take 1 tablet (50 mg total) by mouth every 6 (six) hours as needed for moderate pain.  . valsartan-hydrochlorothiazide (DIOVAN-HCT) 320-12.5 MG per tablet Take 1 tablet by mouth daily.    . Vitamin D, Ergocalciferol, (DRISDOL) 1.25 MG (50000 UT) CAPS capsule Take 50,000 Units by mouth every 7 (seven) days.    Allergies  Allergen Reactions  . Fosamax [Alendronate Sodium]     Poor healing to mouth wound  . Augmentin [Amoxicillin-Pot Clavulanate] Nausea Only    DID THE REACTION INVOLVE: Swelling of the  face/tongue/throat, SOB, or low BP? No Sudden or severe rash/hives, skin peeling, or the inside of the mouth or nose? No Did it require medical treatment? No When did it last happen? If all above answers are "NO", may proceed with cephalosporin use.   Marland Kitchen Hydrocodone Nausea And Vomiting  . Mobic [Meloxicam] Rash  . Sulfonamide Derivatives Rash     SOCIAL HISTORY/FAMILY HISTORY   Social History   Tobacco Use  . Smoking status: Former Smoker    Packs/day: 1.00    Years: 30.00    Pack years: 30.00    Types: Cigarettes    Quit date: 08/28/2008    Years since quitting: 10.9  . Smokeless tobacco: Never Used  Substance Use Topics  . Alcohol use: Yes    Alcohol/week: 14.0 standard drinks    Types: 14 Standard drinks or equivalent per week    Comment: 1-2 glass of wine or mixed drink daily  . Drug use: No   Social History   Social History Narrative   Currently remarried.  New husband has significant cardiac history.  (Followed by Dr. Ellyn Hack)   She is a retired Pharmacist, hospital.      She and her new husband Obie Dredge") recently moved to [facility] just prior to the outbreak of the pandemic.         Unfortunately, they are not able to partake of all the activities that are there.  She had been doing PT with plans of then follow that up there at friend's home, but this got canceled due to COVID-19.  She does have a pretty unsteady gait and has to either use a cane for short distances or uses a walker for longer distance.       This is really because of her bad back and poor balance.  As result of this, she really is not all that active, and is very upset about not being on to do her rehab.    family history includes Cancer in her sister and sister; Gastric cancer in her mother; Hypertension in her mother; Lung cancer in her father.   OBJCTIVE -PE, EKG, labs   Wt Readings from Last 3 Encounters:  08/04/19 133 lb (60.3 kg)  07/23/19 135 lb 3.2 oz (61.3 kg)  06/11/19 132 lb (59.9  kg)    Physical Exam: BP 127/64   Pulse 98   Ht 5' 2.5" (1.588 m)   Wt 133 lb (60.3 kg)   SpO2 98%   BMI 23.94 kg/m  Physical  Exam  Constitutional: She is oriented to person, place, and time. She appears well-developed and well-nourished. No distress.  Relatively healthy-appearing elderly woman.  Well-groomed  HENT:  Head: Normocephalic and atraumatic.  Eyes: Pupils are equal, round, and reactive to light. EOM are normal.  Neck: Normal range of motion. Neck supple. Decreased carotid pulses (She does have a somewhat jackhammer pulse ->  somewhat delayed with brisk downstroke.) present. No hepatojugular reflux and no JVD present. Carotid bruit is present (Bilateral bruit versus radiated aortic murmur).  Cardiovascular: Normal rate, regular rhythm, S1 normal, S2 normal and intact distal pulses.  No extrasystoles are present. PMI is not displaced. Exam reveals distant heart sounds. Exam reveals no gallop and no friction rub.  Murmur heard. High-pitched harsh crescendo-decrescendo mid to late systolic murmur is present with a grade of 3/6 at the upper right sternal border radiating to the neck.  Blowing early diastolic murmur is present with a grade of 1/6 at the upper right sternal border. Pulmonary/Chest: Effort normal and breath sounds normal. No respiratory distress. She has no wheezes. She has no rales.  Musculoskeletal: Normal range of motion.        General: Edema (Trivial ankle) present.  Neurological: She is alert and oriented to person, place, and time. No cranial nerve deficit.  Hard of hearing  Psychiatric: She has a normal mood and affect. Her behavior is normal. Judgment and thought content normal.  Vitals reviewed.   Adult ECG Report  Rate: 84 ;  Rhythm: normal sinus rhythm and 1 degree AVB.  Cannot exclude anterior MI age undetermined.;   Narrative Interpretation: No prior EKG to review  Recent Labs:   Lab Results  Component Value Date   CREATININE 0.99 (H) 05/08/2019    BUN 25 05/08/2019   NA 136 05/08/2019   K 3.7 05/08/2019   CL 98 05/08/2019   CO2 29 05/08/2019   Lab Results  Component Value Date   CHOL 175 05/08/2019   HDL 65 05/08/2019   LDLCALC 91 05/08/2019   TRIG 101 05/08/2019   CHOLHDL 2.7 05/08/2019   Lab Results  Component Value Date   WBC 6.4 05/08/2019   HGB 11.0 (L) 05/08/2019   HCT 32.4 (L) 05/08/2019   MCV 87.6 05/08/2019   PLT 302 05/08/2019    ASSESSMENT/PLAN    Problem List Items Addressed This Visit    Moderate aortic stenosis by prior echocardiogram (Chronic)    Follow-up echo showed moderate AS, more concerning was AI.  We reviewed concerning symptoms of both ASA and AI.  Based on progression of AI, will recheck echo in 3 months.      Relevant Orders   ECHOCARDIOGRAM COMPLETE   Nonrheumatic aortic insufficiency with aortic stenosis - Primary (Chronic)    Now has evidence of severe AI on echo.  Soft audible murmur on exam, but is often difficult to assess. Currently EF is stable and LV size is normal.  With EF greater than 60-65%, will continue to monitor both for correction of aortic stenosis as well as regurgitation and change in LV size/function which would push [place] toward treatment.   She is currently asymptomatic, so we discussed concerning symptoms.  She does have some exertional dyspnea but probably more because of deconditioning as opposed to true symptomatic valve disease. We did discuss the pathophysiology of the combination of aortic stenosis and regurgitation.  We talked about natural progression.  Plan: Recheck follow-up in 6 months.       Essential hypertension (Chronic)  Stable blood pressure on ARB-HCTZ and calcium channel blocker.  Adequate pressure control for now.  If pressures were to go up, would consider beta-blocker      PVC's (premature ventricular contractions) (Chronic)    Relatively asymptomatic.  She does not seem to be noticing palpitations that much.  Not on beta-blocker.   Holding off for now (would be next choice for treatment for blood pressure)          COVID-19 Education: The signs and symptoms of COVID-19 were discussed with the patient and how to seek care for testing (follow up with PCP or arrange E-visit).   The importance of social distancing was discussed today.  I spent a total of 25 minutes with the patient and chart review. >  50% of the time was spent in direct patient consultation.  Additional time spent with chart review (studies, outside notes, etc): 10 Total Time: 35min   Current medicines are reviewed at length with the patient today.  (+/- concerns) none   Patient Instructions / Medication Changes & Studies & Tests Ordered   Patient Instructions  Medication Instructions:  No changes *If you need a refill on your cardiac medications before your next appointment, please call your pharmacy*  Lab Work: Not needed  Testing/Procedures: Will schedule at [facility]- May 2021 Your physician has requested that you have an echocardiogram. Echocardiography is a painless test that uses sound waves to create images of your heart. It provides your doctor with information about the size and shape of your heart and how well your heart's chambers and valves are working. This procedure takes approximately one hour. There are no restrictions for this procedure.   Follow-Up: At [facility], you and your health needs are our priority.  As part of our continuing mission to provide you with exceptional heart care, we have created designated Provider Care Teams.  These Care Teams include your primary Cardiologist (physician) and Advanced Practice Providers (APPs -  Physician Assistants and Nurse Practitioners) who all work together to provide you with the care you need, when you need it.  Your next appointment:   6 month(s)- June 2021  The format for your next appointment:   In Person  Provider:   Bryan Lemmaavid Harding, MD   Other Instructions     Studies Ordered:   Orders Placed This Encounter  Procedures  . ECHOCARDIOGRAM COMPLETE   C Asymptomatic severe AR Calcific aortic valve disease Bicuspid valve (or other congenital abnormality) Dilated aortic sinuses or ascending aorta Rheumatic valve changes IE with abnormal leaflet closure or perforation Severe AR: Jet width ?65% of LVOT; Vena contracta >0.6 cm; Holodiastolic flow reversal in the proximal abdominal aorta RVol ?60 mL/beat; RF ?50%; ERO ?0.3 cm2; Angiography grade 3+ to 4+; In addition, diagnosis of chronic severe AR requires evidence of LV dilation C1: Normal LVEF (?50%) and LVESD ?50 mm C2: Abnormal LV systolic function with depressed LVEF (<50%), LVESD >50 mm, or indexed LVESD >25 mm/m2 None; exercise testing is reasonable to confirm symptom status      Bryan Lemmaavid Harding, M.D., M.S. Interventional Cardiologist   Pager # [phone] Phone # [phone] [address] [place], [place] [address]   Thank you for choosing [facility] at [facility]!!

## 2019-08-04 NOTE — Patient Instructions (Signed)
Medication Instructions:  No changes *If you need a refill on your cardiac medications before your next appointment, please call your pharmacy*  Lab Work: Not needed  Testing/Procedures: Will schedule at [facility] [address]- May 2021 Your physician has requested that you have an echocardiogram. Echocardiography is a painless test that uses sound waves to create images of your heart. It provides your doctor with information about the size and shape of your heart and how well your heart's chambers and valves are working. This procedure takes approximately one hour. There are no restrictions for this procedure.   Follow-Up: At [facility], you and your health needs are our priority.  As part of our continuing mission to provide you with exceptional heart care, we have created designated Provider Care Teams.  These Care Teams include your primary Cardiologist (physician) and Advanced Practice Providers (APPs -  Physician Assistants and Nurse Practitioners) who all work together to provide you with the care you need, when you need it.  Your next appointment:   6 month(s)- June 2021  The format for your next appointment:   In Person  Provider:   Glenetta Hew, MD  Other Instructions

## 2019-08-06 ENCOUNTER — Encounter: Payer: Self-pay | Admitting: Cardiology

## 2019-08-06 DIAGNOSIS — I352 Nonrheumatic aortic (valve) stenosis with insufficiency: Secondary | ICD-10-CM | POA: Insufficient documentation

## 2019-08-06 NOTE — Assessment & Plan Note (Signed)
Follow-up echo showed moderate AS, more concerning was AI.  We reviewed concerning symptoms of both ASA and AI.  Based on progression of AI, will recheck echo in 3 months.

## 2019-08-06 NOTE — Assessment & Plan Note (Signed)
Now has evidence of severe AI on echo.  Soft audible murmur on exam, but is often difficult to assess. Currently EF is stable and LV size is normal.  With EF greater than 60-65%, will continue to monitor both for correction of aortic stenosis as well as regurgitation and change in LV size/function which would push [place] toward treatment.   She is currently asymptomatic, so we discussed concerning symptoms.  She does have some exertional dyspnea but probably more because of deconditioning as opposed to true symptomatic valve disease. We did discuss the pathophysiology of the combination of aortic stenosis and regurgitation.  We talked about natural progression.  Plan: Recheck follow-up in 6 months.

## 2019-08-06 NOTE — Assessment & Plan Note (Signed)
Relatively asymptomatic.  She does not seem to be noticing palpitations that much.  Not on beta-blocker.  Holding off for now (would be next choice for treatment for blood pressure)

## 2019-08-06 NOTE — Assessment & Plan Note (Signed)
Stable blood pressure on ARB-HCTZ and calcium channel blocker.  Adequate pressure control for now.  If pressures were to go up, would consider beta-blocker

## 2019-08-07 ENCOUNTER — Other Ambulatory Visit: Payer: Self-pay

## 2019-08-07 DIAGNOSIS — E871 Hypo-osmolality and hyponatremia: Secondary | ICD-10-CM

## 2019-08-07 LAB — BASIC METABOLIC PANEL
BUN/Creatinine Ratio: 24 (calc) — ABNORMAL HIGH (ref 6–22)
BUN: 21 mg/dL (ref 7–25)
CO2: 28 mmol/L (ref 20–32)
Calcium: 9.4 mg/dL (ref 8.6–10.4)
Chloride: 97 mmol/L — ABNORMAL LOW (ref 98–110)
Creat: 0.89 mg/dL — ABNORMAL HIGH (ref 0.60–0.88)
Glucose, Bld: 103 mg/dL — ABNORMAL HIGH (ref 65–99)
Potassium: 3.1 mmol/L — ABNORMAL LOW (ref 3.5–5.3)
Sodium: 140 mmol/L (ref 135–146)

## 2019-08-11 ENCOUNTER — Telehealth: Payer: Self-pay

## 2019-08-11 NOTE — Telephone Encounter (Signed)
Sure she can change her Appointment

## 2019-08-11 NOTE — Telephone Encounter (Signed)
Patient has a schedule conflict and cannot keep her appointment on 08/13/19.  She wants to know if she can wait until 12/30 to follow up.

## 2019-08-13 ENCOUNTER — Encounter: Payer: Self-pay | Admitting: Internal Medicine

## 2019-08-27 ENCOUNTER — Encounter: Payer: Self-pay | Admitting: Internal Medicine

## 2019-08-27 ENCOUNTER — Non-Acute Institutional Stay: Payer: Medicare Other | Admitting: Internal Medicine

## 2019-08-27 ENCOUNTER — Other Ambulatory Visit: Payer: Self-pay

## 2019-08-27 VITALS — BP 138/64 | HR 82 | Temp 97.9°F | Ht 62.5 in | Wt 133.2 lb

## 2019-08-27 DIAGNOSIS — R194 Change in bowel habit: Secondary | ICD-10-CM

## 2019-08-27 DIAGNOSIS — R600 Localized edema: Secondary | ICD-10-CM

## 2019-08-27 DIAGNOSIS — M810 Age-related osteoporosis without current pathological fracture: Secondary | ICD-10-CM

## 2019-08-27 DIAGNOSIS — E876 Hypokalemia: Secondary | ICD-10-CM | POA: Diagnosis not present

## 2019-08-27 DIAGNOSIS — G8929 Other chronic pain: Secondary | ICD-10-CM

## 2019-08-27 DIAGNOSIS — E871 Hypo-osmolality and hyponatremia: Secondary | ICD-10-CM

## 2019-08-27 DIAGNOSIS — M545 Low back pain: Secondary | ICD-10-CM

## 2019-08-27 DIAGNOSIS — I10 Essential (primary) hypertension: Secondary | ICD-10-CM

## 2019-08-27 DIAGNOSIS — F339 Major depressive disorder, recurrent, unspecified: Secondary | ICD-10-CM

## 2019-08-27 MED ORDER — TRAMADOL HCL 50 MG PO TABS: 50.0000 mg | ORAL_TABLET | Freq: Four times a day (QID) | ORAL | 0 refills | Status: DC | PRN

## 2019-08-27 MED ORDER — POTASSIUM CHLORIDE CRYS ER 20 MEQ PO TBCR: 20.0000 meq | EXTENDED_RELEASE_TABLET | ORAL | 3 refills | Status: DC

## 2019-08-27 NOTE — Progress Notes (Signed)
[facility] Service:  Clinic (12)  Provider:   Code Status:  Goals of Care:  Advanced Directives 09/04/2018  Does Patient Have a Medical Advance Directive? Yes  Type of Estate agent of [place];Living will  Does patient want to make changes to medical advance directive? No - Patient declined  Copy of Healthcare Power of Attorney in Chart? No - copy requested     Chief Complaint  Patient presents with  . Medical Management of Chronic Issues    3 week follow   . Health Maintenance    TDAP, Dexa, PCV 13    HPI: Patient is a 85 y.o. female seen today for an acute visit for Follow up of her LE edema.  Bilateral Edema Patient started on Lasix 20 mg 3 times a week Her edema is much better though still has some ankle Repeat BMP did not show any hyponatremia but she did have hypokalemia History of AR with AS  was seen by cardiology They want to follow her with repeat echo in 6 months Depression Her husband who has Parkinson's and is going to be moved to healthcare Patient is very upset about it She continues to be stable on Zoloft Hyponatremia Was admitted in 1/20 with Sodium of 116. Responded to hydration was thought to be due to Dehydrationand UTI Repeat Sodium has been stable Hypertension On valsartan/HCTZ and Norvasc  COPD On Inhalers Chronic Back Pain on Tramadol PRn Takes 1 a day Does not want any nore Epidural shots Unsteady gait Has not fallen recently. Walks very slowly with [employer]. Still drives Osteoporosis On Reclast since 2013 I do not have Recent Imaging for Bone scan Alternate Diarrhea and Constipation ? IBS and Lactose Intolerant  Last Colonoscopy was in 2001 Was seen by Dr Rosalia Hammers  Past Medical History:  Diagnosis Date  . Arthritis   . COPD (chronic obstructive pulmonary disease) (HCC)   . Depression   . Diverticulosis   . GERD (gastroesophageal reflux disease)   . Heart murmur   .  Hyperlipemia   . Hypertension   . Irregular heart beat 2014   PVCs  . Moderate aortic stenosis by prior echocardiogram 06/11/2019   Echo November 2020: EF 60-65%. Mild basal septal LVH, GR 2 DD. Mod LA dilation.- High LVEDP. Severe AI, Mod AS-peak gradient 43 mmHg, mean 28 mmHg..  . Osteoporosis     Past Surgical History:  Procedure Laterality Date  . ABDOMINAL HYSTERECTOMY     both appy and hyst done together  . APPENDECTOMY    . CATARACT EXTRACTION    . TRANSTHORACIC ECHOCARDIOGRAM  06/2019   EF 60-65%. Mild basal septal LVH, GR 2 DD. Mod LA dilation.- High LVEDP. Severe AI, Mod AS-peak gradient 43 mmHg, mean 28 mmHg..    Allergies  Allergen Reactions  . Fosamax [Alendronate Sodium]     Poor healing to mouth wound  . Augmentin [Amoxicillin-Pot Clavulanate] Nausea Only    DID THE REACTION INVOLVE: Swelling of the face/tongue/throat, SOB, or low BP? No Sudden or severe rash/hives, skin peeling, or the inside of the mouth or nose? No Did it require medical treatment? No When did it last happen? If all above answers are "NO", may proceed with cephalosporin use.   Marland Kitchen Hydrocodone Nausea And Vomiting  . Mobic [Meloxicam] Rash  . Sulfonamide Derivatives Rash    Outpatient Encounter Medications as of 08/27/2019  Medication Sig  . acetaminophen (TYLENOL) 650 MG CR tablet Take 650  mg by mouth 2 (two) times daily. 2 tablets twice daily  . albuterol (PROVENTIL HFA;VENTOLIN HFA) 108 (90 Base) MCG/ACT inhaler Inhale 2 puffs into the lungs every 4 (four) hours as needed for wheezing or shortness of breath.  Marland Kitchen. amLODipine (NORVASC) 2.5 MG tablet Take 2.5 mg by mouth daily.  Marland Kitchen. aspirin 81 MG tablet Take 81 mg by mouth daily.  . fluticasone (FLONASE) 50 MCG/ACT nasal spray Place 1 spray into both nostrils as needed.   . fluticasone furoate-vilanterol (BREO ELLIPTA) 100-25 MCG/INH AEPB Inhale 1 puff into the lungs daily.  . furosemide (LASIX) 20 MG tablet Take 1 tablet (20 mg total) by  mouth 3 (three) times a week. Take it on Mon, Wed and Fri  . lovastatin (MEVACOR) 40 MG tablet Take 40 mg by mouth daily.   Marland Kitchen. lubiprostone (AMITIZA) 8 MCG capsule Take 1 capsule (8 mcg total) by mouth 2 (two) times daily with a meal.  . pantoprazole (PROTONIX) 40 MG tablet Take 1 tablet (40 mg total) by mouth daily.  . sertraline (ZOLOFT) 100 MG tablet Take 100 mg by mouth daily.    Marland Kitchen. tiotropium (SPIRIVA) 18 MCG inhalation capsule Place 18 mcg into inhaler and inhale daily.    . traMADol (ULTRAM) 50 MG tablet Take 1 tablet (50 mg total) by mouth every 6 (six) hours as needed for moderate pain.  . valsartan-hydrochlorothiazide (DIOVAN-HCT) 320-12.5 MG per tablet Take 1 tablet by mouth daily.    . Vitamin D, Ergocalciferol, (DRISDOL) 1.25 MG (50000 UT) CAPS capsule Take 50,000 Units by mouth every 7 (seven) days.   No facility-administered encounter medications on file as of 08/27/2019.    Review of Systems:  Review of Systems  Constitutional: Negative.   HENT: Negative.   Respiratory: Negative.   Cardiovascular: Positive for leg swelling.  Gastrointestinal: Positive for constipation.  Genitourinary: Negative.   Musculoskeletal: Positive for back pain.  Skin: Negative.   Neurological: Positive for weakness.  Psychiatric/Behavioral: Positive for dysphoric mood.    Health Maintenance  Topic Date Due  . TETANUS/TDAP  04/08/1951  . DEXA SCAN  04/09/1999  . PNA vac Low Risk Adult (1 of 2 - PCV13) 04/09/1999  . INFLUENZA VACCINE  Completed    Physical Exam: Vitals:   08/27/19 1411  BP: 138/64  Pulse: 82  Temp: 97.9 F (36.6 C)  SpO2: 99%  Weight: 133 lb 3.2 oz (60.4 kg)  Height: 5' 2.5" (1.588 m)   Body mass index is 23.97 kg/m. Physical Exam  Constitutional: Oriented to person, place, and time. Well-developed and well-nourished.  HENT:  Head: Normocephalic. Mild Wax in Ears Can Follow with her ENT latter Mouth/Throat: Oropharynx is clear and moist.  Eyes: Pupils are  equal, round, and reactive to light.  Neck: Neck supple.  Cardiovascular: Normal rate and normal heart sounds.  Murmur Present Pulmonary/Chest: Effort normal and breath sounds normal. No respiratory distress. No wheezes. She has no rales.  Abdominal: Soft. Bowel sounds are normal. No distension. There is no tenderness. There is no rebound.  Musculoskeletal: Mild edema Bilateral Lymphadenopathy: none Neurological: Alert and oriented to person, place, and time. Walks with Walker Skin: Skin is warm and dry.  Psychiatric: Normal mood and affect. Behavior is normal. Thought content normal.    Labs reviewed: Basic Metabolic Panel: Recent Labs    09/04/18 1655 09/04/18 2058 09/05/18 0240 09/06/18 0300 09/09/18 0611 05/08/19 0800 08/06/19 1443  NA  --   --  120* 122* 132* 136 140  K  --   --  3.3* 3.9 4.0 3.7 3.1*  CL  --   --  88* 93* 100 98 97*  CO2  --   --  22 20* 24 29 28   GLUCOSE  --   --  92 111* 96 101* 103*  BUN  --   --  24* 19 8 25 21   CREATININE  --   --  0.63 0.65 0.47 0.99* 0.89*  CALCIUM  --   --  7.8* 7.8* 8.2* 9.0 9.4  MG   < >  --  1.8 2.1 2.1  --   --   PHOS  --  3.3 3.0  --   --   --   --   TSH  --   --  0.459  --   --  1.11  --    < > = values in this interval not displayed.   Liver Function Tests: Recent Labs    09/04/18 1655 09/05/18 0240 05/08/19 0800  AST 25 19 18   ALT 27 22 11   ALKPHOS 64 54  --   BILITOT 1.2 0.9 0.5  PROT 6.8 5.6* 6.3  ALBUMIN 3.7 3.1*  --    Recent Labs    09/04/18 1655  LIPASE 37   No results for input(s): AMMONIA in the last 8760 hours. CBC: Recent Labs    09/06/18 0300 09/07/18 0323 09/08/18 0532 05/08/19 0800  WBC 11.3* 6.7 6.5 6.4  NEUTROABS 8.6* 4.5  --  4,179  HGB 11.6* 10.2* 10.6* 11.0*  HCT 34.2* 30.1* 31.8* 32.4*  MCV 85.7 88.0 87.6 87.6  PLT 358 260 299 302   Lipid Panel: Recent Labs    05/08/19 0800  CHOL 175  HDL 65  LDLCALC 91  TRIG 101  CHOLHDL 2.7   No results found for:  HGBA1C  Procedures since last visit: No results found.  Assessment/Plan Bilateral leg edema Continue 3 times a week Will try TED hoses  Hypokalemia Started on potassium 20 mEq 3 times a week to take with Lasix Repeat BMP in 4 weeks  Hyponatremia Sodium is stable on diuretic Change in bowel habit Follows with GI  Chronic bilateral low back pain without sciatica Tramadol was renewed  Osteoporosis Is on a Reclast Since 2013 Will d/w her about DEXA   Essential hypertension Controlled on Norvasc and lovastatin  Depression, recurrent ([place]) Patient is having some issues due to her husband moving to high level of care At this time we will continue the same dose of Zoloft Her sodium is stable COPD Stable on inhalers BUN Spiriva      Labs/tests ordered:  * No order type specified * Next appt:  Visit date not found

## 2019-09-10 ENCOUNTER — Telehealth: Payer: Self-pay

## 2019-09-10 NOTE — Telephone Encounter (Signed)
She can take Benadryl 12.5 mg tonight and tomorrow. Can take Tylenol also to help with discomfort. Also Apply OTC hydrocortisone on the rash to help her itching

## 2019-09-10 NOTE — Telephone Encounter (Signed)
The patient called and stated she had about a golfball-sized itchy rash around the injection site of her COVID vaccine.  She stated she received vaccine on 09/01/19. She is asking if she should be concerned or do anything for this.

## 2019-09-11 NOTE — Telephone Encounter (Signed)
Called patient with recommendations.  

## 2019-09-21 ENCOUNTER — Other Ambulatory Visit: Payer: Self-pay | Admitting: Gastroenterology

## 2019-09-22 ENCOUNTER — Other Ambulatory Visit: Payer: Self-pay

## 2019-09-22 DIAGNOSIS — E876 Hypokalemia: Secondary | ICD-10-CM

## 2019-09-22 LAB — BASIC METABOLIC PANEL WITH GFR
BUN: 25 mg/dL (ref 7–25)
CO2: 28 mmol/L (ref 20–32)
Calcium: 9.4 mg/dL (ref 8.6–10.4)
Chloride: 100 mmol/L (ref 98–110)
Creat: 0.67 mg/dL (ref 0.60–0.88)
GFR, Est African American: 93 mL/min/{1.73_m2} (ref >=60)
GFR, Est Non African American: 80 mL/min/{1.73_m2} (ref >=60)
Glucose, Bld: 94 mg/dL (ref 65–99)
Potassium: 4.1 mmol/L (ref 3.5–5.3)
Sodium: 137 mmol/L (ref 135–146)

## 2019-11-26 ENCOUNTER — Non-Acute Institutional Stay: Payer: Medicare PPO | Admitting: Internal Medicine

## 2019-11-26 ENCOUNTER — Other Ambulatory Visit: Payer: Self-pay

## 2019-11-26 ENCOUNTER — Encounter: Payer: Self-pay | Admitting: Internal Medicine

## 2019-11-26 VITALS — BP 134/70 | HR 96 | Temp 97.9°F | Ht 62.5 in | Wt 131.2 lb

## 2019-11-26 DIAGNOSIS — I10 Essential (primary) hypertension: Secondary | ICD-10-CM | POA: Diagnosis not present

## 2019-11-26 DIAGNOSIS — I352 Nonrheumatic aortic (valve) stenosis with insufficiency: Secondary | ICD-10-CM | POA: Diagnosis not present

## 2019-11-26 DIAGNOSIS — R600 Localized edema: Secondary | ICD-10-CM

## 2019-11-26 DIAGNOSIS — E871 Hypo-osmolality and hyponatremia: Secondary | ICD-10-CM

## 2019-11-26 DIAGNOSIS — M810 Age-related osteoporosis without current pathological fracture: Secondary | ICD-10-CM | POA: Diagnosis not present

## 2019-11-26 DIAGNOSIS — F339 Major depressive disorder, recurrent, unspecified: Secondary | ICD-10-CM

## 2019-11-26 MED ORDER — ZOSTER VAC RECOMB ADJUVANTED 50 MCG/0.5ML IM SUSR: 0.5000 mL | Freq: Once | INTRAMUSCULAR | 0 refills | Status: AC

## 2019-11-26 NOTE — Progress Notes (Signed)
Location:  [facility]  Cl[facility] (12)  Provider:   Code Status:  Goals of Care:  Advanced Directives 09/04/2018  Does Patient Have a Medical Advance Directive? Yes  Type of Estate agent of [place];Living will  Does patient want to make changes to medical advance directive? No - Patient declined  Copy of Healthcare Power of Attorney in Chart? No - copy requested     Chief Complaint  Patient presents with  . Medical Management of Chronic Issues    3 month follow up. No concerns  . Health Maintenance    Dexa scan, PNA, TDAP    HPI: Patient is a 86 y.o. female seen today for medical management of chronic diseases.   Bilateral Edema Got better on Lasix but not taking it anymore H/O AR with AS Has follow up with Cardiology Depression Taking care of Husband with Parkinson Disease Having some days which are stressful. Has hired Caregivers Hyponatremia Was admitted in 1/20 with Sodium of 116. Responded to hydration was thought to be due to Dehydrationand UTI Repeat Sodium has been stable HTN Doing well with her Meds Alternate Diarrhea and Constipation ? IBS and Lactose Intolerant Was seen by GI Was Prescribed Amitiza Not taking it right now Says her symptoms are controlled  Chronic Back Pain on Tramadol PRn Takes 1 a day Says it does not help Does not want to take Epidural Shots any more Unsteady gait Has not fallen recently. Walks very slowly with [employer]. Still drives Past Medical History:  Diagnosis Date  . Arthritis   . COPD (chronic obstructive pulmonary disease) (HCC)   . Depression   . Diverticulosis   . GERD (gastroesophageal reflux disease)   . Heart murmur   . Hyperlipemia   . Hypertension   . Irregular heart beat 2014   PVCs  . Moderate aortic stenosis by prior echocardiogram 06/11/2019   Echo November 2020: EF 60-65%. Mild basal septal LVH, GR 2 DD. Mod LA dilation.- High LVEDP. Severe AI, Mod AS-peak  gradient 43 mmHg, mean 28 mmHg..  . Osteoporosis     Past Surgical History:  Procedure Laterality Date  . ABDOMINAL HYSTERECTOMY     both appy and hyst done together  . APPENDECTOMY    . CATARACT EXTRACTION    . TRANSTHORACIC ECHOCARDIOGRAM  06/2019   EF 60-65%. Mild basal septal LVH, GR 2 DD. Mod LA dilation.- High LVEDP. Severe AI, Mod AS-peak gradient 43 mmHg, mean 28 mmHg..    Allergies  Allergen Reactions  . Fosamax [Alendronate Sodium]     Poor healing to mouth wound  . Augmentin [Amoxicillin-Pot Clavulanate] Nausea Only    DID THE REACTION INVOLVE: Swelling of the face/tongue/throat, SOB, or low BP? No Sudden or severe rash/hives, skin peeling, or the inside of the mouth or nose? No Did it require medical treatment? No When did it last happen? If all above answers are "NO", may proceed with cephalosporin use.   Marland Kitchen Hydrocodone Nausea And Vomiting  . Mobic [Meloxicam] Rash  . Sulfonamide Derivatives Rash    Outpatient Encounter Medications as of 11/26/2019  Medication Sig  . acetaminophen (TYLENOL) 650 MG CR tablet Take 650 mg by mouth 2 (two) times daily. 2 tablets twice daily  . albuterol (PROVENTIL HFA;VENTOLIN HFA) 108 (90 Base) MCG/ACT inhaler Inhale 2 puffs into the lungs every 4 (four) hours as needed for wheezing or shortness of breath.  . AMITIZA 8 MCG capsule TAKE 1 CAPSULE (8  MCG TOTAL) BY MOUTH 2 (TWO) TIMES DAILY WITH A MEAL.  Marland Kitchen amLODipine (NORVASC) 2.5 MG tablet Take 2.5 mg by mouth daily.  Marland Kitchen aspirin 81 MG tablet Take 81 mg by mouth daily.  . fluticasone (FLONASE) 50 MCG/ACT nasal spray Place 1 spray into both nostrils as needed.   . fluticasone furoate-vilanterol (BREO ELLIPTA) 100-25 MCG/INH AEPB Inhale 1 puff into the lungs daily.  Marland Kitchen lovastatin (MEVACOR) 40 MG tablet Take 40 mg by mouth daily.   . pantoprazole (PROTONIX) 40 MG tablet Take 1 tablet (40 mg total) by mouth daily.  . sertraline (ZOLOFT) 100 MG tablet Take 100 mg by mouth daily.    Marland Kitchen  tiotropium (SPIRIVA) 18 MCG inhalation capsule Place 18 mcg into inhaler and inhale daily.    . traMADol (ULTRAM) 50 MG tablet Take 1 tablet (50 mg total) by mouth every 6 (six) hours as needed for moderate pain.  . valsartan-hydrochlorothiazide (DIOVAN-HCT) 320-12.5 MG per tablet Take 1 tablet by mouth daily.    . Vitamin D, Ergocalciferol, (DRISDOL) 1.25 MG (50000 UT) CAPS capsule Take 50,000 Units by mouth every 7 (seven) days.  . furosemide (LASIX) 20 MG tablet Take 1 tablet (20 mg total) by mouth 3 (three) times a week. Take it on Mon, Wed and Fri (Patient not taking: Reported on 11/26/2019)  . potassium chloride SA (KLOR-CON) 20 MEQ tablet Take 1 tablet (20 mEq total) by mouth 3 (three) times a week. (Patient not taking: Reported on 11/26/2019)   No facility-administered encounter medications on file as of 11/26/2019.    Review of Systems:  Review of Systems  Constitutional: Positive for activity change.  HENT: Negative.   Respiratory: Negative.   Cardiovascular: Positive for leg swelling.  Gastrointestinal: Positive for constipation and diarrhea.  Genitourinary: Negative.   Musculoskeletal: Positive for gait problem.  Neurological: Positive for weakness.  Psychiatric/Behavioral: Positive for dysphoric mood and sleep disturbance.    Health Maintenance  Topic Date Due  . TETANUS/TDAP  Never done  . DEXA SCAN  Never done  . PNA vac Low Risk Adult (1 of 2 - PCV13) Never done  . INFLUENZA VACCINE  Completed    Physical Exam: Vitals:   11/26/19 1355  BP: 134/70  Pulse: 96  Temp: 97.9 F (36.6 C)  SpO2: 92%  Weight: 131 lb 3.2 oz (59.5 kg)  Height: 5' 2.5" (1.588 m)   Body mass index is 23.61 kg/m. Physical Exam  Constitutional: Oriented to person, place, and time. Well-developed and well-nourished.  HENT:  Head: Normocephalic.  Mouth/Throat: Oropharynx is clear and moist.  Eyes: Pupils are equal, round, and reactive to light.  Neck: Neck supple.  Cardiovascular:  Normal rate and normal heart sounds.  Murmur heard Pulmonary/Chest: Effort normal and breath sounds normal. No respiratory distress. No wheezes. She has no rales.  Abdominal: Soft. Bowel sounds are normal. No distension. There is no tenderness. There is no rebound.  Musculoskeletal: Mild edema Bilateral Lymphadenopathy: none Neurological: Alert and oriented to person, place, and time.  Skin: Skin is warm and dry.  Psychiatric: Normal mood and affect. Behavior is normal. Thought content normal.    Labs reviewed: Basic Metabolic Panel: Recent Labs    05/08/19 0800 08/06/19 1443 09/19/19 0853  NA 136 140 137  K 3.7 3.1* 4.1  CL 98 97* 100  CO2 29 28 28   GLUCOSE 101* 103* 94  BUN 25 21 25   CREATININE 0.99* 0.89* 0.67  CALCIUM 9.0 9.4 9.4  TSH 1.11  --   --  Liver Function Tests: Recent Labs    05/08/19 0800  AST 18  ALT 11  BILITOT 0.5  PROT 6.3   No results for input(s): LIPASE, AMYLASE in the last 8760 hours. No results for input(s): AMMONIA in the last 8760 hours. CBC: Recent Labs    05/08/19 0800  WBC 6.4  NEUTROABS 4,179  HGB 11.0*  HCT 32.4*  MCV 87.6  PLT 302   Lipid Panel: Recent Labs    05/08/19 0800  CHOL 175  HDL 65  LDLCALC 91  TRIG 101  CHOLHDL 2.7   No results found for: HGBA1C  Procedures since last visit: No results found.  Assessment/Plan  Bilateral leg edema Can use Lasix PRN with Potassium  Osteoporosis, \ Gets Reclast from her Rheumatologist Vit D level was good Will d/w patient to reduce the dose to QD  Essential hypertension - Plan: CBC with Differential/Platelet, Lipid panel Controlled on Diovan and Norvasc  Nonrheumatic aortic insufficiency with aortic stenosis Follows with Cardiology  Depression, recurrent ([place]) On Zoloft Does not want any changes right now  Hyponatremia - Plan: COMPLETE METABOLIC PANEL WITH GFR Has been stable Hyerlipidemia On Mevacor Continue the same  Chronic Back Pain Tylenol and  tramadol PRN COPD On Breo and Spiriva Alternate Diarrhea and Constipation Seen by GI On Amitiza but not taking it right now   Labs/tests ordered:  * No order type specified * Next appt:  Visit date not found

## 2019-11-27 DIAGNOSIS — R600 Localized edema: Secondary | ICD-10-CM | POA: Insufficient documentation

## 2020-01-02 ENCOUNTER — Other Ambulatory Visit (HOSPITAL_COMMUNITY): Payer: Medicare Other

## 2020-01-20 ENCOUNTER — Other Ambulatory Visit (HOSPITAL_COMMUNITY): Payer: Medicare PPO

## 2020-01-21 ENCOUNTER — Encounter: Payer: Self-pay | Admitting: Family

## 2020-01-21 ENCOUNTER — Other Ambulatory Visit: Payer: Self-pay | Admitting: Family

## 2020-01-21 ENCOUNTER — Ambulatory Visit (INDEPENDENT_AMBULATORY_CARE_PROVIDER_SITE_OTHER): Payer: Medicare PPO | Admitting: Family

## 2020-01-21 ENCOUNTER — Other Ambulatory Visit: Payer: Self-pay

## 2020-01-21 ENCOUNTER — Ambulatory Visit
Admission: RE | Admit: 2020-01-21 | Discharge: 2020-01-21 | Disposition: A | Discharge status: Home / Self Care | Payer: Medicare PPO | Source: Ambulatory Visit | Attending: Family | Admitting: Family

## 2020-01-21 VITALS — BP 136/64 | HR 82 | Temp 96.9°F | Ht 63.0 in | Wt 129.0 lb

## 2020-01-21 DIAGNOSIS — M25551 Pain in right hip: Secondary | ICD-10-CM | POA: Diagnosis not present

## 2020-01-21 DIAGNOSIS — M1611 Unilateral primary osteoarthritis, right hip: Secondary | ICD-10-CM | POA: Diagnosis not present

## 2020-01-21 IMAGING — CR DG HIP (WITH OR WITHOUT PELVIS) 2-3V*R*
2 series · 2 of 2 positions shown · non-contrast
Comparison: CT abdomen pelvis [DATE]

CLINICAL DATA: Right hip pain, no known injury for 1 week

EXAM:
DG HIP (WITH OR WITHOUT PELVIS) 2-3V RIGHT

[w hip ap right]
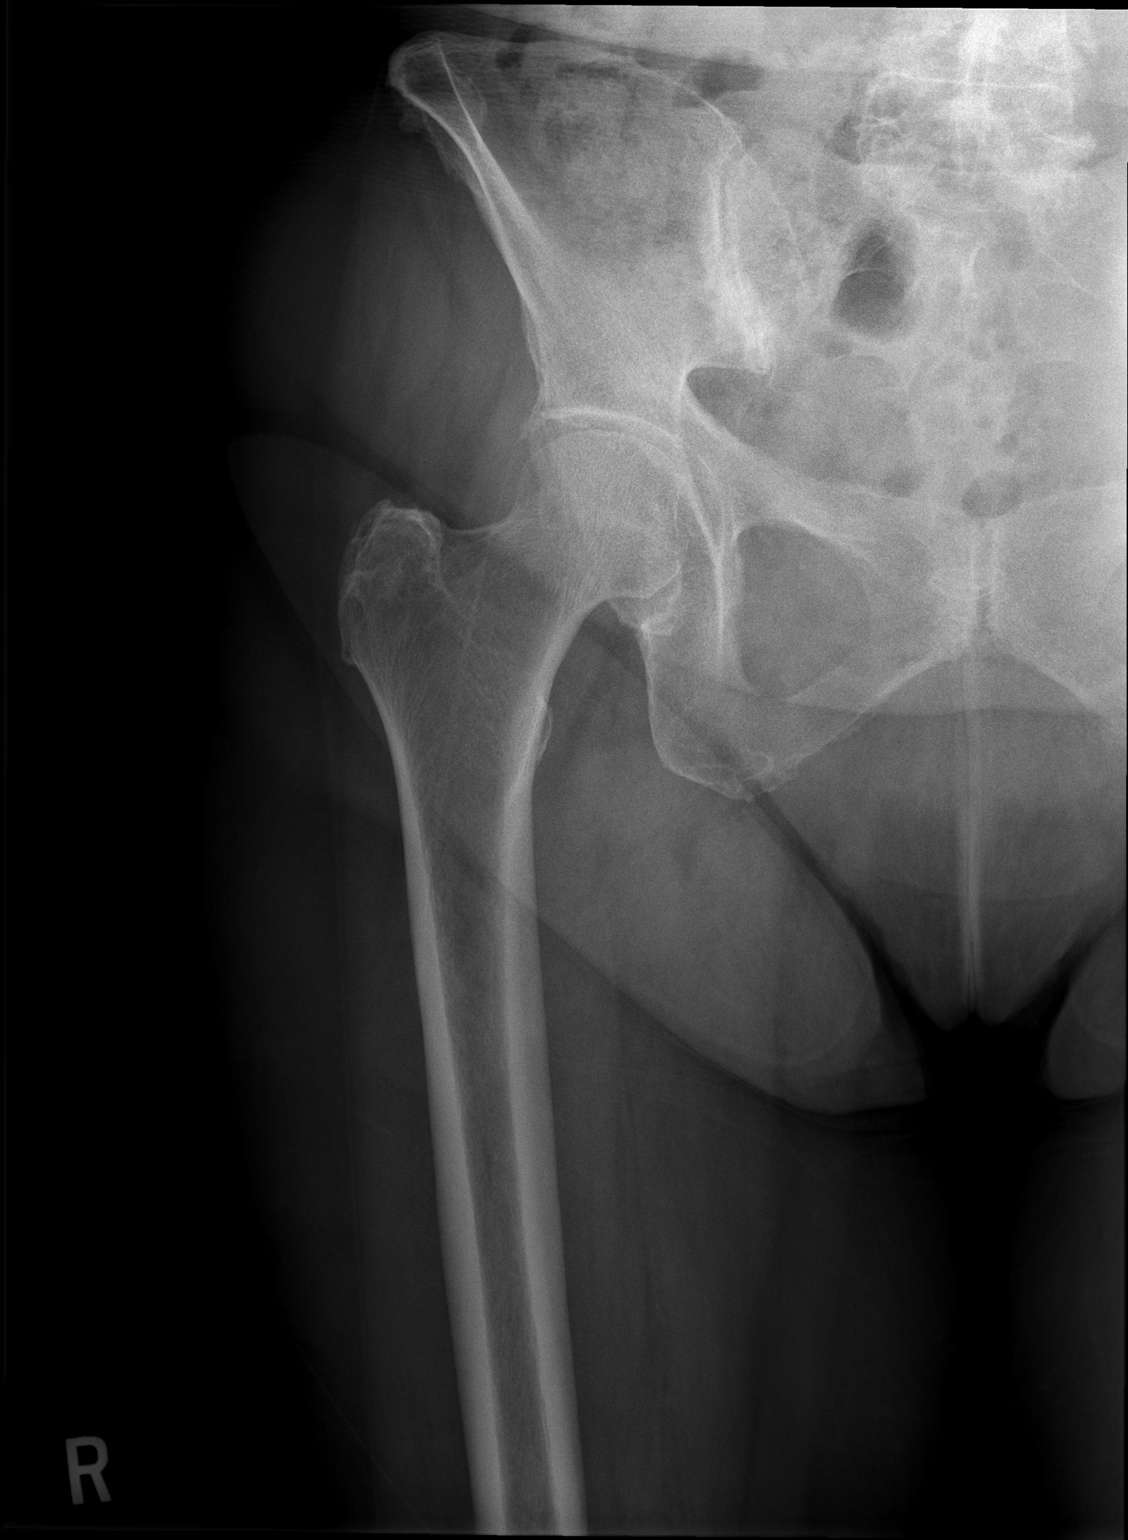

[w hip frog right]
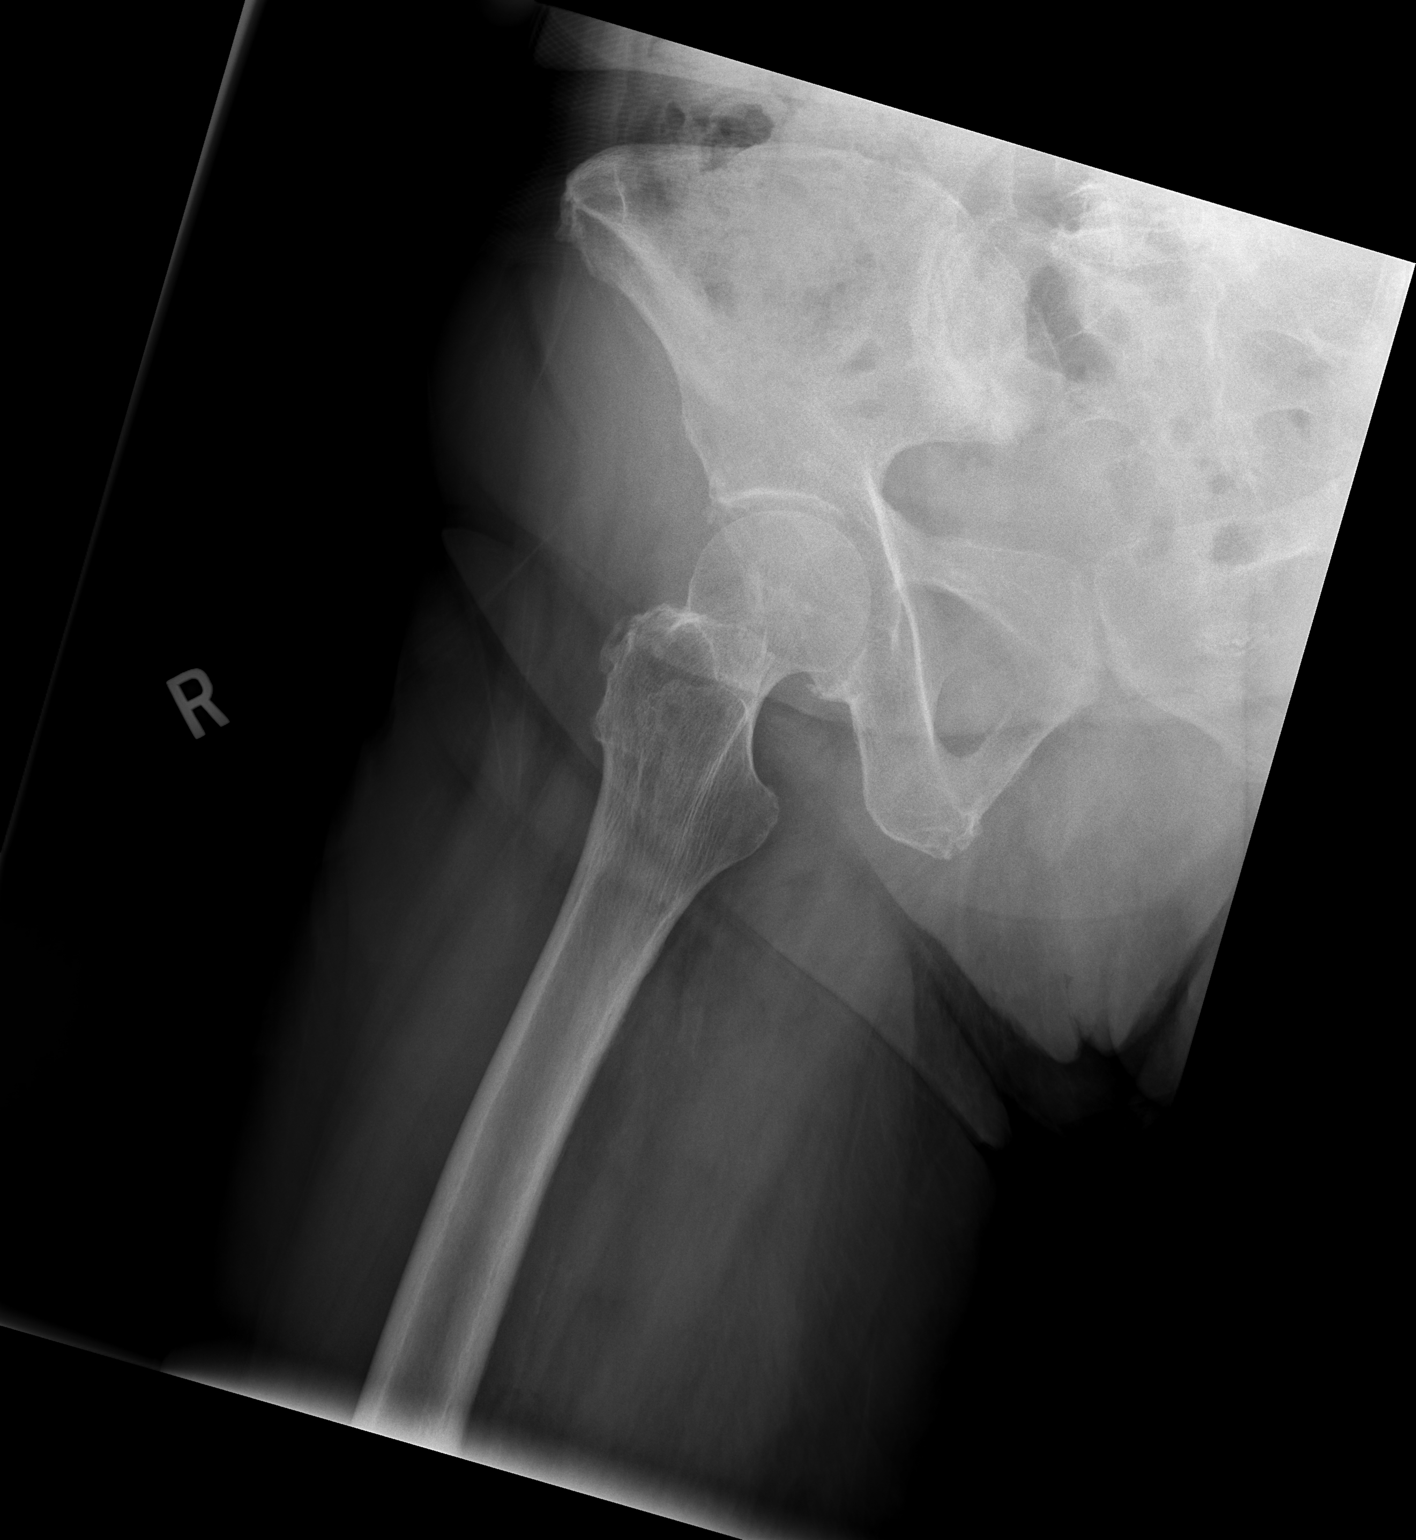

[2 of 2 positions shown; findings below may reference images not displayed]

FINDINGS: Included portions of the bony pelvis appear intact and congruent
without abnormal diastatic widening of the right SI joint and
symphysis pubis. There is arthrosis of the SI joint with sclerotic
change on both the ilium and sacral ala similar to comparison CT and
likely reflecting change from prior sacroiliitis. The right femur is
normally located with mild to moderate acetabular degenerative
changes.
IMPRESSION: 1. No acute osseous abnormality of the right hip.
2. Mild to moderate right hip arthrosis.
3. Right SI joint arthrosis with sclerotic change on both the ilium
and sacral ala suggesting change from prior sacroiliitis.

## 2020-01-21 NOTE — Progress Notes (Signed)
Provider: Twanda Stakes FNP-C  Mahlon Gammon, MD  Patient Care Team: Mahlon Gammon, MD as PCP - General (Internal Medicine)  Extended Emergency Contact Information Primary Emergency Contact: Jenell Milliner Address: [address]          [place], [place] [address] [place] of [place] Home Phone: [phone] Mobile Phone: [phone] Relation: Spouse  Code Status:  Full Code  Goals of care: Advanced Directive information Advanced Directives 09/04/2018  Does Patient Have a Medical Advance Directive? Yes  Type of Estate agent of [place];Living will  Does patient want to make changes to medical advance directive? No - Patient declined  Copy of Healthcare Power of Attorney in Chart? No - copy requested     Chief Complaint  Patient presents with  . Acute Visit    Right hip pain    HPI:  Pt is a 86 y.o. female seen today for an acute visit for evaluation of right hip since Monday 01/19/2020.Pain described as sharp.bending over makes it worst.Heating pad and sitting makes it better.Right hip seems to be higher than the left.Has take tylenol extra strength and tramadol eases off but does not go away.has also used Biofreeze.No radiation.No numbness,or tingling.she denies any trauma or fall episode. States husband died one week ago.Trying to copy with loss.on Sertraline 100 mg tablet daily.   Past Medical History:  Diagnosis Date  . Arthritis   . COPD (chronic obstructive pulmonary disease) (HCC)   . Depression   . Diverticulosis   . GERD (gastroesophageal reflux disease)   . Heart murmur   . Hyperlipemia   . Hypertension   . Irregular heart beat 2014   PVCs  . Moderate aortic stenosis by prior echocardiogram 06/11/2019   Echo November 2020: EF 60-65%. Mild basal septal LVH, GR 2 DD. Mod LA dilation.- High LVEDP. Severe AI, Mod AS-peak gradient 43 mmHg, mean 28 mmHg..  . Osteoporosis    Past Surgical History:  Procedure Laterality Date  . ABDOMINAL  HYSTERECTOMY     both appy and hyst done together  . APPENDECTOMY    . CATARACT EXTRACTION    . TRANSTHORACIC ECHOCARDIOGRAM  06/2019   EF 60-65%. Mild basal septal LVH, GR 2 DD. Mod LA dilation.- High LVEDP. Severe AI, Mod AS-peak gradient 43 mmHg, mean 28 mmHg..    Allergies  Allergen Reactions  . Fosamax [Alendronate Sodium]     Poor healing to mouth wound  . Augmentin [Amoxicillin-Pot Clavulanate] Nausea Only    DID THE REACTION INVOLVE: Swelling of the face/tongue/throat, SOB, or low BP? No Sudden or severe rash/hives, skin peeling, or the inside of the mouth or nose? No Did it require medical treatment? No When did it last happen? If all above answers are "NO", may proceed with cephalosporin use.   Marland Kitchen Hydrocodone Nausea And Vomiting  . Mobic [Meloxicam] Rash  . Sulfonamide Derivatives Rash    Outpatient Encounter Medications as of 01/21/2020  Medication Sig  . acetaminophen (TYLENOL) 650 MG CR tablet Take 650 mg by mouth in the morning, at noon, and at bedtime.   Marland Kitchen amLODipine (NORVASC) 2.5 MG tablet Take 2.5 mg by mouth daily.  Marland Kitchen aspirin 81 MG tablet Take 81 mg by mouth daily.  . fluticasone (FLONASE) 50 MCG/ACT nasal spray Place 1 spray into both nostrils as needed.   . fluticasone furoate-vilanterol (BREO ELLIPTA) 100-25 MCG/INH AEPB Inhale 1 puff into the lungs daily.  Marland Kitchen lovastatin (MEVACOR) 40 MG tablet Take 40 mg by mouth daily.   Marland Kitchen  sertraline (ZOLOFT) 100 MG tablet Take 100 mg by mouth daily.    Marland Kitchen tiotropium (SPIRIVA) 18 MCG inhalation capsule Place 18 mcg into inhaler and inhale daily.    . traMADol (ULTRAM) 50 MG tablet Take 1 tablet (50 mg total) by mouth every 6 (six) hours as needed for moderate pain.  . valsartan-hydrochlorothiazide (DIOVAN-HCT) 320-12.5 MG per tablet Take 1 tablet by mouth daily.    . Vitamin D, Ergocalciferol, (DRISDOL) 1.25 MG (50000 UT) CAPS capsule Take 50,000 Units by mouth every 7 (seven) days.  . [DISCONTINUED] albuterol (PROVENTIL  HFA;VENTOLIN HFA) 108 (90 Base) MCG/ACT inhaler Inhale 2 puffs into the lungs every 4 (four) hours as needed for wheezing or shortness of breath. (Patient not taking: Reported on 01/21/2020)  . [DISCONTINUED] AMITIZA 8 MCG capsule TAKE 1 CAPSULE (8 MCG TOTAL) BY MOUTH 2 (TWO) TIMES DAILY WITH A MEAL. (Patient not taking: Patient not taking it)  . [DISCONTINUED] furosemide (LASIX) 20 MG tablet Take 1 tablet (20 mg total) by mouth 3 (three) times a week. Take it on Mon, Wed and Fri (Patient not taking: Reported on 11/26/2019)  . [DISCONTINUED] pantoprazole (PROTONIX) 40 MG tablet Take 1 tablet (40 mg total) by mouth daily.  . [DISCONTINUED] potassium chloride SA (KLOR-CON) 20 MEQ tablet Take 1 tablet (20 mEq total) by mouth 3 (three) times a week. (Patient not taking: Reported on 11/26/2019)   No facility-administered encounter medications on file as of 01/21/2020.    Review of Systems  Constitutional: Negative for appetite change, chills, fatigue and fever.  Respiratory: Negative for cough, chest tightness, shortness of breath and wheezing.   Cardiovascular: Negative for chest pain, palpitations and leg swelling.  Gastrointestinal: Negative for abdominal distention, abdominal pain, constipation, diarrhea, nausea and vomiting.  Musculoskeletal: Positive for arthralgias and gait problem. Negative for joint swelling and myalgias.       Right hip pain per HPI   Neurological: Negative for speech difficulty, weakness and numbness.  Psychiatric/Behavioral: Negative for agitation and confusion.       States Husband died one week ago.     Immunization History  Administered Date(s) Administered  . Influenza, High Dose Seasonal PF 06/11/2019   Pertinent  Health Maintenance Due  Topic Date Due  . DEXA SCAN  Never done  . PNA vac Low Risk Adult (1 of 2 - PCV13) Never done  . INFLUENZA VACCINE  03/28/2020   Fall Risk  11/26/2019 07/23/2019  Falls in the past year? 1 0  Number falls in past yr: 1 0    Injury with Fall? 0 -    Vitals:   01/21/20 1314  BP: 136/64  Pulse: 82  Temp: (!) 96.9 F (36.1 C)  TempSrc: Temporal  SpO2: 98%  Weight: 129 lb (58.5 kg)  Height: 5\' 3"  (1.6 m)   Body mass index is 22.85 kg/m. Physical Exam Vitals reviewed.  Constitutional:      General: She is not in acute distress.    Appearance: She is not ill-appearing.  Eyes:     General: No scleral icterus.       Right eye: No discharge.        Left eye: No discharge.     Conjunctiva/sclera: Conjunctivae normal.     Pupils: Pupils are equal, round, and reactive to light.  Cardiovascular:     Rate and Rhythm: Normal rate and regular rhythm.     Pulses: Normal pulses.     Heart sounds: Murmur present. No friction rub. No gallop.  Pulmonary:     Effort: Pulmonary effort is normal. No respiratory distress.     Breath sounds: Normal breath sounds. No wheezing, rhonchi or rales.  Chest:     Chest wall: No tenderness.  Abdominal:     General: Bowel sounds are normal. There is no distension.     Palpations: Abdomen is soft. There is no mass.     Tenderness: There is no abdominal tenderness. There is no right CVA tenderness, left CVA tenderness, guarding or rebound.  Musculoskeletal:        General: No swelling or tenderness.     Right hip: No deformity, tenderness or crepitus. Normal range of motion. Normal strength.     Left hip: Normal.     Right lower leg: No edema.     Left lower leg: No edema.     Comments: unsteady gait ambulates with walker.  Skin:    General: Skin is warm.     Coloration: Skin is not pale.     Findings: No bruising, erythema, lesion or rash.  Neurological:     Mental Status: She is alert and oriented to person, place, and time.     Cranial Nerves: No cranial nerve deficit.     Sensory: No sensory deficit.     Motor: No weakness.     Gait: Gait abnormal.    Labs reviewed: Recent Labs    05/08/19 0800 08/06/19 1443 09/19/19 0853  NA 136 140 137  K 3.7 3.1*  4.1  CL 98 97* 100  CO2 29 28 28   GLUCOSE 101* 103* 94  BUN 25 21 25   CREATININE 0.99* 0.89* 0.67  CALCIUM 9.0 9.4 9.4   Recent Labs    05/08/19 0800  AST 18  ALT 11  BILITOT 0.5  PROT 6.3   Recent Labs    05/08/19 0800  WBC 6.4  NEUTROABS 4,179  HGB 11.0*  HCT 32.4*  MCV 87.6  PLT 302   Lab Results  Component Value Date   TSH 1.11 05/08/2019   No results found for: HGBA1C Lab Results  Component Value Date   CHOL 175 05/08/2019   HDL 65 05/08/2019   LDLCALC 91 05/08/2019   TRIG 101 05/08/2019   CHOLHDL 2.7 05/08/2019    Significant Diagnostic Results in last 30 days:  No results found.  Assessment/Plan   Acute right hip pain Right hip exam non-tender to palpation.No bruise,erythema or swelling. Grimaces with extension of right leg.will obtain imaging to rule out any acute abnormalities and fractures. - continue with Tramadol 50 mg tablet every 6 hrs and extra strength tylenol as needed - Dg right hip w/o pelvis 2-3 views X-ray.Advised to get right hip X-ray done at [address] [place] at [facility].Step son drove her to visit today. - will refer to Orthopedic if indicated.    Family/ staff Communication: Reviewed plan of care with patient verbalized understanding.  Labs/tests ordered:- Dg right hip w/o pelvis 2-3 views X-ray  Next Appointment: As needed if symptoms worsen or fail to improve.  [place], NP

## 2020-01-21 NOTE — Patient Instructions (Signed)
-   continue with Tramadol 50 mg tablet every 6 hrs and extra strength tylenol as needed - please get right hip X-ray done at [address] [place] at [facility]

## 2020-01-23 DIAGNOSIS — M545 Low back pain: Secondary | ICD-10-CM | POA: Diagnosis not present

## 2020-01-30 ENCOUNTER — Ambulatory Visit: Payer: Medicare Other | Admitting: Cardiology

## 2020-02-12 ENCOUNTER — Other Ambulatory Visit (HOSPITAL_COMMUNITY): Payer: Medicare PPO

## 2020-02-13 ENCOUNTER — Other Ambulatory Visit (HOSPITAL_COMMUNITY): Payer: Medicare PPO

## 2020-03-08 ENCOUNTER — Ambulatory Visit (HOSPITAL_COMMUNITY): Payer: Medicare PPO | Attending: Cardiology

## 2020-03-08 ENCOUNTER — Other Ambulatory Visit: Payer: Self-pay

## 2020-03-08 DIAGNOSIS — I350 Nonrheumatic aortic (valve) stenosis: Secondary | ICD-10-CM | POA: Diagnosis not present

## 2020-03-17 DIAGNOSIS — I10 Essential (primary) hypertension: Secondary | ICD-10-CM | POA: Diagnosis not present

## 2020-03-17 DIAGNOSIS — E871 Hypo-osmolality and hyponatremia: Secondary | ICD-10-CM | POA: Diagnosis not present

## 2020-03-18 ENCOUNTER — Other Ambulatory Visit: Payer: Self-pay

## 2020-03-18 DIAGNOSIS — I10 Essential (primary) hypertension: Secondary | ICD-10-CM

## 2020-03-18 DIAGNOSIS — E871 Hypo-osmolality and hyponatremia: Secondary | ICD-10-CM

## 2020-03-18 LAB — COMPLETE METABOLIC PANEL WITH GFR
AG Ratio: 1.4 (calc) (ref 1.0–2.5)
ALT: 9 U/L (ref 6–29)
AST: 15 U/L (ref 10–35)
Albumin: 3.9 g/dL (ref 3.6–5.1)
Alkaline phosphatase (APISO): 74 U/L (ref 37–153)
BUN: 18 mg/dL (ref 7–25)
CO2: 29 mmol/L (ref 20–32)
Calcium: 9.3 mg/dL (ref 8.6–10.4)
Chloride: 98 mmol/L (ref 98–110)
Creat: 0.78 mg/dL (ref 0.60–0.88)
GFR, Est African American: 80 mL/min/{1.73_m2} (ref >=60)
GFR, Est Non African American: 69 mL/min/{1.73_m2} (ref >=60)
Globulin: 2.7 g/dL (calc) (ref 1.9–3.7)
Glucose, Bld: 88 mg/dL (ref 65–99)
Potassium: 3.7 mmol/L (ref 3.5–5.3)
Sodium: 138 mmol/L (ref 135–146)
Total Bilirubin: 0.5 mg/dL (ref 0.2–1.2)
Total Protein: 6.6 g/dL (ref 6.1–8.1)

## 2020-03-18 LAB — LIPID PANEL
Cholesterol: 175 mg/dL (ref <200)
HDL: 73 mg/dL (ref >=50)
LDL Cholesterol (Calc): 84 mg/dL (calc)
Non-HDL Cholesterol (Calc): 102 mg/dL (calc) (ref <130)
Total CHOL/HDL Ratio: 2.4 (calc) (ref <5.0)
Triglycerides: 89 mg/dL (ref <150)

## 2020-03-18 LAB — CBC WITH DIFFERENTIAL/PLATELET
Absolute Monocytes: 778 cells/uL (ref 200–950)
Basophils Absolute: 97 cells/uL (ref 0–200)
Basophils Relative: 1.2 %
Eosinophils Absolute: 527 cells/uL — ABNORMAL HIGH (ref 15–500)
Eosinophils Relative: 6.5 %
HCT: 34.4 % — ABNORMAL LOW (ref 35.0–45.0)
Hemoglobin: 11.8 g/dL (ref 11.7–15.5)
Lymphs Abs: 1863 cells/uL (ref 850–3900)
MCH: 31.1 pg (ref 27.0–33.0)
MCHC: 34.3 g/dL (ref 32.0–36.0)
MCV: 90.8 fL (ref 80.0–100.0)
MPV: 9.1 fL (ref 7.5–12.5)
Monocytes Relative: 9.6 %
Neutro Abs: 4836 cells/uL (ref 1500–7800)
Neutrophils Relative %: 59.7 %
Platelets: 304 10*3/uL (ref 140–400)
RBC: 3.79 10*6/uL — ABNORMAL LOW (ref 3.80–5.10)
RDW: 12.6 % (ref 11.0–15.0)
Total Lymphocyte: 23 %
WBC: 8.1 10*3/uL (ref 3.8–10.8)

## 2020-03-22 ENCOUNTER — Other Ambulatory Visit: Payer: Self-pay | Admitting: Internal Medicine

## 2020-03-23 ENCOUNTER — Encounter: Payer: Self-pay | Admitting: Nurse Practitioner

## 2020-03-24 ENCOUNTER — Encounter: Payer: Self-pay | Admitting: Internal Medicine

## 2020-03-26 ENCOUNTER — Ambulatory Visit: Payer: Medicare PPO | Admitting: Cardiology

## 2020-03-26 ENCOUNTER — Other Ambulatory Visit: Payer: Self-pay

## 2020-03-26 ENCOUNTER — Encounter: Payer: Self-pay | Admitting: Cardiology

## 2020-03-26 VITALS — BP 166/68 | HR 88 | Ht 62.0 in | Wt 129.6 lb

## 2020-03-26 DIAGNOSIS — I351 Nonrheumatic aortic (valve) insufficiency: Secondary | ICD-10-CM

## 2020-03-26 DIAGNOSIS — I493 Ventricular premature depolarization: Secondary | ICD-10-CM

## 2020-03-26 DIAGNOSIS — I10 Essential (primary) hypertension: Secondary | ICD-10-CM

## 2020-03-26 DIAGNOSIS — I350 Nonrheumatic aortic (valve) stenosis: Secondary | ICD-10-CM | POA: Diagnosis not present

## 2020-03-26 NOTE — Progress Notes (Signed)
Primary Care Provider: Mahlon Gammon, MD Cardiologist: Bryan Lemma, MD  --> Previously seen by Dr. Donnie Aho Electrophysiologist: None  Clinic Note: Chief Complaint  Patient presents with  . Follow-up    Echo results  . Aortic Stenosis    With regurgitation--both now severe   HPI:    Robin [name] is a 86 y.o. female with significant aortic valve disease (AI and AS) who presents today for follow-up study to discuss results of echocardiogram.  Robin [name] was last seen on August 04, 2019 (she was accompanied by her stepson). We discussed the results of her echocardiogram. She was doing relatively well. Used a walker to walk in. Doing relatively well. Minimal edema. Maybe takes Lasix 3 days a week. No real complaint of exertional dyspnea, more limited by back pain and unsteady gait. No syncope or near syncope. No TIA/amaurosis fugax or chest pain/pressure with rest or exertion.  Recent Hospitalizations: None  Reviewed  CV studies:    The following studies were reviewed today: (if available, images/films reviewed: From Epic Chart or Care Everywhere) . Echo (03/08/2020): Aortic stenosis is now severe with mean gradient 60 mmHg. Aortic regurgitation is moderate to severe. Hyperdynamic LV with EF of 70 to 75%. No R WMA. Moderate concentric LVH. GR 1 DD. normal longitudinal strain. Moderate mitral regurgitation. Moderate LA dilation.Marland Kitchen o Comparison(s): 07/14/19 EF 60-65%. Moderate AS peak PG, mean PG, Severe AI.  Interval History:   Robin [name] is accompanied here today by one of the caregivers from [facility] who is clearly well involved with her care. She seems very interested and has multiple questions.  Robin [name] is very anxious and nervous today because we called her back to discuss the results of her test. She manifest this by being anxious when talking to her but also her blood pressure is quite elevated.  She seems to have declined little bit since  her last visit. This is partly because she is coping with the loss of her husband over the last few months. She is doing physical therapy to help with her balance issues, but is really not doing much walking. She does use a walker and is able to get down to the cafeteria, but this takes her quite a bit of time.  No real issues with edema. She does has a lot of exercise intolerance and fatigue. The caregiver indicates that she probably does seem a little more short of breath than she had been with walking, but not profoundly. No chest pain or pressure with rest exertion. Although she has dizziness and poor balance, she has no syncope or near syncope symptoms.  No signs symptoms of arrhythmias.  The patient does not have symptoms concerning for COVID-19 infection (fever, chills, cough, or new shortness of breath).  The patient is practicing social distancing & Masking.    REVIEWED OF SYSTEMS   Review of Systems  Constitutional: Positive for malaise/fatigue (Does not have good exercise tolerance). Negative for weight loss.  HENT: Positive for hearing loss. Negative for nosebleeds.   Respiratory: Negative for shortness of breath.   Gastrointestinal: Negative for blood in stool and melena.  Musculoskeletal: Positive for back pain and joint pain (Hip).       Very much limit activity. She is doing PT.  Psychiatric/Behavioral: Positive for memory loss. Negative for depression (Mostly just grieving. Not true depression. Loss of her husband.). The patient is nervous/anxious (Notably anxious today. But anxiety has been a bigger issue since  her husband died.) and has insomnia (She does have a hard time getting to sleep and staying asleep).    I have reviewed and (if needed) personally updated the patient's problem list, medications, allergies, past medical and surgical history, social and family history.   PAST MEDICAL HISTORY   Past Medical History:  Diagnosis Date  . Arthritis   . COPD (chronic  obstructive pulmonary disease) (HCC)   . Depression   . Diverticulosis   . GERD (gastroesophageal reflux disease)   . Hyperlipemia   . Hypertension   . Irregular heart beat 2014   PVCs  . Moderate to severe aortic insufficiency 07/2019   Echo (03/08/2020): Aortic stenosis is now severe with mean gradient 60 mmHg. Aortic regurgitation is moderate to severe.  . Osteoporosis   . Severe aortic stenosis 06/11/2019   Echo 06/2019: EF 60-65%. Mild basal septal LVH, GR 2 DD. Mod LA dilation.- High LVEDP. Severe AI, Mod AS-peak gradient 43 mmHg, mean 28 mmHg;; 02/2020: Mean AoV Gradient 60 mmHg with Mod AI. EF 70-755.  Mod LVH. Mod LA dilation. Mod MR.    PAST SURGICAL HISTORY   Past Surgical History:  Procedure Laterality Date  . ABDOMINAL HYSTERECTOMY     both appy and hyst done together  . APPENDECTOMY    . CATARACT EXTRACTION    . TRANSTHORACIC ECHOCARDIOGRAM  06/2019   EF 60-65%. Mild basal septal LVH, GR 2 DD. Mod LA dilation.- High LVEDP. Severe AI, Mod AS-peak gradient 43 mmHg, mean 28 mmHg.Marland Kitchen.    MEDICATIONS/ALLERGIES   Current Meds  Medication Sig  . acetaminophen (TYLENOL) 650 MG CR tablet Take 650 mg by mouth in the morning, at noon, and at bedtime.   Marland Kitchen. amLODipine (NORVASC) 2.5 MG tablet TAKE 1 TABLET BY MOUTH EVERY DAY  . aspirin 81 MG tablet Take 81 mg by mouth daily.  . fluticasone (FLONASE) 50 MCG/ACT nasal spray Place 1 spray into both nostrils as needed.   . fluticasone furoate-vilanterol (BREO ELLIPTA) 100-25 MCG/INH AEPB Inhale 1 puff into the lungs daily.  Marland Kitchen. lovastatin (MEVACOR) 40 MG tablet Take 40 mg by mouth daily.   . sertraline (ZOLOFT) 100 MG tablet Take 100 mg by mouth daily.    Marland Kitchen. tiotropium (SPIRIVA) 18 MCG inhalation capsule Place 18 mcg into inhaler and inhale daily.    . traMADol (ULTRAM) 50 MG tablet Take 1 tablet (50 mg total) by mouth every 6 (six) hours as needed for moderate pain.  . valsartan-hydrochlorothiazide (DIOVAN-HCT) 320-12.5 MG per tablet Take  1 tablet by mouth daily.    . Vitamin D, Ergocalciferol, (DRISDOL) 1.25 MG (50000 UT) CAPS capsule Take 50,000 Units by mouth every 7 (seven) days.    Allergies  Allergen Reactions  . Fosamax [Alendronate Sodium]     Poor healing to mouth wound  . Augmentin [Amoxicillin-Pot Clavulanate] Nausea Only    DID THE REACTION INVOLVE: Swelling of the face/tongue/throat, SOB, or low BP? No Sudden or severe rash/hives, skin peeling, or the inside of the mouth or nose? No Did it require medical treatment? No When did it last happen? If all above answers are "NO", may proceed with cephalosporin use.   Marland Kitchen. Hydrocodone Nausea And Vomiting  . Mobic [Meloxicam] Rash  . Sulfonamide Derivatives Rash    SOCIAL HISTORY/FAMILY HISTORY   Reviewed in Epic:  Pertinent findings: Her 2nd husband [name](Ike - [place]) died 01/16/2020 -> longstanding Parkinson's disease complicated by dementia. [facility];  they had just moved in prior to onset of COVID 19 Pandemic LOCKDOWN.   --> Now finally able to work with PT/OT to rehab - chronic back pain with very unsteady gait/poor balance. -- Uses cane for short distance - but mostly uses Walker.  Really only walks to & from the cafeteria if not working with therapy.  OBJCTIVE -PE, EKG, labs   Wt Readings from Last 3 Encounters:  03/26/20 129 lb 9.6 oz (58.8 kg)  01/21/20 129 lb (58.5 kg)  11/26/19 131 lb 3.2 oz (59.5 kg)    Physical Exam: BP (!) 166/68   Pulse 88   Ht 5\' 2"  (1.575 m)   Wt 129 lb 9.6 oz (58.8 kg)   SpO2 95%   BMI 23.70 kg/m  Physical Exam Constitutional:      General: She is not in acute distress.    Appearance: Normal appearance. She is normal weight.     Comments: Very anxious and fearful appearing; more frail appearing.  HENT:     Head: Normocephalic and atraumatic.  Neck:     Vascular: Decreased carotid pulses (Delayed upstroke with brisk upstroke-jackhammer pulse.). No carotid  bruit (Exclude bruit, more likely radiating aortic stenosis murmur) or JVD.  Cardiovascular:     Rate and Rhythm: Normal rate and regular rhythm. Occasional extrasystoles are present.    Chest Wall: PMI is not displaced.     Pulses: Normal pulses.     Heart sounds: S1 normal and S2 normal. Heart sounds are distant. Murmur heard. High-pitched harsh crescendo-decrescendo mid to late systolic murmur is present at the upper right sternal border radiating to the neck. High-pitched blowing decrescendo early diastolic murmur is present with a grade of 2/4 at the upper right sternal border radiating to the apex.  No friction rub. No gallop.   Pulmonary:     Effort: Pulmonary effort is normal. No respiratory distress.     Breath sounds: Normal breath sounds. No rhonchi.  Chest:     Chest wall: No tenderness.  Musculoskeletal:        General: No swelling (Trivial). Normal range of motion.     Cervical back: Normal range of motion and neck supple.     Comments: Somewhat unsteady gait  Neurological:     General: No focal deficit present.     Mental Status: She is alert and oriented to person, place, and time.  Psychiatric:        Behavior: Behavior normal.        Judgment: Judgment normal.     Comments: She is clearly anxious, and almost sad. Very concerned about findings on her echocardiogram. She did become sad when discussing her husband's passing      Adult ECG Report n/a  Recent Labs:  n/a  Lab Results  Component Value Date   CHOL 175 03/17/2020   HDL 73 03/17/2020   LDLCALC 84 03/17/2020   TRIG 89 03/17/2020   CHOLHDL 2.4 03/17/2020   Lab Results  Component Value Date   CREATININE 0.78 03/17/2020   BUN 18 03/17/2020   NA 138 03/17/2020   K 3.7 03/17/2020   CL 98 03/17/2020   CO2 29 03/17/2020   Lab Results  Component Value Date   TSH 1.11 05/08/2019    ASSESSMENT/PLAN   PAIZLEE KINDER is here to discuss echocardiogram findings showing progression of her aortic  stenosis/insufficiency to severe on both accounts. Thankfully it does not appear that she is actively having any concerning symptoms of either  1 of these conditions, but she is not very physically active based on her back and hip pains.  She is currently undecided as to what direction she would like to go. She does want to get more information on how to proceed, but her current emotional state after her husband's death has made it difficult for her to make good conscious decision going forward. She is very tearful and very upset. Anyway she is not sure that she " has any reason to go on".    After a long discussion, we decided for her to move forward with CVTS consultation in order to make more informed decision.  Problem List Items Addressed This Visit    Severe aortic stenosis by prior echocardiogram - Primary (Chronic)    Follow-up echo now shows severe aortic stenosis with aortic insufficiency.  Is hard to tell if she is having symptoms or not. She clearly is not having any chest pain or pressure, no syncope or near syncope. However she may be having dyspnea that is masked by the fact that she is not able to do much exercise.  We had a long talk as to what she would like to do going forward with her management. We discussed whether or not she would be interested in procedures or not. After long discussion with Morelia and her caregiver, we decided that reasonable course of action will be to refer her to see the cardiac surgeons in the Aortic Valve Clinic to discuss the possibility of aortic valve replacement via TAVR or open. I suspected given her age TAVR would be the plan, but not sure with aortic insufficiency with if this would be acceptable.  She is not convinced that she would be interested in going forward with procedures, but after our discussion, we felt that it is best for her to be able to make a decision with all the available information.  We discussed concerning symptoms, as much  for the benefit of her caregiver as per Britta Mccreedy who will pay close attention to see if any concerning symptoms are occurring.  I will see her back once the decision is made.      Relevant Orders   Ambulatory referral to Structural Heart/Valve Clinic (only at [facility])   Severe aortic insufficiency (Chronic)    Combination of aortic stenosis with insufficiency-have been noted on exam, but symptoms are very difficult to assess.  Plan is to refer to CVTS-Valve Clinic to discuss potential options of aortic valve placement. Based on this information, she can decide whether not she would want to proceed or simply treat expectantly.      Relevant Orders   Ambulatory referral to Structural Heart/Valve Clinic (only at [facility])   Essential hypertension (Chronic)    Her blood pressure is very high today but that is very unusual for her. I am not change her current medications for now. She is on low-dose amlodipine plus next dose valsartan with HCTZ.      PVC's (premature ventricular contractions) (Chronic)    Not very symptomatic.  We may want to consider beta-blocker, but I am concerned about fatigue.          COVID-19 Education: The signs and symptoms of COVID-19 were discussed with the patient and how to seek care for testing (follow up with PCP or arrange E-visit).   The importance of social distancing and COVID-19 vaccination was discussed today.  I spent a total of with the patient. >  50% of the time  was spent in direct patient consultation.  Additional time spent with chart review  / charting (studies, outside notes, etc): 12 Total Time: 32 min   Current medicines are reviewed at length with the patient today.  (+/- concerns) n/a  Notice: This dictation was prepared with Dragon dictation along with smaller phrase technology. Any transcriptional errors that result from this process are unintentional and may not be corrected upon review.  Patient Instructions /  Medication Changes & Studies & Tests Ordered   Patient Instructions  Medication Instructions:  Your physician recommends that you continue on your current medications as directed. Please refer to the Current Medication list given to you today.  *If you need a refill on your cardiac medications before your next appointment, please call your pharmacy*  Lab Work: NONE ordered at this time of appointment   If you have labs (blood work) drawn today and your tests are completely normal, you will receive your results only by: Marland Kitchen MyChart Message (if you have MyChart) OR . A paper copy in the mail If you have any lab test that is abnormal or we need to change your treatment, we will call you to review the results.  Testing/Procedures: You have been referred to Structural Heart/Valve clinic  Follow-Up: At [facility], you and your health needs are our priority.  As part of our continuing mission to provide you with exceptional heart care, we have created designated Provider Care Teams.  These Care Teams include your primary Cardiologist (physician) and Advanced Practice Providers (APPs -  Physician Assistants and Nurse Practitioners) who all work together to provide you with the care you need, when you need it.  We recommend signing up for the patient portal called "MyChart".  Sign up information is provided on this After Visit Summary.  MyChart is used to connect with patients for Virtual Visits (Telemedicine).  Patients are able to view lab/test results, encounter notes, upcoming appointments, etc.  Non-urgent messages can be sent to your provider as well.   To learn more about what you can do with MyChart, go to [email or web address].    Your next appointment:   4 month(s)  The format for your next appointment:   In Person  Provider:   Bryan Lemma, MD  Other Instructions      Studies Ordered:   Orders Placed This Encounter  Procedures  . Ambulatory referral to Structural  Heart/Valve Clinic (only at [facility])     Bryan Lemma, M.D., M.S. Interventional Cardiologist   Pager # [phone] Phone # [phone] [address] [place], [place] [address]   Thank you for choosing Heartcare at [facility]!!

## 2020-03-26 NOTE — Patient Instructions (Signed)
Medication Instructions:  Your physician recommends that you continue on your current medications as directed. Please refer to the Current Medication list given to you today.  *If you need a refill on your cardiac medications before your next appointment, please call your pharmacy*  Lab Work: NONE ordered at this time of appointment   If you have labs (blood work) drawn today and your tests are completely normal, you will receive your results only by: Marland Kitchen MyChart Message (if you have MyChart) OR . A paper copy in the mail If you have any lab test that is abnormal or we need to change your treatment, we will call you to review the results.  Testing/Procedures: You have been referred to Structural Heart/Valve clinic  Follow-Up: At [facility], you and your health needs are our priority.  As part of our continuing mission to provide you with exceptional heart care, we have created designated Provider Care Teams.  These Care Teams include your primary Cardiologist (physician) and Advanced Practice Providers (APPs -  Physician Assistants and Nurse Practitioners) who all work together to provide you with the care you need, when you need it.  We recommend signing up for the patient portal called "MyChart".  Sign up information is provided on this After Visit Summary.  MyChart is used to connect with patients for Virtual Visits (Telemedicine).  Patients are able to view lab/test results, encounter notes, upcoming appointments, etc.  Non-urgent messages can be sent to your provider as well.   To learn more about what you can do with MyChart, go to [email or web address].    Your next appointment:   4 month(s)  The format for your next appointment:   In Person  Provider:   Bryan Lemma, MD  Other Instructions

## 2020-03-29 ENCOUNTER — Encounter: Payer: Self-pay | Admitting: Cardiology

## 2020-03-29 NOTE — Assessment & Plan Note (Signed)
Not very symptomatic.  We may want to consider beta-blocker, but I am concerned about fatigue.

## 2020-03-29 NOTE — Assessment & Plan Note (Signed)
Her blood pressure is very high today but that is very unusual for her. I am not change her current medications for now. She is on low-dose amlodipine plus next dose valsartan with HCTZ.

## 2020-03-29 NOTE — Assessment & Plan Note (Signed)
Follow-up echo now shows severe aortic stenosis with aortic insufficiency.  Is hard to tell if she is having symptoms or not. She clearly is not having any chest pain or pressure, no syncope or near syncope. However she may be having dyspnea that is masked by the fact that she is not able to do much exercise.  We had a long talk as to what she would like to do going forward with her management. We discussed whether or not she would be interested in procedures or not. After long discussion with Daylani and her caregiver, we decided that reasonable course of action will be to refer her to see the cardiac surgeons in the Aortic Valve Clinic to discuss the possibility of aortic valve replacement via TAVR or open. I suspected given her age TAVR would be the plan, but not sure with aortic insufficiency with if this would be acceptable.  She is not convinced that she would be interested in going forward with procedures, but after our discussion, we felt that it is best for her to be able to make a decision with all the available information.  We discussed concerning symptoms, as much for the benefit of her caregiver as per Britta Mccreedy who will pay close attention to see if any concerning symptoms are occurring.  I will see her back once the decision is made.

## 2020-03-29 NOTE — Assessment & Plan Note (Signed)
Combination of aortic stenosis with insufficiency-have been noted on exam, but symptoms are very difficult to assess.  Plan is to refer to CVTS-Valve Clinic to discuss potential options of aortic valve placement. Based on this information, she can decide whether not she would want to proceed or simply treat expectantly.

## 2020-03-30 DIAGNOSIS — M25551 Pain in right hip: Secondary | ICD-10-CM | POA: Diagnosis not present

## 2020-03-30 DIAGNOSIS — R2689 Other abnormalities of gait and mobility: Secondary | ICD-10-CM | POA: Diagnosis not present

## 2020-03-30 DIAGNOSIS — R2681 Unsteadiness on feet: Secondary | ICD-10-CM | POA: Diagnosis not present

## 2020-03-30 DIAGNOSIS — M6281 Muscle weakness (generalized): Secondary | ICD-10-CM | POA: Diagnosis not present

## 2020-03-30 DIAGNOSIS — M8580 Other specified disorders of bone density and structure, unspecified site: Secondary | ICD-10-CM | POA: Diagnosis not present

## 2020-03-30 DIAGNOSIS — M545 Low back pain: Secondary | ICD-10-CM | POA: Diagnosis not present

## 2020-03-31 ENCOUNTER — Non-Acute Institutional Stay: Payer: Medicare PPO | Admitting: Internal Medicine

## 2020-03-31 ENCOUNTER — Other Ambulatory Visit: Payer: Self-pay

## 2020-03-31 ENCOUNTER — Encounter: Payer: Self-pay | Admitting: Internal Medicine

## 2020-03-31 VITALS — BP 110/56 | HR 83 | Temp 97.5°F | Ht 62.0 in | Wt 130.0 lb

## 2020-03-31 DIAGNOSIS — F339 Major depressive disorder, recurrent, unspecified: Secondary | ICD-10-CM

## 2020-03-31 DIAGNOSIS — I352 Nonrheumatic aortic (valve) stenosis with insufficiency: Secondary | ICD-10-CM

## 2020-03-31 DIAGNOSIS — E871 Hypo-osmolality and hyponatremia: Secondary | ICD-10-CM | POA: Diagnosis not present

## 2020-03-31 DIAGNOSIS — M25551 Pain in right hip: Secondary | ICD-10-CM

## 2020-03-31 DIAGNOSIS — G8929 Other chronic pain: Secondary | ICD-10-CM | POA: Diagnosis not present

## 2020-03-31 DIAGNOSIS — M810 Age-related osteoporosis without current pathological fracture: Secondary | ICD-10-CM

## 2020-03-31 DIAGNOSIS — M545 Low back pain: Secondary | ICD-10-CM

## 2020-03-31 DIAGNOSIS — R600 Localized edema: Secondary | ICD-10-CM

## 2020-03-31 DIAGNOSIS — I10 Essential (primary) hypertension: Secondary | ICD-10-CM | POA: Diagnosis not present

## 2020-03-31 NOTE — Progress Notes (Addendum)
Location:  [facility] Service:  Clinic (12)  Provider:   Code Status:  Goals of Care:  Advanced Directives 09/04/2018  Does Patient Have a Medical Advance Directive? Yes  Type of Estate agent of [place];Living will  Does patient want to make changes to medical advance directive? No - Patient declined  Copy of Healthcare Power of Attorney in Chart? No - copy requested     Chief Complaint  Patient presents with  . Medical Management of Chronic Issues    Patient returns to the clinic for her 4 month follow up and discuss MOST form.   Marland Kitchen Health Maintenance    TDAP, Dexa, PCV13, Influenza, Covid 19    HPI: Patient is a 86 y.o. female seen today for medical management of chronic diseases.    Active issues History of severe AF in the AR Was seen by Dr. Herbie Baltimore few days ago.  He had recommended to see CVTS.  Patient is little reluctant.   very anxious and wanted to talk about her options. She is going to go for her appointment but has not decided what she wants to do. Depression and anxiety Going through grief due to loss of her husband few months ago Chronic back pain Takes tramadol as needed. Also had a right hip pain.  Was seen by Ortho and had it injected. Unsteady gait Doing better with walker Still drives.  Is working with therapy right now HTN Doing well with her Meds Alternate Diarrhea and Constipation? IBS and Lactose Intolerant Was seen by GI Was Prescribed Amitiza Not taking it right now Says her symptoms are controlled  Past Medical History:  Diagnosis Date  . Arthritis   . COPD (chronic obstructive pulmonary disease) (HCC)   . Depression   . Diverticulosis   . GERD (gastroesophageal reflux disease)   . Hyperlipemia   . Hypertension   . Irregular heart beat 2014   PVCs  . Moderate to severe aortic insufficiency 07/2019   Echo (03/08/2020): Aortic stenosis is now severe with mean gradient 60 mmHg. Aortic  regurgitation is moderate to severe.  . Osteoporosis   . Severe aortic stenosis 06/11/2019   Echo 06/2019: EF 60-65%. Mild basal septal LVH, GR 2 DD. Mod LA dilation.- High LVEDP. Severe AI, Mod AS-peak gradient 43 mmHg, mean 28 mmHg;; 02/2020: Mean AoV Gradient 60 mmHg with Mod AI. EF 70-755.  Mod LVH. Mod LA dilation. Mod MR.    Past Surgical History:  Procedure Laterality Date  . ABDOMINAL HYSTERECTOMY     both appy and hyst done together  . APPENDECTOMY    . CATARACT EXTRACTION    . TRANSTHORACIC ECHOCARDIOGRAM  06/2019   EF 60-65%. Mild basal septal LVH, GR 2 DD. Mod LA dilation.- High LVEDP. Severe AI, Mod AS-peak gradient 43 mmHg, mean 28 mmHg..    Allergies  Allergen Reactions  . Fosamax [Alendronate Sodium]     Poor healing to mouth wound  . Augmentin [Amoxicillin-Pot Clavulanate] Nausea Only    DID THE REACTION INVOLVE: Swelling of the face/tongue/throat, SOB, or low BP? No Sudden or severe rash/hives, skin peeling, or the inside of the mouth or nose? No Did it require medical treatment? No When did it last happen? If all above answers are "NO", may proceed with cephalosporin use.   Marland Kitchen Hydrocodone Nausea And Vomiting  . Mobic [Meloxicam] Rash  . Sulfonamide Derivatives Rash    Outpatient Encounter Medications as of 03/31/2020  Medication Sig  .  acetaminophen (TYLENOL) 650 MG CR tablet Take 650 mg by mouth in the morning, at noon, and at bedtime.   Marland Kitchen amLODipine (NORVASC) 2.5 MG tablet TAKE 1 TABLET BY MOUTH EVERY DAY  . aspirin 81 MG tablet Take 81 mg by mouth daily.  . fluticasone (FLONASE) 50 MCG/ACT nasal spray Place 1 spray into both nostrils as needed.   . fluticasone furoate-vilanterol (BREO ELLIPTA) 100-25 MCG/INH AEPB Inhale 1 puff into the lungs daily.  Marland Kitchen lovastatin (MEVACOR) 40 MG tablet Take 40 mg by mouth daily.   . sertraline (ZOLOFT) 100 MG tablet Take 100 mg by mouth daily.    Marland Kitchen tiotropium (SPIRIVA) 18 MCG inhalation capsule Place 18 mcg into inhaler  and inhale daily.    . traMADol (ULTRAM) 50 MG tablet Take 1 tablet (50 mg total) by mouth every 6 (six) hours as needed for moderate pain.  . valsartan-hydrochlorothiazide (DIOVAN-HCT) 320-12.5 MG per tablet Take 1 tablet by mouth daily.    . Vitamin D, Ergocalciferol, (DRISDOL) 1.25 MG (50000 UT) CAPS capsule Take 50,000 Units by mouth every 7 (seven) days.   No facility-administered encounter medications on file as of 03/31/2020.    Review of Systems:  Review of Systems  Constitutional: Negative.   HENT: Negative.   Respiratory: Negative.   Cardiovascular: Negative.   Gastrointestinal: Negative.   Genitourinary: Negative.   Musculoskeletal: Positive for gait problem.  Neurological: Positive for weakness.  Psychiatric/Behavioral: Positive for dysphoric mood and sleep disturbance.  All other systems reviewed and are negative.   Health Maintenance  Topic Date Due  . DEXA SCAN  Never done  . PNA vac Low Risk Adult (2 of 2 - PCV13) 11/08/2014  . INFLUENZA VACCINE  03/28/2020  . TETANUS/TDAP  11/06/2022  . COVID-19 Vaccine  Completed    Physical Exam: Vitals:   03/31/20 1516  BP: (!) 110/56  Pulse: 83  Temp: (!) 97.5 F (36.4 C)  SpO2: 95%  Weight: 130 lb (59 kg)  Height: 5\' 2"  (1.575 m)   Body mass index is 23.78 kg/m. Physical Exam Vitals reviewed.  Constitutional:      Appearance: Normal appearance.  HENT:     Head: Normocephalic.     Right Ear: Tympanic membrane normal.     Left Ear: Tympanic membrane normal.     Nose: Nose normal.     Mouth/Throat:     Mouth: Mucous membranes are moist.     Pharynx: Oropharynx is clear.  Eyes:     Pupils: Pupils are equal, round, and reactive to light.  Cardiovascular:     Rate and Rhythm: Normal rate.     Heart sounds: Murmur heard.   Pulmonary:     Effort: Pulmonary effort is normal.     Breath sounds: Normal breath sounds.  Abdominal:     General: Abdomen is flat. Bowel sounds are normal.     Palpations: Abdomen  is soft.  Musculoskeletal:        General: No swelling.     Cervical back: Neck supple.  Skin:    General: Skin is warm.  Neurological:     General: No focal deficit present.     Mental Status: She is alert and oriented to person, place, and time.     Comments: Gait Slow but more stable  Psychiatric:     Comments: Anxious and Depressed     Labs reviewed: Basic Metabolic Panel: Recent Labs    05/08/19 0800 05/08/19 0800 08/06/19 1443 09/19/19 0853 03/17/20 03/19/20  NA 136   < > 140 137 138  K 3.7   < > 3.1* 4.1 3.7  CL 98   < > 97* 100 98  CO2 29   < > 28 28 29   GLUCOSE 101*   < > 103* 94 88  BUN 25   < > 21 25 18   CREATININE 0.99*   < > 0.89* 0.67 0.78  CALCIUM 9.0   < > 9.4 9.4 9.3  TSH 1.11  --   --   --   --    < > = values in this interval not displayed.   Liver Function Tests: Recent Labs    05/08/19 0800 03/17/20 0821  AST 18 15  ALT 11 9  BILITOT 0.5 0.5  PROT 6.3 6.6   No results for input(s): LIPASE, AMYLASE in the last 8760 hours. No results for input(s): AMMONIA in the last 8760 hours. CBC: Recent Labs    05/08/19 0800 03/17/20 0821  WBC 6.4 8.1  NEUTROABS 4,179 4,836  HGB 11.0* 11.8  HCT 32.4* 34.4*  MCV 87.6 90.8  PLT 302 304   Lipid Panel: Recent Labs    05/08/19 0800 03/17/20 0821  CHOL 175 175  HDL 65 73  LDLCALC 91 84  TRIG 101 89  CHOLHDL 2.7 2.4   No results found for: HGBA1C  Procedures since last visit: ECHOCARDIOGRAM COMPLETE  Result Date: 03/08/2020    ECHOCARDIOGRAM REPORT   Patient Name:   Robin [name] Date of Exam: 03/08/2020 Medical Rec #:  Francesco Sor       Height:       63.0 in Accession #:    05/09/2020      Weight:       129.0 lb Date of Birth:  11/10/1931       BSA:          1.605 m Patient Age:    87 years        BP:           136/64 mmHg Patient Gender: F               HR:           104 bpm. Exam Location:  [address] Procedure: 2D Echo, Cardiac Doppler, Color Doppler and Strain Analysis Indications:    I35  Aortic stenosis.  History:        Patient has prior history of Echocardiogram examinations, most                 recent 07/14/2019. COPD, Aortic Valve Disease, Arrythmias:PVC;                 Risk Factors:Hypertension. Edema.  Sonographer:    06/08/1932, RDCS Referring Phys: [phone] DAVID W HARDING IMPRESSIONS  1. Since the last study on 07/14/2019 aortic stenosis is now severe with mean transaortic gradient 60 mmHg. Aortic regurgitation is moderate to severe.  2. Left ventricular ejection fraction, by estimation, is 70 to 75%. The left ventricle has hyperdynamic function. The left ventricle has no regional wall motion abnormalities. There is moderate concentric left ventricular hypertrophy. Left ventricular diastolic parameters are consistent with Grade I diastolic dysfunction (impaired relaxation). The average left ventricular global longitudinal strain is -18.6 %. The global longitudinal strain is normal.  3. Right ventricular systolic function is normal. The right ventricular size is normal. There is moderately elevated pulmonary artery systolic pressure.  4. Left atrial size was moderately dilated.  5. The  mitral valve is normal in structure. Moderate mitral valve regurgitation. No evidence of mitral stenosis.  6. The aortic valve is normal in structure. Aortic valve regurgitation is moderate to severe. Severe aortic valve stenosis. Aortic valve mean gradient measures 60.0 mmHg.  7. The inferior vena cava is normal in size with greater than 50% respiratory variability, suggesting right atrial pressure of 3 mmHg. Comparison(s): 07/14/19 EF 60-65%. Moderate AS peak PG, mean PG, Severe AI. FINDINGS  Left Ventricle: Left ventricular ejection fraction, by estimation, is 70 to 75%. The left ventricle has hyperdynamic function. The left ventricle has no regional wall motion abnormalities. The average left ventricular global longitudinal strain is -18.6  %. The global longitudinal strain is normal. The  left ventricular internal cavity size was normal in size. There is moderate concentric left ventricular hypertrophy. Left ventricular diastolic parameters are consistent with Grade I diastolic dysfunction  (impaired relaxation). Normal left ventricular filling pressure. Right Ventricle: The right ventricular size is normal. No increase in right ventricular wall thickness. Right ventricular systolic function is normal. There is moderately elevated pulmonary artery systolic pressure. The tricuspid regurgitant velocity is 3.09 m/s, and with an assumed right atrial pressure of 8 mmHg, the estimated right ventricular systolic pressure is 46.2 mmHg. Left Atrium: Left atrial size was moderately dilated. Right Atrium: Right atrial size was normal in size. Pericardium: There is no evidence of pericardial effusion. Mitral Valve: The mitral valve is normal in structure. There is moderate thickening of the mitral valve leaflet(s). There is moderate calcification of the mitral valve leaflet(s). Normal mobility of the mitral valve leaflets. Moderate mitral annular calcification. Moderate mitral valve regurgitation. No evidence of mitral valve stenosis. Tricuspid Valve: The tricuspid valve is normal in structure. Tricuspid valve regurgitation is mild . No evidence of tricuspid stenosis. Aortic Valve: The aortic valve is normal in structure.. There is severe thickening and severe calcifcation of the aortic valve. Aortic valve regurgitation is moderate to severe. Aortic regurgitation PHT measures 238 msec. Severe aortic stenosis is present. There is severe thickening of the aortic valve. There is severe calcifcation of the aortic valve. Aortic valve mean gradient measures 60.0 mmHg. Aortic valve peak gradient measures 88.3 mmHg. Aortic valve area, by VTI measures 0.70 cm. Pulmonic Valve: The pulmonic valve was normal in structure. Pulmonic valve regurgitation is trivial. No evidence of pulmonic stenosis. Aorta: The aortic root is  normal in size and structure. Venous: The inferior vena cava is normal in size with greater than 50% respiratory variability, suggesting right atrial pressure of 3 mmHg. IAS/Shunts: No atrial level shunt detected by color flow Doppler.  LEFT VENTRICLE PLAX 2D LVIDd:         3.40 cm LVIDs:         2.20 cm  2D Longitudinal Strain LV PW:         1.40 cm  2D Strain GLS (A2C):   -17.3 % LV IVS:        1.60 cm  2D Strain GLS (A3C):   -21.9 % LVOT diam:     2.00 cm  2D Strain GLS (A4C):   -16.6 % LV SV:         73       2D Strain GLS Avg:     -18.6 % LV SV Index:   45 LVOT Area:     3.14 cm  RIGHT VENTRICLE RV Basal diam:  2.70 cm RV S prime:     12.90 cm/s TAPSE (M-mode): 2.2 cm  RVSP:           46.2 mmHg LEFT ATRIUM             Index       RIGHT ATRIUM           Index LA diam:        4.90 cm 3.05 cm/m  RA Pressure: 8.00 mmHg LA Vol (A2C):   46.4 ml 28.91 ml/m RA Area:     11.60 cm LA Vol (A4C):   55.4 ml 34.52 ml/m RA Volume:   24.90 ml  15.52 ml/m LA Biplane Vol: 52.1 ml 32.46 ml/m  AORTIC VALVE AV Area (Vmax):    0.80 cm AV Area (Vmean):   0.75 cm AV Area (VTI):     0.70 cm AV Vmax:           469.80 cm/s AV Vmean:          352.600 cm/s AV VTI:            1.035 m AV Peak Grad:      88.3 mmHg AV Mean Grad:      60.0 mmHg LVOT Vmax:         120.00 cm/s LVOT Vmean:        84.200 cm/s LVOT VTI:          0.232 m LVOT/AV VTI ratio: 0.22 AI PHT:            238 msec  AORTA Ao Root diam: 3.10 cm Ao Asc diam:  3.10 cm TRICUSPID VALVE TR Peak grad:   38.2 mmHg TR Vmax:        309.00 cm/s Estimated RAP:  8.00 mmHg RVSP:           46.2 mmHg  SHUNTS Systemic VTI:  0.23 m Systemic Diam: 2.00 cm Tobias AlexanderKatarina Nelson MD Electronically signed by Tobias AlexanderKatarina Nelson MD Signature Date/Time: 03/08/2020/5:52:31 PM    Final     Assessment/Plan  Essential hypertension Better today Diovan And Norvasc Bilateral leg edema PRN Lasix Osteoporosis, unspecified osteoporosis type, unspecified pathological fracture presence Gets Reclast from  her Rheumatologist Vit D level was good Nonrheumatic aortic insufficiency with aortic stenosis D/W her Options She is going to talk to CVTS  Wants to come and see me after the Appointment Depression, recurrent (HCC) Continue Zoloft  Chronic bilateral low back pain without sciatica Takes Tramadol PRn Right hip pain Seen Ortho and Had it Injected Working with Therapy COPD On Breo and Spiriva Alternate Diarrhea and Constipation Seen by GI On Amitiza but not taking it right now Hyponatremia - Has been stable Hyerlipidemia On Mevacor Continue the same Insomnia Will try Melatonin 2mg  PRN All Labs Reviewed with her Labs/tests ordered:  * No order type specified * Next appt:  Visit date not found

## 2020-04-01 ENCOUNTER — Telehealth: Payer: Self-pay

## 2020-04-01 NOTE — Telephone Encounter (Signed)
Add melatonin 2 mg qhs for  Insomnia.  DWP, no further questions.

## 2020-04-06 DIAGNOSIS — M545 Low back pain: Secondary | ICD-10-CM | POA: Diagnosis not present

## 2020-04-06 DIAGNOSIS — M25551 Pain in right hip: Secondary | ICD-10-CM | POA: Diagnosis not present

## 2020-04-06 DIAGNOSIS — M8580 Other specified disorders of bone density and structure, unspecified site: Secondary | ICD-10-CM | POA: Diagnosis not present

## 2020-04-06 DIAGNOSIS — M6281 Muscle weakness (generalized): Secondary | ICD-10-CM | POA: Diagnosis not present

## 2020-04-06 DIAGNOSIS — R2689 Other abnormalities of gait and mobility: Secondary | ICD-10-CM | POA: Diagnosis not present

## 2020-04-06 DIAGNOSIS — R2681 Unsteadiness on feet: Secondary | ICD-10-CM | POA: Diagnosis not present

## 2020-04-09 DIAGNOSIS — M545 Low back pain: Secondary | ICD-10-CM | POA: Diagnosis not present

## 2020-04-09 DIAGNOSIS — M8580 Other specified disorders of bone density and structure, unspecified site: Secondary | ICD-10-CM | POA: Diagnosis not present

## 2020-04-09 DIAGNOSIS — M25551 Pain in right hip: Secondary | ICD-10-CM | POA: Diagnosis not present

## 2020-04-09 DIAGNOSIS — R2681 Unsteadiness on feet: Secondary | ICD-10-CM | POA: Diagnosis not present

## 2020-04-09 DIAGNOSIS — R2689 Other abnormalities of gait and mobility: Secondary | ICD-10-CM | POA: Diagnosis not present

## 2020-04-09 DIAGNOSIS — M6281 Muscle weakness (generalized): Secondary | ICD-10-CM | POA: Diagnosis not present

## 2020-04-13 DIAGNOSIS — R2681 Unsteadiness on feet: Secondary | ICD-10-CM | POA: Diagnosis not present

## 2020-04-13 DIAGNOSIS — R2689 Other abnormalities of gait and mobility: Secondary | ICD-10-CM | POA: Diagnosis not present

## 2020-04-13 DIAGNOSIS — M25551 Pain in right hip: Secondary | ICD-10-CM | POA: Diagnosis not present

## 2020-04-13 DIAGNOSIS — M6281 Muscle weakness (generalized): Secondary | ICD-10-CM | POA: Diagnosis not present

## 2020-04-13 DIAGNOSIS — M8580 Other specified disorders of bone density and structure, unspecified site: Secondary | ICD-10-CM | POA: Diagnosis not present

## 2020-04-13 DIAGNOSIS — M545 Low back pain: Secondary | ICD-10-CM | POA: Diagnosis not present

## 2020-04-15 DIAGNOSIS — M8580 Other specified disorders of bone density and structure, unspecified site: Secondary | ICD-10-CM | POA: Diagnosis not present

## 2020-04-15 DIAGNOSIS — M545 Low back pain: Secondary | ICD-10-CM | POA: Diagnosis not present

## 2020-04-15 DIAGNOSIS — M25551 Pain in right hip: Secondary | ICD-10-CM | POA: Diagnosis not present

## 2020-04-15 DIAGNOSIS — R2681 Unsteadiness on feet: Secondary | ICD-10-CM | POA: Diagnosis not present

## 2020-04-15 DIAGNOSIS — R2689 Other abnormalities of gait and mobility: Secondary | ICD-10-CM | POA: Diagnosis not present

## 2020-04-15 DIAGNOSIS — M6281 Muscle weakness (generalized): Secondary | ICD-10-CM | POA: Diagnosis not present

## 2020-04-19 DIAGNOSIS — M25551 Pain in right hip: Secondary | ICD-10-CM | POA: Diagnosis not present

## 2020-04-19 DIAGNOSIS — R2681 Unsteadiness on feet: Secondary | ICD-10-CM | POA: Diagnosis not present

## 2020-04-19 DIAGNOSIS — M545 Low back pain: Secondary | ICD-10-CM | POA: Diagnosis not present

## 2020-04-19 DIAGNOSIS — M6281 Muscle weakness (generalized): Secondary | ICD-10-CM | POA: Diagnosis not present

## 2020-04-19 DIAGNOSIS — M8580 Other specified disorders of bone density and structure, unspecified site: Secondary | ICD-10-CM | POA: Diagnosis not present

## 2020-04-19 DIAGNOSIS — R2689 Other abnormalities of gait and mobility: Secondary | ICD-10-CM | POA: Diagnosis not present

## 2020-04-20 NOTE — Progress Notes (Signed)
[facility] Consult Note  Chief Complaint  Patient presents with  . New Patient (Initial Visit)    severe aortic stenosis   History of Present Illness: 86 yo female with history of arthritis, COPD, GERD, hyperlipidemia, HTN, PVCs, moderate aortic valve insufficiency and severe aortic stenosis who is here today as a new consult, referred by Dr. Herbie Baltimore for further discussion regarding her aortic stenosis and possible TAVR. She has been followed by Dr. Herbie Baltimore for moderate to severe AI and moderate aortic valve stenosis. Most recent echo July 2021 with LVEF=70-75%, moderate LVH. There is moderate mitral regurgitation. The aortic valve is thickened and calcified with limited leaflet excursion. Mean gradient 60 mmHg, peak gradient 88.3 mmHg, AVA 0.7 cm2. Dimensionless index 0.22. Aortic insufficiency is moderate to severe.   She tells me today that she has no dizziness. She has progressive fatigue and dyspnea but she has COPD and she has assumed that her dyspnea was from this. No chest pain. No lower extremity edema. She lives in [place] at [facility]. She has no active dental issues. She is a retired Engineer, site.   Primary Care Physician: Mahlon Gammon, MD Primary Cardiologist: Bryan Lemma Referring Cardiologist: Bryan Lemma  Past Medical History:  Diagnosis Date  . Arthritis   . COPD (chronic obstructive pulmonary disease) (HCC)   . Depression   . Diverticulosis   . GERD (gastroesophageal reflux disease)   . Hyperlipemia   . Hypertension   . Irregular heart beat 2014   PVCs  . Moderate to severe aortic insufficiency 07/2019   Echo (03/08/2020): Aortic stenosis is now severe with mean gradient 60 mmHg. Aortic regurgitation is moderate to severe.  . Osteoporosis   . Severe aortic stenosis 06/11/2019   Echo 06/2019: EF 60-65%. Mild basal septal LVH, GR 2 DD. Mod LA dilation.- High LVEDP. Severe AI, Mod AS-peak gradient 43 mmHg, mean 28 mmHg;; 02/2020: Mean  AoV Gradient 60 mmHg with Mod AI. EF 70-755.  Mod LVH. Mod LA dilation. Mod MR.    Past Surgical History:  Procedure Laterality Date  . ABDOMINAL HYSTERECTOMY     both appy and hyst done together  . APPENDECTOMY    . CATARACT EXTRACTION    . TRANSTHORACIC ECHOCARDIOGRAM  06/2019   EF 60-65%. Mild basal septal LVH, GR 2 DD. Mod LA dilation.- High LVEDP. Severe AI, Mod AS-peak gradient 43 mmHg, mean 28 mmHg..    Current Outpatient Medications  Medication Sig Dispense Refill  . acetaminophen (TYLENOL) 650 MG CR tablet Take 650 mg by mouth in the morning, at noon, and at bedtime.     Marland Kitchen amLODipine (NORVASC) 2.5 MG tablet TAKE 1 TABLET BY MOUTH EVERY DAY 90 tablet 1  . aspirin 81 MG tablet Take 81 mg by mouth daily.    . fluticasone (FLONASE) 50 MCG/ACT nasal spray Place 1 spray into both nostrils as needed.     . fluticasone furoate-vilanterol (BREO ELLIPTA) 100-25 MCG/INH AEPB Inhale 1 puff into the lungs daily.    Marland Kitchen lovastatin (MEVACOR) 40 MG tablet Take 40 mg by mouth daily.     Marland Kitchen MELATONIN PO Take 2 mg by mouth at bedtime.    . sertraline (ZOLOFT) 100 MG tablet Take 100 mg by mouth daily.      Marland Kitchen tiotropium (SPIRIVA) 18 MCG inhalation capsule Place 18 mcg into inhaler and inhale daily.      . traMADol (ULTRAM) 50 MG tablet Take 1 tablet (50 mg total) by mouth every  6 (six) hours as needed for moderate pain. 30 tablet 0  . valsartan-hydrochlorothiazide (DIOVAN-HCT) 320-25 MG tablet Take 1 tablet by mouth daily.    . Vitamin D, Ergocalciferol, (DRISDOL) 1.25 MG (50000 UT) CAPS capsule Take 50,000 Units by mouth every 7 (seven) days.     No current facility-administered medications for this visit.    Allergies  Allergen Reactions  . Fosamax [Alendronate Sodium]     Poor healing to mouth wound  . Augmentin [Amoxicillin-Pot Clavulanate] Nausea Only    DID THE REACTION INVOLVE: Swelling of the face/tongue/throat, SOB, or low BP? No Sudden or severe rash/hives, skin peeling, or the inside  of the mouth or nose? No Did it require medical treatment? No When did it last happen? If all above answers are "NO", may proceed with cephalosporin use.   Marland Kitchen Hydrocodone Nausea And Vomiting  . Mobic [Meloxicam] Rash  . Sulfonamide Derivatives Rash    Social History   Socioeconomic History  . Marital status: Married    Spouse name: Not on file  . Number of children: 2  . Years of education: Not on file  . Highest education level: Not on file  Occupational History  . Occupation: Event organiser    Comment: Teacher  Tobacco Use  . Smoking status: Former Smoker    Packs/day: 1.00    Years: 30.00    Pack years: 30.00    Types: Cigarettes    Quit date: 08/28/2008    Years since quitting: 11.6  . Smokeless tobacco: Never Used  Vaping Use  . Vaping Use: Never used  Substance and Sexual Activity  . Alcohol use: Yes    Alcohol/week: 14.0 standard drinks    Types: 14 Standard drinks or equivalent per week    Comment: 1-2 glass of wine or mixed drink daily  . Drug use: No  . Sexual activity: Not on file  Other Topics Concern  . Not on file  Social History Narrative   Currently remarried.  New husband has significant cardiac history.  (Followed by Dr. Herbie Baltimore)   She is a retired Runner, broadcasting/film/video.      She and her new husband [name]") moved to [facility] just prior to the outbreak of the pandemic in the Spring of 2020.       [name] - Arnisha Laffoon) died Feb 02, 2020 -> longstanding Parkinson's disease complicated by dementia.      She does have a pretty unsteady gait and has to either use a cane for short distances or uses a walker for longer distance. -->This is really because of her bad back and poor balance.  As result of this, she really is not all that active, and is very upset about not being on to do her rehab.   Social Determinants of Health   Financial Resource Strain:   . Difficulty of Paying Living Expenses: Not on file  Food Insecurity:   . Worried About Community education officer in the Last Year: Not on file  . Ran Out of Food in the Last Year: Not on file  Transportation Needs:   . Lack of Transportation (Medical): Not on file  . Lack of Transportation (Non-Medical): Not on file  Physical Activity:   . Days of Exercise per Week: Not on file  . Minutes of Exercise per Session: Not on file  Stress:   . Feeling of Stress : Not on file  Social Connections:   . Frequency of Communication with Friends and Family: Not on file  .  Frequency of Social Gatherings with Friends and Family: Not on file  . Attends Religious Services: Not on file  . Active Member of Clubs or Organizations: Not on file  . Attends Banker Meetings: Not on file  . Marital Status: Not on file  Intimate Partner Violence:   . Fear of Current or Ex-Partner: Not on file  . Emotionally Abused: Not on file  . Physically Abused: Not on file  . Sexually Abused: Not on file    Family History  Problem Relation Age of Onset  . Lung cancer Father   . Cancer Sister        biliary  . Hypertension Mother   . Cancer Sister     Review of Systems:  As stated in the HPI and otherwise negative.   BP 108/60   Pulse 92   Ht 5\' 2"  (1.575 m)   Wt 129 lb (58.5 kg)   BMI 23.59 kg/m   Physical Examination: General: Well developed, well nourished, NAD  HEENT: OP clear, mucus membranes moist  SKIN: warm, dry. No rashes. Neuro: No focal deficits  Musculoskeletal: Muscle strength 5/5 all ext  Psychiatric: Mood and affect normal  Neck: No JVD, no carotid bruits, no thyromegaly, no lymphadenopathy.  Lungs:Clear bilaterally, no wheezes, rhonci, crackles Cardiovascular: Regular rate and rhythm. Loud, harsh, late peaking systolic murmur.  Abdomen:Soft. Bowel sounds present. Non-tender.  Extremities: No lower extremity edema. Pulses are 2 + in the bilateral DP/PT.  EKG:  EKG is not ordered today. The ekg ordered today demonstrates   Echo 03/08/20: 1. Since the last study on  07/14/2019 aortic stenosis is now severe with  mean transaortic gradient 60 mmHg. Aortic regurgitation is moderate to  severe.  2. Left ventricular ejection fraction, by estimation, is 70 to 75%. The  left ventricle has hyperdynamic function. The left ventricle has no  regional wall motion abnormalities. There is moderate concentric left  ventricular hypertrophy. Left ventricular  diastolic parameters are consistent with Grade I diastolic dysfunction  (impaired relaxation). The average left ventricular global longitudinal  strain is -18.6 %. The global longitudinal strain is normal.  3. Right ventricular systolic function is normal. The right ventricular  size is normal. There is moderately elevated pulmonary artery systolic  pressure.  4. Left atrial size was moderately dilated.  5. The mitral valve is normal in structure. Moderate mitral valve  regurgitation. No evidence of mitral stenosis.  6. The aortic valve is normal in structure. Aortic valve regurgitation is  moderate to severe. Severe aortic valve stenosis. Aortic valve mean  gradient measures 60.0 mmHg.  7. The inferior vena cava is normal in size with greater than 50%  respiratory variability, suggesting right atrial pressure of 3 mmHg.   Comparison(s): 07/14/19 EF 60-65%. Moderate AS 07/16/19 peak PG, mean  PG, Severe AI.   FINDINGS  Left Ventricle: Left ventricular ejection fraction, by estimation, is 70  to 75%. The left ventricle has hyperdynamic function. The left ventricle  has no regional wall motion abnormalities. The average left ventricular  global longitudinal strain is -18.6  %. The global longitudinal strain is normal. The left ventricular  internal cavity size was normal in size. There is moderate concentric left  ventricular hypertrophy. Left ventricular diastolic parameters are  consistent with Grade I diastolic dysfunction  (impaired relaxation). Normal left ventricular filling pressure.    Right Ventricle: The right ventricular size is normal. No increase in  right ventricular wall thickness. Right ventricular systolic function  is  normal. There is moderately elevated pulmonary artery systolic pressure.  The tricuspid regurgitant velocity is  3.09 m/s, and with an assumed right atrial pressure of 8 mmHg, the  estimated right ventricular systolic pressure is 46.2 mmHg.   Left Atrium: Left atrial size was moderately dilated.   Right Atrium: Right atrial size was normal in size.   Pericardium: There is no evidence of pericardial effusion.   Mitral Valve: The mitral valve is normal in structure. There is moderate  thickening of the mitral valve leaflet(s). There is moderate calcification  of the mitral valve leaflet(s). Normal mobility of the mitral valve  leaflets. Moderate mitral annular  calcification. Moderate mitral valve regurgitation. No evidence of mitral  valve stenosis.   Tricuspid Valve: The tricuspid valve is normal in structure. Tricuspid  valve regurgitation is mild . No evidence of tricuspid stenosis.   Aortic Valve: The aortic valve is normal in structure.. There is severe  thickening and severe calcifcation of the aortic valve. Aortic valve  regurgitation is moderate to severe. Aortic regurgitation PHT measures 238  msec. Severe aortic stenosis is  present. There is severe thickening of the aortic valve. There is severe  calcifcation of the aortic valve. Aortic valve mean gradient measures 60.0  mmHg. Aortic valve peak gradient measures 88.3 mmHg. Aortic valve area, by  VTI measures 0.70 cm.   Pulmonic Valve: The pulmonic valve was normal in structure. Pulmonic valve  regurgitation is trivial. No evidence of pulmonic stenosis.   Aorta: The aortic root is normal in size and structure.   Venous: The inferior vena cava is normal in size with greater than 50%  respiratory variability, suggesting right atrial pressure of 3 mmHg.   IAS/Shunts: No  atrial level shunt detected by color flow Doppler.     LEFT VENTRICLE  PLAX 2D  LVIDd:     3.40 cm  LVIDs:     2.20 cm 2D Longitudinal Strain  LV PW:     1.40 cm 2D Strain GLS (A2C):  -17.3 %  LV IVS:    1.60 cm 2D Strain GLS (A3C):  -21.9 %  LVOT diam:   2.00 cm 2D Strain GLS (A4C):  -16.6 %  LV SV:     73    2D Strain GLS Avg:   -18.6 %  LV SV Index:  45  LVOT Area:   3.14 cm     RIGHT VENTRICLE  RV Basal diam: 2.70 cm  RV S prime:   12.90 cm/s  TAPSE (M-mode): 2.2 cm  RVSP:      46.2 mmHg   LEFT ATRIUM       Index    RIGHT ATRIUM      Index  LA diam:    4.90 cm 3.05 cm/m RA Pressure: 8.00 mmHg  LA Vol (A2C):  46.4 ml 28.91 ml/m RA Area:   11.60 cm  LA Vol (A4C):  55.4 ml 34.52 ml/m RA Volume:  24.90 ml 15.52 ml/m  LA Biplane Vol: 52.1 ml 32.46 ml/m  AORTIC VALVE  AV Area (Vmax):  0.80 cm  AV Area (Vmean):  0.75 cm  AV Area (VTI):   0.70 cm  AV Vmax:      469.80 cm/s  AV Vmean:     352.600 cm/s  AV VTI:      1.035 m  AV Peak Grad:   88.3 mmHg  AV Mean Grad:   60.0 mmHg  LVOT Vmax:     120.00 cm/s  LVOT Vmean:    84.200 cm/s  LVOT VTI:     0.232 m  LVOT/AV VTI ratio: 0.22  AI PHT:      238 msec    AORTA  Ao Root diam: 3.10 cm  Ao Asc diam: 3.10 cm   TRICUSPID VALVE  TR Peak grad:  38.2 mmHg  TR Vmax:    309.00 cm/s  Estimated RAP: 8.00 mmHg  RVSP:      46.2 mmHg    SHUNTS  Systemic VTI: 0.23 m  Systemic Diam: 2.00 cm   Recent Labs: 05/08/2019: TSH 1.11 03/17/2020: ALT 9; BUN 18; Creat 0.78; Hemoglobin 11.8; Platelets 304; Potassium 3.7; Sodium 138   Lipid Panel    Component Value Date/Time   CHOL 175 03/17/2020 0821   TRIG 89 03/17/2020 0821   HDL 73 03/17/2020 0821   CHOLHDL 2.4 03/17/2020 0821   LDLCALC 84 03/17/2020 0821     Wt Readings from Last 3 Encounters:  04/21/20 129 lb (58.5 kg)  03/31/20 130 lb  (59 kg)  03/26/20 129 lb 9.6 oz (58.8 kg)     Other studies Reviewed: Echo images, office notes. Severe AS, moderate AI, moderate MR  Assessment and Plan:   1. Severe Aortic Valve Stenosis: She has severe, stage D aortic valve stenosis with her primary symptoms being fatigue and dyspnea. I have personally reviewed the echo images. The aortic valve is thickened, calcified with limited leaflet mobility. I think she would benefit from AVR. Given advanced age, she is not a good candidate for conventional AVR by surgical approach. I think she may be a good candidate for TAVR.   STS Risk Score: Risk of Mortality: 2.986% Renal Failure: 1.780% Permanent Stroke: 1.669% Prolonged Ventilation: 9.384% DSW Infection: 0.098% Reoperation: 5.125% Morbidity or Mortality: 17.481% Short Length of Stay: 22.222% Long Length of Stay: 9.583%   I have reviewed the natural history of aortic stenosis with the patient and their family members  who are present today. We have discussed the limitations of medical therapy and the poor prognosis associated with symptomatic aortic stenosis. We have reviewed potential treatment options, including palliative medical therapy, conventional surgical aortic valve replacement, and transcatheter aortic valve replacement. We discussed treatment options in the context of the patient's specific comorbid medical conditions.   She is insure if she would wish to proceed with TAVR. She would like to consider the procedure over the next several weeks. Given the current situation with Covid at [facility], it will be best to delay any further workup for several weeks. We will will arrange a right and left heart catheterization at [facility] in several weeks if she decides to proceed. Risks and benefits of the cath procedure and the valve procedure are reviewed with the patient. After the cath, she will have a cardiac CT, CTA of the chest/abdomen and pelvis, carotid artery dopplers, PT assessment  and she will then be referred to see one of the CT surgeons on our TAVR team.      Current medicines are reviewed at length with the patient today.  The patient does not have concerns regarding medicines.  The following changes have been made:  no change  Labs/ tests ordered today include:  No orders of the defined types were placed in this encounter.    Disposition:   F/U with the valve team. We will call to arrange her cardiac cath in several weeks. I will plan to have Dr. Herbie Baltimore perform her cardiac cath.    Signed, Cristal Deer  Clifton James, MD 04/21/2020 2:36 PM    [facility] [address] [place], [place], [place]  [address] Phone: [phone]; Fax: [phone]

## 2020-04-21 ENCOUNTER — Encounter: Payer: Self-pay | Admitting: Cardiovascular Disease

## 2020-04-21 ENCOUNTER — Other Ambulatory Visit: Payer: Self-pay

## 2020-04-21 ENCOUNTER — Ambulatory Visit: Payer: Medicare PPO | Admitting: Cardiovascular Disease

## 2020-04-21 VITALS — BP 108/60 | HR 92 | Ht 62.0 in | Wt 129.0 lb

## 2020-04-21 DIAGNOSIS — I350 Nonrheumatic aortic (valve) stenosis: Secondary | ICD-10-CM | POA: Diagnosis not present

## 2020-04-21 NOTE — Patient Instructions (Signed)
Medication Instructions:  No changes *If you need a refill on your cardiac medications before your next appointment, please call your pharmacy*   Lab Work: none If you have labs (blood work) drawn today and your tests are completely normal, you will receive your results only by: Marland Kitchen MyChart Message (if you have MyChart) OR . A paper copy in the mail If you have any lab test that is abnormal or we need to change your treatment, we will call you to review the results.   Testing/Procedures: None today   Follow-Up: We will contact you to discuss follow up.  Other Instructions

## 2020-05-05 ENCOUNTER — Telehealth: Payer: Self-pay

## 2020-05-05 NOTE — Telephone Encounter (Signed)
  [facility]   MULTIDISCIPLINARY HEART VALVE TEAM   Robin [name] was evaluated by Dr Clifton James on 8/25 and at that time the pt requested to have some time to think about TAVR and whether she would like to proceed with evaluation.  I attempted to reach the pt today by phone and left her a voicemail to contact me to discuss her plans and see if she had any additional questions that our team could answer.

## 2020-05-17 NOTE — Telephone Encounter (Signed)
Attempted to reach the pt again today.  I left the pt a voicemail to contact me with an update on her plan in regards to evaluation for TAVR.

## 2020-05-20 NOTE — Telephone Encounter (Signed)
The pt returned my call and at this time she remains hesitant to schedule TAVR evaluation due to Covid numbers and being asymptomatic from AV disease.  I reviewed symptoms that the pt should monitor for and also discussed the testing for TAVR evaluation.  At this time the pt plans to follow-up with Dr Herbie Baltimore on 12/1 for her scheduled follow-up.  If the pt develops symptoms or has any additional questions then I have instructed her to contact me or [facility]. The pt agreed with plan and all questions were answered.

## 2020-05-20 NOTE — Telephone Encounter (Signed)
Thanks

## 2020-05-21 NOTE — Telephone Encounter (Signed)
Thanks for the update  dh

## 2020-06-08 ENCOUNTER — Other Ambulatory Visit: Payer: Self-pay | Admitting: Internal Medicine

## 2020-06-08 NOTE — Telephone Encounter (Signed)
Patient is requesting refill on medication "Sertraline 100mg " , and "Vitamin D". Last refill on Sertraline was 12/06/2010 & "Vitamin D" was last filled on 08/29/2018. Medications pend and sent to PCP 10/28/2018, MD Please Advise.

## 2020-06-30 ENCOUNTER — Encounter: Payer: Self-pay | Admitting: Internal Medicine

## 2020-06-30 ENCOUNTER — Non-Acute Institutional Stay: Payer: Medicare PPO | Admitting: Internal Medicine

## 2020-06-30 ENCOUNTER — Other Ambulatory Visit: Payer: Self-pay

## 2020-06-30 VITALS — BP 138/66 | HR 91 | Temp 96.4°F | Ht 62.0 in | Wt 132.0 lb

## 2020-06-30 DIAGNOSIS — I10 Essential (primary) hypertension: Secondary | ICD-10-CM

## 2020-06-30 DIAGNOSIS — M810 Age-related osteoporosis without current pathological fracture: Secondary | ICD-10-CM | POA: Diagnosis not present

## 2020-06-30 DIAGNOSIS — M545 Low back pain, unspecified: Secondary | ICD-10-CM | POA: Diagnosis not present

## 2020-06-30 DIAGNOSIS — F339 Major depressive disorder, recurrent, unspecified: Secondary | ICD-10-CM

## 2020-06-30 DIAGNOSIS — R600 Localized edema: Secondary | ICD-10-CM

## 2020-06-30 DIAGNOSIS — E871 Hypo-osmolality and hyponatremia: Secondary | ICD-10-CM

## 2020-06-30 DIAGNOSIS — G8929 Other chronic pain: Secondary | ICD-10-CM

## 2020-06-30 DIAGNOSIS — E7849 Other hyperlipidemia: Secondary | ICD-10-CM

## 2020-06-30 DIAGNOSIS — R194 Change in bowel habit: Secondary | ICD-10-CM | POA: Diagnosis not present

## 2020-06-30 DIAGNOSIS — I352 Nonrheumatic aortic (valve) stenosis with insufficiency: Secondary | ICD-10-CM

## 2020-06-30 DIAGNOSIS — M25551 Pain in right hip: Secondary | ICD-10-CM | POA: Diagnosis not present

## 2020-06-30 DIAGNOSIS — J410 Simple chronic bronchitis: Secondary | ICD-10-CM

## 2020-06-30 NOTE — Progress Notes (Signed)
Location:  [facility] Service:  Clinic (12)  Provider:   Code Status:  Goals of Care:  Advanced Directives 09/04/2018  Does Patient Have a Medical Advance Directive? Yes  Type of Estate agent of [place];Living will  Does patient want to make changes to medical advance directive? No - Patient declined  Copy of Healthcare Power of Attorney in Chart? No - copy requested     Chief Complaint  Patient presents with   Medical Management of Chronic Issues    Patient returns to the clinic for her 4 month follow up.    Health Maintenance    PCV 13 and zoster    HPI: Patient is a 86 y.o. female seen today for medical management of chronic diseases.    History of severe AF and AR Is getting follow-up with Dr. Herbie Baltimore.  Still not sure if she wants to go through TAVR Symptoms are dyspnea and fatigue.  Depression and anxiety Doing better.  Lost her husband 6 months ago months ago.  Is using melatonin to help her sleep. Chronic back pain stable with as needed tramadol Alternate diarrhea and constipation?  IBS Symptoms more controlled  Overall patient doing fairly well.  Walking with her walker.  Has a stepson in town.  Her son lives then [place]  Past Medical History:  Diagnosis Date   Arthritis    COPD (chronic obstructive pulmonary disease) (HCC)    Depression    Diverticulosis    GERD (gastroesophageal reflux disease)    Hyperlipemia    Hypertension    Irregular heart beat 2014   PVCs   Moderate to severe aortic insufficiency 07/2019   Echo (03/08/2020): Aortic stenosis is now severe with mean gradient 60 mmHg. Aortic regurgitation is moderate to severe.   Osteoporosis    Severe aortic stenosis 06/11/2019   Echo 06/2019: EF 60-65%. Mild basal septal LVH, GR 2 DD. Mod LA dilation.- High LVEDP. Severe AI, Mod AS-peak gradient 43 mmHg, mean 28 mmHg;; 02/2020: Mean AoV Gradient 60 mmHg with Mod AI. EF 70-755.  Mod LVH. Mod LA  dilation. Mod MR.    Past Surgical History:  Procedure Laterality Date   ABDOMINAL HYSTERECTOMY     both appy and hyst done together   APPENDECTOMY     CATARACT EXTRACTION     TRANSTHORACIC ECHOCARDIOGRAM  06/2019   EF 60-65%. Mild basal septal LVH, GR 2 DD. Mod LA dilation.- High LVEDP. Severe AI, Mod AS-peak gradient 43 mmHg, mean 28 mmHg..    Allergies  Allergen Reactions   Fosamax [Alendronate Sodium]     Poor healing to mouth wound   Augmentin [Amoxicillin-Pot Clavulanate] Nausea Only    DID THE REACTION INVOLVE: Swelling of the face/tongue/throat, SOB, or low BP? No Sudden or severe rash/hives, skin peeling, or the inside of the mouth or nose? No Did it require medical treatment? No When did it last happen? If all above answers are NO, may proceed with cephalosporin use.    Hydrocodone Nausea And Vomiting   Mobic [Meloxicam] Rash   Sulfonamide Derivatives Rash    Outpatient Encounter Medications as of 06/30/2020  Medication Sig   acetaminophen (TYLENOL) 650 MG CR tablet Take 650 mg by mouth in the morning, at noon, and at bedtime.    amLODipine (NORVASC) 2.5 MG tablet TAKE 1 TABLET BY MOUTH EVERY DAY   aspirin 81 MG tablet Take 81 mg by mouth daily.   fluticasone (FLONASE) 50 MCG/ACT nasal  spray Place 1 spray into both nostrils as needed.    fluticasone furoate-vilanterol (BREO ELLIPTA) 100-25 MCG/INH AEPB Inhale 1 puff into the lungs daily.   lovastatin (MEVACOR) 40 MG tablet Take 40 mg by mouth daily.    MELATONIN PO Take 2 mg by mouth at bedtime.   sertraline (ZOLOFT) 100 MG tablet TAKE 1 TABLET BY MOUTH EVERY DAY   tiotropium (SPIRIVA) 18 MCG inhalation capsule Place 18 mcg into inhaler and inhale daily.     traMADol (ULTRAM) 50 MG tablet Take 1 tablet (50 mg total) by mouth every 6 (six) hours as needed for moderate pain.   valsartan-hydrochlorothiazide (DIOVAN-HCT) 320-25 MG tablet Take 1 tablet by mouth daily.   Vitamin D,  Ergocalciferol, (DRISDOL) 1.25 MG (50000 UNIT) CAPS capsule TAKE 1 CAPSULE BY MOUTH ONCE A WEEK   No facility-administered encounter medications on file as of 06/30/2020.    Review of Systems:  Review of Systems  Review of Systems  Constitutional: Negative for activity change, appetite change, chills, diaphoresis, fatigue and fever.  HENT: Negative for mouth sores, postnasal drip, rhinorrhea, sinus pain and sore throat.   Respiratory: Negative for apnea, cough, chest tightness, shortness of breath and wheezing.   Cardiovascular: Negative for chest pain, palpitations and leg swelling.  Gastrointestinal: Negative for abdominal distention, abdominal pain, constipation, diarrhea, nausea and vomiting.  Genitourinary: Negative for dysuria and frequency.  Musculoskeletal: Negative for arthralgias, joint swelling and myalgias.  Skin: Negative for rash.  Neurological: Negative for dizziness, syncope, weakness, light-headedness and numbness.  Psychiatric/Behavioral: Negative for behavioral problems, confusion and sleep disturbance.     Health Maintenance  Topic Date Due   DEXA SCAN  Never done   PNA vac Low Risk Adult (2 of 2 - PCV13) 11/08/2014   TETANUS/TDAP  11/06/2022   INFLUENZA VACCINE  Completed   COVID-19 Vaccine  Completed    Physical Exam: Vitals:   06/30/20 1342  BP: 138/66  Pulse: 91  Temp: (!) 96.4 F (35.8 C)  SpO2: 99%  Weight: 132 lb (59.9 kg)  Height: 5\' 2"  (1.575 m)   Body mass index is 24.14 kg/m. Physical Exam Constitutional: Oriented to person, place, and time. Well-developed and well-nourished.  HENT:  Head: Normocephalic.  Mouth/Throat: Oropharynx is clear and moist.  Eyes: Pupils are equal, round, and reactive to light.  Neck: Neck supple.  Cardiovascular: Normal rate and normal heart sounds.  Murmur Present Pulmonary/Chest: Effort normal and breath sounds normal. No respiratory distress. Occassional Expiratory wheezing She has no rales.    Abdominal: Soft. Bowel sounds are normal. No distension. There is no tenderness. There is no rebound.  Musculoskeletal: Chronic Venous changes and Mild edema Lymphadenopathy: none Neurological: Alert and oriented to person, place, and time. Gait stable with Walker Skin: Skin is warm and dry.  Psychiatric: Normal mood and affect. Behavior is normal. Thought content normal.   Labs reviewed: Basic Metabolic Panel: Recent Labs    08/06/19 1443 09/19/19 0853 03/17/20 0821  NA 140 137 138  K 3.1* 4.1 3.7  CL 97* 100 98  CO2 28 28 29   GLUCOSE 103* 94 88  BUN 21 25 18   CREATININE 0.89* 0.67 0.78  CALCIUM 9.4 9.4 9.3   Liver Function Tests: Recent Labs    03/17/20 0821  AST 15  ALT 9  BILITOT 0.5  PROT 6.6   No results for input(s): LIPASE, AMYLASE in the last 8760 hours. No results for input(s): AMMONIA in the last 8760 hours. CBC: Recent Labs  03/17/20 0821  WBC 8.1  NEUTROABS 4,836  HGB 11.8  HCT 34.4*  MCV 90.8  PLT 304   Lipid Panel: Recent Labs    03/17/20 0821  CHOL 175  HDL 73  LDLCALC 84  TRIG 89  CHOLHDL 2.4   No results found for: HGBA1C  Procedures since last visit: No results found.  Assessment/Plan  Essential hypertension Continue Diovan and Norvasc Bilateral leg edema Does not use Lasix anymore Osteoporosis, unspecified osteoporosis type, unspecified pathological fracture presence It seems like patient has been on Reclast since 2013 by her primary care Will order DEXA scan.  Nonrheumatic aortic insufficiency with aortic stenosis Has a follow-up with the cardiology Still not sure if she wants to go for TAVR COPD Needs to continue her Breo and Spiriva Depression, recurrent (HCC) Doing well on Zoloft Hyponatremia Sodium seems stable Chronic bilateral low back pain without sciatica Uses tramadol as needed with Tylenol Right hip pain Having no issues Change in bowel habit Seems stable  Hyperlipidemia On  Mevacor Insomnia Doing well on melatonin  Labs/tests ordered:  * No order type specified * Next appt:  11/04/2020

## 2020-07-19 ENCOUNTER — Other Ambulatory Visit: Payer: Self-pay | Admitting: Internal Medicine

## 2020-07-27 ENCOUNTER — Other Ambulatory Visit: Payer: Self-pay | Admitting: Internal Medicine

## 2020-07-28 ENCOUNTER — Other Ambulatory Visit: Payer: Self-pay

## 2020-07-28 ENCOUNTER — Encounter: Payer: Self-pay | Admitting: Cardiology

## 2020-07-28 ENCOUNTER — Ambulatory Visit: Payer: Medicare PPO | Admitting: Cardiology

## 2020-07-28 VITALS — BP 160/72 | HR 85 | Ht 63.0 in | Wt 132.0 lb

## 2020-07-28 DIAGNOSIS — I350 Nonrheumatic aortic (valve) stenosis: Secondary | ICD-10-CM

## 2020-07-28 DIAGNOSIS — I10 Essential (primary) hypertension: Secondary | ICD-10-CM | POA: Diagnosis not present

## 2020-07-28 DIAGNOSIS — I493 Ventricular premature depolarization: Secondary | ICD-10-CM

## 2020-07-28 DIAGNOSIS — I351 Nonrheumatic aortic (valve) insufficiency: Secondary | ICD-10-CM

## 2020-07-28 NOTE — Patient Instructions (Signed)
Medication Instructions:  No changes  *If you need a refill on your cardiac medications before your next appointment, please call your pharmacy*   Lab Work: Not needed   Testing/Procedures: Will be schedule at [facility] [address]  Your physician has requested that you have an echocardiogram. Echocardiography is a painless test that uses sound waves to create images of your heart. It provides your doctor with information about the size and shape of your heart and how well your hearts chambers and valves are working. This procedure takes approximately one hour. There are no restrictions for this procedure.     Follow-Up: At [facility], you and your health needs are our priority.  As part of our continuing mission to provide you with exceptional heart care, we have created designated Provider Care Teams.  These Care Teams include your primary Cardiologist (physician) and Advanced Practice Providers (APPs -  Physician Assistants and Nurse Practitioners) who all work together to provide you with the care you need, when you need it.  We recommend signing up for the patient portal called "MyChart".  Sign up information is provided on this After Visit Summary.  MyChart is used to connect with patients for Virtual Visits (Telemedicine).  Patients are able to view lab/test results, encounter notes, upcoming appointments, etc.  Non-urgent messages can be sent to your provider as well.   To learn more about what you can do with MyChart, go to [email or web address].    Your next appointment:   3 month(s)  The format for your next appointment:    virtual or in person   Provider:   Bryan Lemma, MD   Other Instructions  watch for increase shortness of breath , chest pain , swelling contact office if symptoms increase.

## 2020-07-28 NOTE — Progress Notes (Signed)
Primary Care Provider: Mahlon Gammon, MD Cardiologist: Bryan Lemma, MD  --> Previously seen by Dr. Donnie Aho Electrophysiologist: None  Clinic Note: Chief Complaint  Patient presents with  . Follow-up    Stable  . Aortic Stenosis    Combination of aortic stenosis and regurgitation   HPI:    Robin [name] is a 86 y.o. female with SIGNIFICANT AORTIC INSUFFICIENCY/STENOSIS (Moderate to Severe AI/ Severe AS, along with moderate MR) who presents today for 29-month follow-up  Robin [name] was last seen on March 26, 2020 (accompanied by one of her caregivers from [facility], [place].  At that visit we discussed progression of her aortic valve disease -> from Moderate AS to Severe AS.  Notably more shaken after her husband's death over the preceding few months.  Was doing physical therapy for balance issues but not walking much.  Does use a walker and able to get back from to the cafeteria after a long walk.  No edema.  She does have an exercise intolerance/fatigue some exertional dyspnea walking.  She also has some balance issues and dizziness, but no syncope or near syncope.  After a long discussion, we decided for her to move forward with CVTS consultation in order to make more informed decision.  --> She was seen by Dr. Clifton James in the TAVR clinic on August 25.  She does note progressive fatigue and exertional dyspnea but also has COPD.  No chest pain or pressure.  She remained unsure if she will would like to proceed with TAVR.  He indicated that she wanted to reconsider.  Therefore cardiac catheterization and other preop evaluations were delayed.  He indicated that after cardiac catheterization was done, she would also need to have cardiac CT/CTA chest abdomen pelvis carotid artery Dopplers as well as PT evaluation.  This will be followed by CT surgical TAVR team evaluation.   Recent Hospitalizations: None  Reviewed  CV studies:    The following studies were reviewed today: (if  available, images/films reviewed: From Epic Chart or Care Everywhere) . Echo (03/08/2020): Aortic stenosis is now severe with mean gradient 60 mmHg. Aortic regurgitation is moderate to severe. Hyperdynamic LV with EF of 70 to 75%. No R WMA. Moderate concentric LVH. GR 1 DD. normal longitudinal strain. Moderate mitral regurgitation. Moderate LA dilation.Marland Kitchen o Comparison(s): 07/14/19 EF 60-65%. Moderate AS peak PG, mean PG, Severe AI.  Interval History:   Robin [name] is again accompanied by one of the caregivers from [facility].  She seems to be pretty much at her baseline.  She still notes that she tires easily.  She is not nearly as anxious as last visit.  She seems to be dealing with her husband's death better this visit than last -> she seems more talkative, less subdued.  Still has issues with balance and is trying to physical therapy.  She is limited by back pain, balance issues and baseline COPD.  As such, she has significant exercise intolerance.  She also has had issues with elevated blood pressure.  No chest pain pressure with rest or exertion.  She does have exertional dyspnea and exercise intolerance, this is not a notable change.  No rapid heartbeats palpitations.  No syncope or near syncope.  No PND, orthopnea or edema.  No TIA or amaurosis fugax.  Nothing to suggest arrhythmias, syncope or near syncope.  The patient Does Not have symptoms concerning for COVID-19 infection (fever, chills, cough, or new shortness of breath).  The patient is practicing social distancing & Masking.    REVIEWED OF SYSTEMS   Review of Systems  Constitutional: Positive for malaise/fatigue (Progressive exercise intolerance). Negative for weight loss.  HENT: Positive for hearing loss. Negative for nosebleeds.   Respiratory: Negative for cough and shortness of breath.   Gastrointestinal: Negative for blood in stool and melena.  Genitourinary: Negative for frequency.  Musculoskeletal:  Positive for back pain and joint pain (Hip). Negative for falls.       Very much limit activity. She is doing PT.  Neurological: Positive for dizziness and headaches. Negative for focal weakness, seizures and weakness.  Psychiatric/Behavioral: Positive for memory loss. Negative for depression (Mostly just grieving. Not true depression. Loss of her husband.). The patient is nervous/anxious (Notably anxious today. But anxiety has been a bigger issue since her husband died.) and has insomnia (She does have a hard time getting to sleep and staying asleep).    I have reviewed and (if needed) personally updated the patient's problem list, medications, allergies, past medical and surgical history, social and family history.   PAST MEDICAL HISTORY   Past Medical History:  Diagnosis Date  . Arthritis   . COPD (chronic obstructive pulmonary disease) (HCC)   . Depression   . Diverticulosis   . GERD (gastroesophageal reflux disease)   . Hyperlipemia   . Hypertension   . Irregular heart beat 2014   PVCs  . Moderate to severe aortic insufficiency 07/2019   Echo (03/08/2020): Aortic stenosis is now severe with mean gradient 60 mmHg. Aortic regurgitation is moderate to severe.  . Osteoporosis   . Severe aortic stenosis 06/11/2019   Echo 06/2019: EF 60-65%. Mild basal septal LVH, GR 2 DD. Mod LA dilation.- High LVEDP. Severe AI, Mod AS-peak gradient 43 mmHg, mean 28 mmHg;; 02/2020: Mean AoV Gradient 60 mmHg with Mod AI. EF 70-755.  Mod LVH. Mod LA dilation. Mod MR.    PAST SURGICAL HISTORY   Past Surgical History:  Procedure Laterality Date  . ABDOMINAL HYSTERECTOMY     both appy and hyst done together  . APPENDECTOMY    . CATARACT EXTRACTION    . TRANSTHORACIC ECHOCARDIOGRAM  06/2019   EF 60-65%. Mild basal septal LVH, GR 2 DD. Mod LA dilation.- High LVEDP. Severe AI, Mod AS-peak gradient 43 mmHg, mean 28 mmHg.Marland Kitchen    MEDICATIONS/ALLERGIES   Current Meds  Medication Sig  . acetaminophen  (TYLENOL) 650 MG CR tablet Take 650 mg by mouth in the morning, at noon, and at bedtime.   Marland Kitchen amLODipine (NORVASC) 2.5 MG tablet TAKE 1 TABLET BY MOUTH EVERY DAY  . aspirin 81 MG tablet Take 81 mg by mouth daily.  . fluticasone (FLONASE) 50 MCG/ACT nasal spray Place 1 spray into both nostrils as needed.   . fluticasone furoate-vilanterol (BREO ELLIPTA) 100-25 MCG/INH AEPB Inhale 1 puff into the lungs daily.  Marland Kitchen lovastatin (MEVACOR) 40 MG tablet TAKE 1 TABLET BY MOUTH EVERY DAY  . MELATONIN PO Take 2 mg by mouth at bedtime.  . sertraline (ZOLOFT) 100 MG tablet TAKE 1 TABLET BY MOUTH EVERY DAY  . SPIRIVA HANDIHALER 18 MCG inhalation capsule 1 CAPSULE BY INHALING THE CONTENTS OF THE CAPSULE USING THE HANDIHALER DEVICE ONCE A DAY INHALATION 90 DAYS  . traMADol (ULTRAM) 50 MG tablet Take 1 tablet (50 mg total) by mouth every 6 (six) hours as needed for moderate pain.  . valsartan-hydrochlorothiazide (DIOVAN-HCT) 320-25 MG tablet Take 1 tablet by mouth daily.  . Vitamin D,  Ergocalciferol, (DRISDOL) 1.25 MG (50000 UNIT) CAPS capsule TAKE 1 CAPSULE BY MOUTH ONE TIME PER WEEK    Allergies  Allergen Reactions  . Fosamax [Alendronate Sodium]     Poor healing to mouth wound  . Augmentin [Amoxicillin-Pot Clavulanate] Nausea Only    DID THE REACTION INVOLVE: Swelling of the face/tongue/throat, SOB, or low BP? No Sudden or severe rash/hives, skin peeling, or the inside of the mouth or nose? No Did it require medical treatment? No When did it last happen? If all above answers are "NO", may proceed with cephalosporin use.   Marland Kitchen. Hydrocodone Nausea And Vomiting  . Mobic [Meloxicam] Rash  . Sulfonamide Derivatives Rash    SOCIAL HISTORY/FAMILY HISTORY   Reviewed in Epic:  Pertinent findings: No new changes.  She is still doing physical therapy  OBJCTIVE -PE, EKG, labs   Wt Readings from Last 3 Encounters:  07/28/20 132 lb (59.9 kg)  06/30/20 132 lb (59.9 kg)  04/21/20 129 lb (58.5 kg)     Physical Exam: BP (!) 160/72   Pulse 85   Ht 5\' 3"  (1.6 m)   Wt 132 lb (59.9 kg)   BMI 23.38 kg/m  Physical Exam Constitutional:      General: She is not in acute distress.    Appearance: Normal appearance. She is normal weight.     Comments: Very anxious and fearful appearing; more frail appearing.  HENT:     Head: Normocephalic and atraumatic.  Neck:     Vascular: Decreased carotid pulses (Delayed upstroke with brisk upstroke-jackhammer pulse.). No carotid bruit (Exclude bruit, more likely radiating aortic stenosis murmur) or JVD.  Cardiovascular:     Rate and Rhythm: Normal rate and regular rhythm. Occasional extrasystoles are present.    Chest Wall: PMI is not displaced.     Pulses: Normal pulses.     Heart sounds: S1 normal and S2 normal. Heart sounds are distant. Murmur heard.  High-pitched harsh crescendo-decrescendo mid to late systolic murmur is present at the upper right sternal border radiating to the neck. High-pitched blowing decrescendo early diastolic murmur is present with a grade of 2/4 at the upper right sternal border radiating to the apex. No friction rub. No gallop.   Pulmonary:     Effort: Pulmonary effort is normal. No respiratory distress.     Breath sounds: Normal breath sounds. No rhonchi.  Chest:     Chest wall: No tenderness.  Musculoskeletal:        General: No swelling (Trivial). Normal range of motion.     Cervical back: Normal range of motion and neck supple.     Comments: Somewhat unsteady gait  Neurological:     General: No focal deficit present.     Mental Status: She is alert and oriented to person, place, and time. Mental status is at baseline.  Psychiatric:        Mood and Affect: Mood normal.        Behavior: Behavior normal.        Judgment: Judgment normal.     Comments: She is clearly anxious, and almost sad. Very concerned about findings on her echocardiogram. She did become sad when discussing her husband's passing     Adult  ECG Report n/a  Recent Labs:  n/a  Lab Results  Component Value Date   CHOL 175 03/17/2020   HDL 73 03/17/2020   LDLCALC 84 03/17/2020   TRIG 89 03/17/2020   CHOLHDL 2.4 03/17/2020   Lab Results  Component  Value Date   CREATININE 0.78 03/17/2020   BUN 18 03/17/2020   NA 138 03/17/2020   K 3.7 03/17/2020   CL 98 03/17/2020   CO2 29 03/17/2020   Lab Results  Component Value Date   TSH 1.11 05/08/2019    ASSESSMENT/PLAN   Jisel is still undecided about whether or not she wants to go forward with TAVR at this point.  I think after our conversation, she may be more declined to proceed.  I explained her that really with her lack of activity, it is difficult to tell if she is truly symptomatic.  I am concerned that she may be minimizing her exertional dyspnea and fatigue.  Problem List Items Addressed This Visit    Severe aortic stenosis by prior echocardiogram - Primary (Chronic)    EchoClear progression of disease from moderate-severe up to severe aortic stenosis.  It is difficult to tell if she is symptomatic simply because she is not doing very much in the way of activity.  She still tires easily and has shortness of breath.  But now her back has been hurting her as well. She is very unsure still about whether she should do anything about the valve.  She is fearful of all the Covid numbers in the hospital, and indicates that she still does not have symptoms.  We again discussed concerning symptoms of aortic stenosis, and the relatively urgent nature of proceeding with completing pre--TAVR evaluation if she were to start having symptoms.  Plan for now will be to reassess an echocardiogram in the beginning of next year.  Monitor for signs of angina, CHF or syncope/near syncope.      Relevant Orders   EKG 12-Lead (Completed)   ECHOCARDIOGRAM COMPLETE   Severe aortic insufficiency (Chronic)    Probably related to aortic stenosis.  Symptoms remain difficult to  assess.  CVTS-TAVR plans are in limbo until she decides whether she wants to proceed.      Relevant Orders   EKG 12-Lead (Completed)   ECHOCARDIOGRAM COMPLETE   Essential hypertension (Chronic)    She tells that her blood pressures are much better than this at home.  I am reluctant to be overly aggressive treating hypertension with concerns of possible orthostatic symptoms confusing potential AS symptoms      PVC's (premature ventricular contractions) (Chronic)    Not symptomatic.Avoiding beta-blocker because 1 major issues/symptoms was fatigue.      Relevant Orders   EKG 12-Lead (Completed)   ECHOCARDIOGRAM COMPLETE       COVID-19 Education: The signs and symptoms of COVID-19 were discussed with the patient and how to seek care for testing (follow up with PCP or arrange E-visit).   The importance of social distancing and COVID-19 vaccination was discussed today.  I spent a total of 35 minutes with the patient. >  50% of the time was spent in direct patient consultation.  Additional time spent with chart review  / charting (studies, outside notes, etc): 8 Total Time: 43 min   Current medicines are reviewed at length with the patient today.  (+/- concerns) n/a  Notice: This dictation was prepared with Dragon dictation along with smaller phrase technology. Any transcriptional errors that result from this process are unintentional and may not be corrected upon review.  Patient Instructions / Medication Changes & Studies & Tests Ordered   Patient Instructions  Medication Instructions:  No changes  *If you need a refill on your cardiac medications before your next appointment, please call  your pharmacy*   Lab Work: Not needed   Testing/Procedures: Will be schedule at [facility] [address]  Your physician has requested that you have an echocardiogram. Echocardiography is a painless test that uses sound waves to create images of your heart. It provides your  doctor with information about the size and shape of your heart and how well your heart's chambers and valves are working. This procedure takes approximately one hour. There are no restrictions for this procedure.     Follow-Up: At [facility], you and your health needs are our priority.  As part of our continuing mission to provide you with exceptional heart care, we have created designated Provider Care Teams.  These Care Teams include your primary Cardiologist (physician) and Advanced Practice Providers (APPs -  Physician Assistants and Nurse Practitioners) who all work together to provide you with the care you need, when you need it.  We recommend signing up for the patient portal called "MyChart".  Sign up information is provided on this After Visit Summary.  MyChart is used to connect with patients for Virtual Visits (Telemedicine).  Patients are able to view lab/test results, encounter notes, upcoming appointments, etc.  Non-urgent messages can be sent to your provider as well.   To learn more about what you can do with MyChart, go to [email or web address].    Your next appointment:   3 month(s)  The format for your next appointment:    virtual or in person   Provider:   Bryan Lemma, MD   Other Instructions  watch for increase shortness of breath , chest pain , swelling contact office if symptoms increase.  Studies Ordered:   Orders Placed This Encounter  Procedures  . EKG 12-Lead  . ECHOCARDIOGRAM COMPLETE     Bryan Lemma, M.D., M.S. Interventional Cardiologist   Pager # [phone] Phone # [phone] [address] [place], [place] [address]   Thank you for choosing Heart[facility] at [facility]!!

## 2020-08-07 ENCOUNTER — Encounter: Payer: Self-pay | Admitting: Cardiology

## 2020-08-07 NOTE — Assessment & Plan Note (Signed)
Not symptomatic.Avoiding beta-blocker because 1 major issues/symptoms was fatigue.

## 2020-08-07 NOTE — Assessment & Plan Note (Addendum)
EchoClear progression of disease from moderate-severe up to severe aortic stenosis.  It is difficult to tell if she is symptomatic simply because she is not doing very much in the way of activity.  She still tires easily and has shortness of breath.  But now her back has been hurting her as well. She is very unsure still about whether she should do anything about the valve.  She is fearful of all the Covid numbers in the hospital, and indicates that she still does not have symptoms.  We again discussed concerning symptoms of aortic stenosis, and the relatively urgent nature of proceeding with completing pre--TAVR evaluation if she were to start having symptoms.  Plan for now will be to reassess an echocardiogram in the beginning of next year.  Monitor for signs of angina, CHF or syncope/near syncope.

## 2020-08-07 NOTE — Assessment & Plan Note (Signed)
She tells that her blood pressures are much better than this at home.  I am reluctant to be overly aggressive treating hypertension with concerns of possible orthostatic symptoms confusing potential AS symptoms

## 2020-08-07 NOTE — Assessment & Plan Note (Signed)
Probably related to aortic stenosis.  Symptoms remain difficult to assess.  CVTS-TAVR plans are in limbo until she decides whether she wants to proceed.

## 2020-08-10 ENCOUNTER — Other Ambulatory Visit: Payer: Self-pay | Admitting: Internal Medicine

## 2020-08-16 ENCOUNTER — Other Ambulatory Visit: Payer: Self-pay | Admitting: Internal Medicine

## 2020-08-16 NOTE — Telephone Encounter (Signed)
Patient is requesting refill on medication "Valsartan 320-12.5mg ". Medication hasn't been filled by you before. Patient is taking "Valsartan 320-25 mg". I'm unsure which medication patient is taking. Medication pend and sent to PCP Mahlon Gammon, MD for approval and confirmation. Please Advise.

## 2020-08-27 ENCOUNTER — Other Ambulatory Visit: Payer: Self-pay | Admitting: Internal Medicine

## 2020-08-30 ENCOUNTER — Other Ambulatory Visit: Payer: Self-pay | Admitting: *Deleted

## 2020-08-30 MED ORDER — BREO ELLIPTA 100-25 MCG/INH IN AEPB: 1.0000 | INHALATION_SPRAY | Freq: Every day | RESPIRATORY_TRACT | 1 refills | Status: DC

## 2020-08-30 NOTE — Telephone Encounter (Signed)
Pharmacy requested refill

## 2020-09-02 ENCOUNTER — Other Ambulatory Visit: Payer: Self-pay | Admitting: Internal Medicine

## 2020-09-06 ENCOUNTER — Ambulatory Visit (HOSPITAL_COMMUNITY): Payer: Medicare PPO | Attending: Internal Medicine

## 2020-09-06 ENCOUNTER — Other Ambulatory Visit: Payer: Self-pay

## 2020-09-06 DIAGNOSIS — I493 Ventricular premature depolarization: Secondary | ICD-10-CM | POA: Diagnosis not present

## 2020-09-06 DIAGNOSIS — I350 Nonrheumatic aortic (valve) stenosis: Secondary | ICD-10-CM | POA: Insufficient documentation

## 2020-09-06 DIAGNOSIS — I351 Nonrheumatic aortic (valve) insufficiency: Secondary | ICD-10-CM | POA: Insufficient documentation

## 2020-09-06 HISTORY — PX: TRANSTHORACIC ECHOCARDIOGRAM: SHX275

## 2020-09-06 LAB — ECHOCARDIOGRAM COMPLETE
AR max vel: 1.19 cm2
AV Area VTI: 1.25 cm2
AV Area mean vel: 1.17 cm2
AV Mean grad: 33 mmHg
AV Peak grad: 48.7 mmHg
Ao pk vel: 3.49 m/s
Area-P 1/2: 6.02 cm2
P 1/2 time: 248 msec
S' Lateral: 2.7 cm

## 2020-09-09 ENCOUNTER — Ambulatory Visit: Payer: Medicare PPO | Admitting: Cardiovascular Disease

## 2020-09-09 ENCOUNTER — Encounter (INDEPENDENT_AMBULATORY_CARE_PROVIDER_SITE_OTHER): Payer: Self-pay

## 2020-09-09 ENCOUNTER — Telehealth: Payer: Self-pay | Admitting: *Deleted

## 2020-09-09 ENCOUNTER — Other Ambulatory Visit: Payer: Self-pay

## 2020-09-09 ENCOUNTER — Encounter: Payer: Self-pay | Admitting: Cardiovascular Disease

## 2020-09-09 VITALS — BP 152/66 | HR 79 | Ht 63.0 in | Wt 133.0 lb

## 2020-09-09 DIAGNOSIS — I350 Nonrheumatic aortic (valve) stenosis: Secondary | ICD-10-CM | POA: Diagnosis not present

## 2020-09-09 NOTE — Telephone Encounter (Signed)
-----   Message from Marykay Lex, MD sent at 09/08/2020 11:36 PM EST ----- Echocardiogram shows ejection fraction of 60 to 65% with no wall motion abnormalities.  The left ventricle is somewhat thickened but difficult to assess relaxation parameters, however there does appear to be left atrial dilation..  There does appear to be some elevated pressures in the right ventricle.  The mitral valve is disease and has mild to moderate regurgitation and mild to moderate stenosis.  The aortic valve has severe thickening with moderate to severe regurgitation and moderate to severe stenosis.  This represents a slight decrease in gradients from before-mean gradient is 33 mmHg with peak 49 mmHg.  Overall relatively stable.  Bryan Lemma, MD

## 2020-09-09 NOTE — Patient Instructions (Signed)
Medication Instructions:  Your physician recommends that you continue on your current medications as directed. Please refer to the Current Medication list given to you today.  *If you need a refill on your cardiac medications before your next appointment, please call your pharmacy*   Lab Work: none If you have labs (blood work) drawn today and your tests are completely normal, you will receive your results only by: Marland Kitchen MyChart Message (if you have MyChart) OR . A paper copy in the mail If you have any lab test that is abnormal or we need to change your treatment, we will call you to review the results.   Testing/Procedures: none   Follow-Up: At [facility], you and your health needs are our priority.  As part of our continuing mission to provide you with exceptional heart care, we have created designated Provider Care Teams.  These Care Teams include your primary Cardiologist (physician) and Advanced Practice Providers (APPs -  Physician Assistants and Nurse Practitioners) who all work together to provide you with the care you need, when you need it.  We recommend signing up for the patient portal called "MyChart".  Sign up information is provided on this After Visit Summary.  MyChart is used to connect with patients for Virtual Visits (Telemedicine).  Patients are able to view lab/test results, encounter notes, upcoming appointments, etc.  Non-urgent messages can be sent to your provider as well.   To learn more about what you can do with MyChart, go to [email or web address].    Follow up with Dr Herbie Baltimore as planned in March  Call Lauren if you would like to proceed with TAVR work up--[phone]

## 2020-09-09 NOTE — Telephone Encounter (Signed)
left message to call back for results --  Result stable will discuss at next appointment in 10/2020

## 2020-09-09 NOTE — Progress Notes (Signed)
Structural Heart Clinic Note  Chief Complaint  Patient presents with  . Follow-up    Aortic stenosis   History of Present Illness: 87 yo female with history of arthritis, COPD, GERD, hyperlipidemia, HTN, PVCs, moderate aortic valve insufficiency and severe aortic stenosis who is here today for follow up. She has been followed by Dr. Ellyn Hack for moderate to severe AI and moderate aortic valve stenosis. I saw her in August 2021 to discuss TAVR. Echo July 2021 with LVEF=70-75%, moderate LVH. Moderate mitral regurgitation. The aortic valve is thickened and calcified with limited leaflet excursion. Mean gradient 60 mmHg, peak gradient 88.3 mmHg, AVA 0.7 cm2. Dimensionless index 0.22. Aortic insufficiency is moderate to severe. When I met her in August 2021 she was feeling well overall with some dyspnea which she felt was due to her COPD and unchanged. We decided to follow her aortic stenosis. She was Dr. Ellyn Hack 07/28/20 and reported no change in her baseline dyspnea and fatigue. Echo January 2021 with LVEF=60-65%. Mild to moderate MR, Mild to moderate MS. The aortic valve leaflets are thickened and calcified with mean gradient 33 mmHg, peak gradient 48.7 mmHg, AVA 1.17 cm2, dimensionless index 0.40.  Of note, the gradients from the echo in July 2021 seem to be appropriate.   She is here today for follow up. The patient denies any chest pain, palpitations, lower extremity edema, orthopnea, PND, dizziness, near syncope or syncope. She has no change in her baseline dyspnea. She is here today with a caregiver from [facility] who notes she seems to be slowing down. She is not excited to have any procedures right now.   She lives in [place] at [facility]. She has no active dental issues. She is a retired Education officer, museum.   Primary Care Physician: Virgie Dad, MD Primary Cardiologist: Glenetta Hew Referring Cardiologist: Glenetta Hew  Past Medical History:  Diagnosis Date  .  Arthritis   . COPD (chronic obstructive pulmonary disease) ([place])   . Depression   . Diverticulosis   . GERD (gastroesophageal reflux disease)   . Hyperlipemia   . Hypertension   . Irregular heart beat 2014   PVCs  . Moderate to severe aortic insufficiency 07/2019   Echo (03/08/2020): Aortic stenosis is now severe with mean gradient 60 mmHg. Aortic regurgitation is moderate to severe.  . Osteoporosis   . Severe aortic stenosis 06/11/2019   Echo 06/2019: EF 60-65%. Mild basal septal LVH, GR 2 DD. Mod LA dilation.- High LVEDP. Severe AI, Mod AS-peak gradient 43 mmHg, mean 28 mmHg;; 02/2020: Mean AoV Gradient 60 mmHg with Mod AI. EF 70-755.  Mod LVH. Mod LA dilation. Mod MR.    Past Surgical History:  Procedure Laterality Date  . ABDOMINAL HYSTERECTOMY     both appy and hyst done together  . APPENDECTOMY    . CATARACT EXTRACTION    . TRANSTHORACIC ECHOCARDIOGRAM  06/2019   EF 60-65%. Mild basal septal LVH, GR 2 DD. Mod LA dilation.- High LVEDP. Severe AI, Mod AS-peak gradient 43 mmHg, mean 28 mmHg..    Current Outpatient Medications  Medication Sig Dispense Refill  . acetaminophen (TYLENOL) 650 MG CR tablet Take 650 mg by mouth in the morning, at noon, and at bedtime.     Marland Kitchen amLODipine (NORVASC) 2.5 MG tablet TAKE 1 TABLET BY MOUTH EVERY DAY 90 tablet 1  . aspirin 81 MG tablet Take 81 mg by mouth daily.    . fluticasone (FLONASE) 50 MCG/ACT nasal spray Place  1 spray into both nostrils as needed.     . fluticasone furoate-vilanterol (BREO ELLIPTA) 100-25 MCG/INH AEPB Inhale 1 puff into the lungs daily. 28 each 1  . lovastatin (MEVACOR) 40 MG tablet TAKE 1 TABLET BY MOUTH EVERY DAY 90 tablet 1  . MELATONIN PO Take 2 mg by mouth at bedtime.    . sertraline (ZOLOFT) 100 MG tablet TAKE 1 TABLET BY MOUTH EVERY DAY 90 tablet 1  . SPIRIVA HANDIHALER 18 MCG inhalation capsule 1 CAPSULE BY INHALING THE CONTENTS OF THE CAPSULE USING THE HANDIHALER DEVICE ONCE A DAY INHALATION 90 DAYS 90 capsule 3   . traMADol (ULTRAM) 50 MG tablet Take 1 tablet (50 mg total) by mouth every 6 (six) hours as needed for moderate pain. 30 tablet 0  . valsartan-hydrochlorothiazide (DIOVAN-HCT) 320-12.5 MG tablet TAKE 1 TABLET BY MOUTH EVERY DAY 90 tablet 0  . Vitamin D, Ergocalciferol, (DRISDOL) 1.25 MG (50000 UNIT) CAPS capsule TAKE 1 CAPSULE BY MOUTH ONE TIME PER WEEK 12 capsule 1   No current facility-administered medications for this visit.    Allergies  Allergen Reactions  . Fosamax [Alendronate Sodium]     Poor healing to mouth wound  . Augmentin [Amoxicillin-Pot Clavulanate] Nausea Only    DID THE REACTION INVOLVE: Swelling of the face/tongue/throat, SOB, or low BP? No Sudden or severe rash/hives, skin peeling, or the inside of the mouth or nose? No Did it require medical treatment? No When did it last happen? If all above answers are "NO", may proceed with cephalosporin use.   Marland Kitchen Hydrocodone Nausea And Vomiting  . Mobic [Meloxicam] Rash  . Sulfonamide Derivatives Rash    Social History   Socioeconomic History  . Marital status: Married    Spouse name: Not on file  . Number of children: 2  . Years of education: Not on file  . Highest education level: Not on file  Occupational History  . Occupation: Secondary school teacher    Comment: Teacher  Tobacco Use  . Smoking status: Former Smoker    Packs/day: 1.00    Years: 30.00    Pack years: 30.00    Types: Cigarettes    Quit date: 08/28/2008    Years since quitting: 12.0  . Smokeless tobacco: Never Used  Vaping Use  . Vaping Use: Never used  Substance and Sexual Activity  . Alcohol use: Yes    Alcohol/week: 14.0 standard drinks    Types: 14 Standard drinks or equivalent per week    Comment: 1-2 glass of wine or mixed drink daily  . Drug use: No  . Sexual activity: Not on file  Other Topics Concern  . Not on file  Social History Narrative   Currently remarried.  New husband has significant cardiac history.  (Followed by Dr.  Ellyn Hack)   She is a retired Pharmacist, hospital.      She and her new husband Obie Dredge") moved to [facility] just prior to the outbreak of the pandemic in the Spring of 2020.    Obie Dredge - Malky Rudzinski) died 01/17/20 -> longstanding Parkinson's disease complicated by dementia.      --> Now finally able to work with PT/OT to rehab - chronic back pain with very unsteady gait/poor balance. -- Uses cane for short distance - but mostly uses Walker.  Really only walks to & from the cafeteria if not working with therapy.She does have a pretty unsteady gait and has to either use a cane for short distances or uses a walker for  longer distance. -->This is really because of her bad back and poor balance.  As result of this, she really is not all that active, and is very upset about not being on to do her rehab.   Social Determinants of Health   Financial Resource Strain: Not on file  Food Insecurity: Not on file  Transportation Needs: Not on file  Physical Activity: Not on file  Stress: Not on file  Social Connections: Not on file  Intimate Partner Violence: Not on file    Family History  Problem Relation Age of Onset  . Lung cancer Father   . Cancer Sister        biliary  . Hypertension Mother   . Cancer Sister     Review of Systems:  As stated in the HPI and otherwise negative.   BP (!) 152/66   Pulse 79   Ht $R'5\' 3"'hO$  (1.6 m)   Wt 133 lb (60.3 kg)   SpO2 96%   BMI 23.56 kg/m   Physical Examination: General: Well developed, well nourished, NAD  HEENT: OP clear, mucus membranes moist  SKIN: warm, dry. No rashes. Neuro: No focal deficits  Musculoskeletal: Muscle strength 5/5 all ext  Psychiatric: Mood and affect normal  Neck: No JVD, no carotid bruits, no thyromegaly, no lymphadenopathy.  Lungs:Clear bilaterally, no wheezes, rhonci, crackles Cardiovascular: Regular rate and rhythm. Systolic murmur.  Abdomen:Soft. Bowel sounds present. Non-tender.  Extremities: No lower extremity edema.    EKG:  EKG is not ordered today. The ekg ordered today demonstrates   Echo January 2022: 1. Left ventricular ejection fraction, by estimation, is 60 to 65%. The  left ventricle has normal function. The left ventricle has no regional  wall motion abnormalities. There is moderate concentric left ventricular  hypertrophy of the basal-septal  segment. Indeterminate diastolic filling due to E-A fusion.  2. Right ventricular systolic function is normal. The right ventricular  size is normal. There is mildly elevated pulmonary artery systolic  pressure. The estimated right ventricular systolic pressure is 06.2 mmHg.  3. Left atrial size was moderately dilated.  4. The mitral valve is myxomatous. Mild to moderate mitral valve  regurgitation. Mild to moderate mitral stenosis. The mean mitral valve  gradient is 7.0 mmHg with average heart rate of 85 bpm.  5. Tricuspid valve regurgitation is mild to moderate.  6. The aortic valve is calcified. There is severe thickening of the  aortic valve. Aortic valve regurgitation is moderate to severe. Moderate  to severe aortic valve stenosis.  7. The inferior vena cava is normal in size with greater than 50%  respiratory variability, suggesting right atrial pressure of 3 mmHg.   Comparison(s): A prior study was performed on 03/08/2020. With decrease in  left ventricular function, slight decrease in aortic valve gradients.  Significant aortic regurgitation noted.   FINDINGS  Left Ventricle: Left ventricular ejection fraction, by estimation, is 60  to 65%. The left ventricle has normal function. The left ventricle has no  regional wall motion abnormalities. The left ventricular internal cavity  size was normal in size. There is  moderate concentric left ventricular hypertrophy of the basal-septal  segment. Indeterminate diastolic filling due to E-A fusion.   Right Ventricle: The right ventricular size is normal. No increase in  right  ventricular wall thickness. Right ventricular systolic function is  normal. There is mildly elevated pulmonary artery systolic pressure. The  tricuspid regurgitant velocity is 3.02  m/s, and with an assumed right atrial pressure of  3 mmHg, the estimated  right ventricular systolic pressure is 56.3 mmHg.   Left Atrium: Left atrial size was moderately dilated.   Right Atrium: Right atrial size was normal in size.   Pericardium: There is no evidence of pericardial effusion.   Mitral Valve: No systolic anterior motion of the mitral valve. The mitral  valve is myxomatous. Mild to moderate mitral valve regurgitation. Mild to  moderate mitral valve stenosis. MV peak gradient, 13.5 mmHg. The mean  mitral valve gradient is 7.0 mmHg  with average heart rate of 85 bpm.   Tricuspid Valve: The tricuspid valve is grossly normal. Tricuspid valve  regurgitation is mild to moderate.   Aortic Valve: The aortic valve is calcified. There is severe thickening of  the aortic valve. Aortic valve regurgitation is moderate to severe. Aortic  regurgitation PHT measures 248 msec. Moderate to severe aortic stenosis is  present. Aortic valve mean  gradient measures 33.0 mmHg. Aortic valve peak gradient measures 48.7  mmHg. Aortic valve area, by VTI measures 1.25 cm.   Pulmonic Valve: The pulmonic valve was normal in structure. Pulmonic valve  regurgitation is trivial.   Aorta: The aortic root and ascending aorta are structurally normal, with  no evidence of dilitation.   Venous: The inferior vena cava is normal in size with greater than 50%  respiratory variability, suggesting right atrial pressure of 3 mmHg.   IAS/Shunts: The atrial septum is grossly normal.     LEFT VENTRICLE  PLAX 2D  LVIDd:     3.60 cm Diastology  LVIDs:     2.70 cm LV e' medial:  8.38 cm/s  LV PW:     1.30 cm LV E/e' medial: 19.8  LV IVS:    1.50 cm LV e' lateral:  8.92 cm/s  LVOT diam:   2.00 cm  LV E/e' lateral: 18.6  LV SV:     108  LV SV Index:  66  LVOT Area:   3.14 cm     RIGHT VENTRICLE  RV Basal diam: 3.30 cm  RV S prime:   13.80 cm/s  TAPSE (M-mode): 2.1 cm  RVSP:      39.5 mmHg   LEFT ATRIUM       Index    RIGHT ATRIUM      Index  LA diam:    4.30 cm 2.65 cm/m RA Pressure: 3.00 mmHg  LA Vol (A2C):  67.9 ml 41.90 ml/m RA Area:   14.90 cm  LA Vol (A4C):  60.6 ml 37.39 ml/m RA Volume:  35.50 ml 21.91 ml/m  LA Biplane Vol: 66.7 ml 41.16 ml/m  AORTIC VALVE  AV Area (Vmax):  1.19 cm  AV Area (Vmean):  1.17 cm  AV Area (VTI):   1.25 cm  AV Vmax:      349.00 cm/s  AV Vmean:     276.000 cm/s  AV VTI:      0.865 m  AV Peak Grad:   48.7 mmHg  AV Mean Grad:   33.0 mmHg  LVOT Vmax:     132.00 cm/s  LVOT Vmean:    103.000 cm/s  LVOT VTI:     0.343 m  LVOT/AV VTI ratio: 0.40  AI PHT:      248 msec    AORTA  Ao Root diam: 3.00 cm  Ao Asc diam: 3.10 cm   MITRAL VALVE        TRICUSPID VALVE  MV Area (PHT): 6.02 cm   TR Peak grad:  36.5 mmHg  MV Peak grad: 13.5 mmHg  TR Vmax:    302.00 cm/s  MV Mean grad: 7.0 mmHg   Estimated RAP: 3.00 mmHg  MV Vmax:    1.84 m/s   RVSP:      39.5 mmHg  MV Vmean:   125.0 cm/s  MV Decel Time: 126 msec   SHUNTS  MV E velocity: 166.00 cm/s Systemic VTI: 0.34 m  MV A velocity: 171.00 cm/s Systemic Diam: 2.00 cm  MV E/A ratio: 0.97   Recent Labs: 03/17/2020: ALT 9; BUN 18; Creat 0.78; Hemoglobin 11.8; Platelets 304; Potassium 3.7; Sodium 138   Lipid Panel    Component Value Date/Time   CHOL 175 03/17/2020 0821   TRIG 89 03/17/2020 0821   HDL 73 03/17/2020 0821   CHOLHDL 2.4 03/17/2020 0821   LDLCALC 84 03/17/2020 0821     Wt Readings from Last 3 Encounters:  09/09/20 133 lb (60.3 kg)  07/28/20 132 lb (59.9 kg)  06/30/20 132 lb (59.9 kg)    Assessment and Plan:   1. Severe Aortic Valve  Stenosis: She clearly has severe aortic stenosis. Her echo last year had a mean gradient of 60 mmHg and most recent echo with mean gradient of 33 mmHg with subtle drop in LVEF. She continues to have fatigue and dyspnea but this has not changed. I have personally reviewed the echo images. The aortic valve is thickened, calcified with limited leaflet mobility. Despite the lower gradient on her most recent echo, her AS is severe. I think this is her window for TAVR. Given advanced age, she is not a good candidate for conventional AVR by surgical approach. I think she may be a good candidate for TAVR.   STS Risk Score: Risk of Mortality: 2.986% Renal Failure: 1.780% Permanent Stroke: 1.669% Prolonged Ventilation: 9.384% DSW Infection: 0.098% Reoperation: 5.125% Morbidity or Mortality: 17.481% Short Length of Stay: 22.222% Long Length of Stay: 9.583%   I have reviewed the natural history of aortic stenosis with the patient and their family members  who are present today. We have discussed the limitations of medical therapy and the poor prognosis associated with symptomatic aortic stenosis. We have reviewed potential treatment options, including palliative medical therapy, conventional surgical aortic valve replacement, and transcatheter aortic valve replacement. We discussed treatment options in the context of the patient's specific comorbid medical conditions.   She has seen to real change in her symptoms but the echo findings from July 2021 suggest severe/critical AS. The gradient is lower now, perhaps from the subtle fall in LVEF. She is still not sure that she wants to proceed with TAVR. She would like to consider the procedure over the next several weeks. Given the current situation with Covid at [facility], it will be best to delay any further workup for several weeks. We will will arrange a right and left heart catheterization at [facility] in several weeks if she decides to proceed. Risks and  benefits of the cath procedure and the valve procedure are reviewed with the patient. After the cath, she will have a cardiac CT, CTA of the chest/abdomen and pelvis, carotid artery dopplers, PT assessment and she will then be referred to see one of the CT surgeons on our TAVR team. I have urged her to consider moving forward with the workup.      Current medicines are reviewed at length with the patient today.  The patient does not have concerns regarding medicines.  The following changes have been made:  no change  Labs/ tests ordered today include:  No orders of the defined types were placed in this encounter.    Disposition:   F/U with the valve team. She will call us back to arrange the cardiac cath. I will plan to have Dr. Ellyn Hack perform her cardiac cath if his schedule allows.     Signed, Lauree Chandler, MD 09/09/2020 2:43 PM    [address], [place], [place]  [address] Phone: [phone]; Fax: [phone]

## 2020-09-10 DIAGNOSIS — L814 Other melanin hyperpigmentation: Secondary | ICD-10-CM | POA: Diagnosis not present

## 2020-09-10 DIAGNOSIS — D1801 Hemangioma of skin and subcutaneous tissue: Secondary | ICD-10-CM | POA: Diagnosis not present

## 2020-09-10 DIAGNOSIS — L821 Other seborrheic keratosis: Secondary | ICD-10-CM | POA: Diagnosis not present

## 2020-09-10 DIAGNOSIS — R208 Other disturbances of skin sensation: Secondary | ICD-10-CM | POA: Diagnosis not present

## 2020-09-23 ENCOUNTER — Other Ambulatory Visit: Payer: Self-pay

## 2020-09-23 ENCOUNTER — Telehealth: Payer: Self-pay

## 2020-09-23 ENCOUNTER — Encounter: Payer: Self-pay | Admitting: Nurse Practitioner

## 2020-09-23 ENCOUNTER — Ambulatory Visit (INDEPENDENT_AMBULATORY_CARE_PROVIDER_SITE_OTHER): Payer: Medicare PPO | Admitting: Nurse Practitioner

## 2020-09-23 DIAGNOSIS — Z0000 Encounter for general adult medical examination without abnormal findings: Secondary | ICD-10-CM | POA: Diagnosis not present

## 2020-09-23 DIAGNOSIS — E2839 Other primary ovarian failure: Secondary | ICD-10-CM | POA: Diagnosis not present

## 2020-09-23 NOTE — Progress Notes (Signed)
Subjective:   Francesco SorBarbara M Hardman is a 87 y.o. female who presents for Medicare Annual (Subsequent) preventive examination.  Review of Systems     Cardiac Risk Factors include: advanced age (>1655men, 35>65 women);sedentary lifestyle;hypertension;family history of premature cardiovascular disease     Objective:    There were no vitals filed for this visit. There is no height or weight on file to calculate BMI.  Advanced Directives 09/23/2020 09/04/2018 09/04/2018 09/04/2018 08/29/2018  Does Patient Have a Medical Advance Directive? Yes Yes Yes No Yes  Type of Advance Directive Out of facility DNR (pink MOST or yellow form) Healthcare Power of [place];Living will Healthcare Power of [place];Living will - Living will;Healthcare Power of Attorney  Does patient want to make changes to medical advance directive? No - Patient declined No - Patient declined No - Patient declined - No - Patient declined  Copy of Healthcare Power of Attorney in Chart? - No - copy requested No - copy requested - No - copy requested  Pre-existing out of facility DNR order (yellow form or pink MOST form) Pink MOST form placed in chart (order not valid for inpatient use) - - - -    Current Medications (verified) Outpatient Encounter Medications as of 09/23/2020  Medication Sig  . acetaminophen (TYLENOL) 650 MG CR tablet Take 650 mg by mouth in the morning, at noon, and at bedtime.   Marland Kitchen. amLODipine (NORVASC) 2.5 MG tablet TAKE 1 TABLET BY MOUTH EVERY DAY  . aspirin 81 MG tablet Take 81 mg by mouth daily.  . fluticasone (FLONASE) 50 MCG/ACT nasal spray Place 1 spray into both nostrils as needed.   . fluticasone furoate-vilanterol (BREO ELLIPTA) 100-25 MCG/INH AEPB Inhale 1 puff into the lungs daily.  Marland Kitchen. lovastatin (MEVACOR) 40 MG tablet TAKE 1 TABLET BY MOUTH EVERY DAY  . MELATONIN PO Take 2 mg by mouth at bedtime.  . sertraline (ZOLOFT) 100 MG tablet TAKE 1 TABLET BY MOUTH EVERY DAY  . SPIRIVA HANDIHALER 18 MCG inhalation capsule  1 CAPSULE BY INHALING THE CONTENTS OF THE CAPSULE USING THE HANDIHALER DEVICE ONCE A DAY INHALATION 90 DAYS  . traMADol (ULTRAM) 50 MG tablet Take 1 tablet (50 mg total) by mouth every 6 (six) hours as needed for moderate pain.  . valsartan-hydrochlorothiazide (DIOVAN-HCT) 320-12.5 MG tablet TAKE 1 TABLET BY MOUTH EVERY DAY  . Vitamin D, Ergocalciferol, (DRISDOL) 1.25 MG (50000 UNIT) CAPS capsule TAKE 1 CAPSULE BY MOUTH ONE TIME PER WEEK   No facility-administered encounter medications on file as of 09/23/2020.    Allergies (verified) Fosamax [alendronate sodium], Augmentin [amoxicillin-pot clavulanate], Hydrocodone, Mobic [meloxicam], and Sulfonamide derivatives   History: Past Medical History:  Diagnosis Date  . Arthritis   . COPD (chronic obstructive pulmonary disease) (HCC)   . Depression   . Diverticulosis   . GERD (gastroesophageal reflux disease)   . Hyperlipemia   . Hypertension   . Irregular heart beat 2014   PVCs  . Moderate to severe aortic insufficiency 07/2019   Echo (03/08/2020): Aortic stenosis is now severe with mean gradient 60 mmHg. Aortic regurgitation is moderate to severe.  . Osteoporosis   . Severe aortic stenosis 06/11/2019   Echo 06/2019: EF 60-65%. Mild basal septal LVH, GR 2 DD. Mod LA dilation.- High LVEDP. Severe AI, Mod AS-peak gradient 43 mmHg, mean 28 mmHg;; 02/2020: Mean AoV Gradient 60 mmHg with Mod AI. EF 70-755.  Mod LVH. Mod LA dilation. Mod MR.   Past Surgical History:  Procedure Laterality Date  .  ABDOMINAL HYSTERECTOMY     both appy and hyst done together  . APPENDECTOMY    . CATARACT EXTRACTION    . TRANSTHORACIC ECHOCARDIOGRAM  06/2019   EF 60-65%. Mild basal septal LVH, GR 2 DD. Mod LA dilation.- High LVEDP. Severe AI, Mod AS-peak gradient 43 mmHg, mean 28 mmHg..   Family History  Problem Relation Age of Onset  . Lung cancer Father   . Cancer Sister        biliary  . Hypertension Mother   . Cancer Sister    Social History    Socioeconomic History  . Marital status: Married    Spouse name: Not on file  . Number of children: 2  . Years of education: Not on file  . Highest education level: Not on file  Occupational History  . Occupation: Event organiser    Comment: Teacher  Tobacco Use  . Smoking status: Former Smoker    Packs/day: 1.00    Years: 30.00    Pack years: 30.00    Types: Cigarettes    Quit date: 08/28/2008    Years since quitting: 12.0  . Smokeless tobacco: Never Used  Vaping Use  . Vaping Use: Never used  Substance and Sexual Activity  . Alcohol use: Yes    Alcohol/week: 14.0 standard drinks    Types: 14 Standard drinks or equivalent per week    Comment: 1-2 glass of wine or mixed drink daily  . Drug use: No  . Sexual activity: Not on file  Other Topics Concern  . Not on file  Social History Narrative   Currently remarried.  New husband has significant cardiac history.  (Followed by Dr. Herbie Baltimore)   She is a retired Runner, broadcasting/film/video.      She and her new husband [name]") moved to [facility] just prior to the outbreak of the pandemic in the Spring of 2020.    [name] - Melika Reder) died 2020/01/29 -> longstanding Parkinson's disease complicated by dementia.      --> Now finally able to work with PT/OT to rehab - chronic back pain with very unsteady gait/poor balance. -- Uses cane for short distance - but mostly uses Walker.  Really only walks to & from the cafeteria if not working with therapy.She does have a pretty unsteady gait and has to either use a cane for short distances or uses a walker for longer distance. -->This is really because of her bad back and poor balance.  As result of this, she really is not all that active, and is very upset about not being on to do her rehab.   Social Determinants of Health   Financial Resource Strain: Not on file  Food Insecurity: Not on file  Transportation Needs: Not on file  Physical Activity: Not on file  Stress: Not on file  Social  Connections: Not on file    Tobacco Counseling Counseling given: Not Answered   Clinical Intake:  Pre-visit preparation completed: Yes  Pain : No/denies pain     BMI - recorded: 23 Nutritional Status: BMI of 19-24  Normal Diabetes: No  How often do you need to have someone help you when you read instructions, pamphlets, or other written materials from your doctor or pharmacy?: 1 - Never  Diabetic?no         Activities of Daily Living In your present state of health, do you have any difficulty performing the following activities: 09/23/2020  Hearing? N  Vision? N  Difficulty concentrating or making  decisions? N  Walking or climbing stairs? N  Dressing or bathing? N  Doing errands, shopping? Y  Comment caretaker helps  Preparing Food and eating ? N  Using the Toilet? N  In the past six months, have you accidently leaked urine? N  Do you have problems with loss of bowel control? Y  Managing your Medications? N  Managing your Finances? N  Housekeeping or managing your Housekeeping? N  Some recent data might be hidden    Patient Care Team: Mahlon Gammon, MD as PCP - General (Internal Medicine) Marykay Lex, MD as PCP - Cardiology (Cardiology)  Indicate any recent Medical Services you may have received from other than [facility] providers in the past year (date may be approximate).     Assessment:   This is a routine wellness examination for [place].  Hearing/Vision screen  Hearing Screening   125Hz  250Hz  500Hz  1000Hz  2000Hz  3000Hz  4000Hz  6000Hz  8000Hz   Right ear:           Left ear:           Comments: Patient has no hearing problems  Vision Screening Comments: Patient wears reading glasses. Patient has not had recent eye exam  Dietary issues and exercise activities discussed: Current Exercise Habits: The patient does not participate in regular exercise at present  Goals    . Patient Stated     In 09/23/20 goal is that she would like to have more energy        Depression Screen PHQ 2/9 Scores 09/23/2020  PHQ - 2 Score 0  Exception Documentation (No Data)    Fall Risk Fall Risk  09/23/2020 06/30/2020 03/31/2020 11/26/2019 07/23/2019  Falls in the past year? 0 0 0 1 0  Number falls in past yr: 0 - 0 1 0  Injury with Fall? 0 - - 0 -    FALL RISK PREVENTION PERTAINING TO THE HOME:  Any stairs in or around the home? No  If so, are there any without handrails? No  Home free of loose throw rugs in walkways, pet beds, electrical cords, etc? Yes  Adequate lighting in your home to reduce risk of falls? Yes   ASSISTIVE DEVICES UTILIZED TO PREVENT FALLS:  Life alert? No  Use of a cane, walker or w/c? Yes  Grab bars in the bathroom? Yes  Shower chair or bench in shower? Yes  Elevated toilet seat or a handicapped toilet? Yes   TIMED UP AND GO:  Was the test performed? No .    Cognitive Function:     6CIT Screen 09/23/2020  What Year? 0 points  What month? 0 points  What time? 0 points  Count back from 20 0 points  Months in reverse 0 points  Repeat phrase 2 points  Total Score 2    Immunizations Immunization History  Administered Date(s) Administered  . Influenza, High Dose Seasonal PF 06/11/2019  . Influenza-Unspecified 05/18/2020  . Moderna Sars-Covid-2 Vaccination 09/01/2019, 09/29/2019, 07/12/2020  . Pneumococcal Polysaccharide-23 11/07/2013  . Tdap 11/05/2012    TDAP status: Up to date  Flu Vaccine status: Up to date  Pneumococcal vaccine status: Due, Education has been provided regarding the importance of this vaccine. Advised may receive this vaccine at local pharmacy or Health Dept. Aware to provide a copy of the vaccination record if obtained from local pharmacy or Health Dept. Verbalized acceptance and understanding.  Covid-19 vaccine status: Completed vaccines  Qualifies for Shingles Vaccine? Yes   Zostavax completed No  Shingrix Completed?: No.    Education has been provided regarding the importance of  this vaccine. Patient has been advised to call insurance company to determine out of pocket expense if they have not yet received this vaccine. Advised may also receive vaccine at local pharmacy or Health Dept. Verbalized acceptance and understanding.  Screening Tests Health Maintenance  Topic Date Due  . DEXA SCAN  Never done  . PNA vac Low Risk Adult (2 of 2 - PCV13) 11/08/2014  . TETANUS/TDAP  11/06/2022  . INFLUENZA VACCINE  Completed  . COVID-19 Vaccine  Completed    Health Maintenance  Health Maintenance Due  Topic Date Due  . DEXA SCAN  Never done  . PNA vac Low Risk Adult (2 of 2 - PCV13) 11/08/2014    Colorectal cancer screening: No longer required.   Mammogram status: No longer required due to age.  Bone Density status: Ordered today. Pt provided with contact info and advised to call to schedule appt.  Lung Cancer Screening: (Low Dose CT Chest recommended if Age 56-80 years, 30 pack-year currently smoking OR have quit w/in 15years.) does not qualify.   Additional Screening:  Hepatitis C Screening: does not qualify;   Vision Screening: Recommended annual ophthalmology exams for early detection of glaucoma and other disorders of the eye. Is the patient up to date with their annual eye exam?  Yes  Who is the provider or what is the name of the office in which the patient attends annual eye exams? [facility] If pt is not established with a provider, would they like to be referred to a provider to establish care? No .   Dental Screening: Recommended annual dental exams for proper oral hygiene  Community Resource Referral / Chronic Care Management: CRR required this visit?  no  CCM required this visit?  no     Plan:     I have personally reviewed and noted the following in the patient's chart:   . Medical and social history . Use of alcohol, tobacco or illicit drugs  . Current medications and supplements . Functional ability and status . Nutritional  status . Physical activity . Advanced directives . List of other physicians . Hospitalizations, surgeries, and ER visits in previous 12 months . Vitals . Screenings to include cognitive, depression, and falls . Referrals and appointments  In addition, I have reviewed and discussed with patient certain preventive protocols, quality metrics, and best practice recommendations. A written personalized care plan for preventive services as well as general preventive health recommendations were provided to patient.     Sharon Seller, NP   09/23/2020    Virtual Visit via Telephone Note  I connected with@ on 09/23/20 at  3:15 PM EST by telephone and verified that I am speaking with the correct person using two identifiers.  Location: Patient: home Provider: [facility]    I discussed the limitations, risks, security and privacy concerns of performing an evaluation and management service by telephone and the availability of in person appointments. I also discussed with the patient that there may be a patient responsible charge related to this service. The patient expressed understanding and agreed to proceed.   I discussed the assessment and treatment plan with the patient. The patient was provided an opportunity to ask questions and all were answered. The patient agreed with the plan and demonstrated an understanding of the instructions.   The patient was advised to call back or seek an in-person evaluation if the symptoms  worsen or if the condition fails to improve as anticipated.  I provided 17 minutes of non-face-to-face time during this encounter.  Janene Harvey. Biag[employer] printed and mailed

## 2020-09-23 NOTE — Telephone Encounter (Signed)
  [facility]   MULTIDISCIPLINARY HEART VALVE TEAM  I left the pt a voicemail on her mobile number to contact me to discuss scheduling cardiac catheterization to further assess Aortic Valve. Dr Clifton James discussed plan at 1/13 office visit and the pt wished to think about proceeding with evaluation at that time.

## 2020-09-23 NOTE — Patient Instructions (Signed)
Ms. Robin [name] , Thank you for taking time to come for your Medicare Wellness Visit. I appreciate your ongoing commitment to your health goals. Please review the following plan we discussed and let me know if I can assist you in the future.   Screening recommendations/referrals: Colonoscopy aged out Mammogram aged out Bone Density ordered today- to call [phone] Recommended yearly ophthalmology/optometry visit for glaucoma screening and checkup Recommended yearly dental visit for hygiene and checkup  Vaccinations: Influenza vaccine up to date Pneumococcal vaccine due for prevnar 13- can get at your local pharamcy Tdap vaccine up to date Shingles vaccine RECOMMENDED- to get at your local pharmacy     Advanced directives: on file.   Conditions/risks identified: progressive weakness, advanced age.   Next appointment: yearly.    Preventive Care 56 Years and Older, Female Preventive care refers to lifestyle choices and visits with your health care provider that can promote health and wellness. What does preventive care include?  A yearly physical exam. This is also called an annual well check.  Dental exams once or twice a year.  Routine eye exams. Ask your health care provider how often you should have your eyes checked.  Personal lifestyle choices, including:  Daily care of your teeth and gums.  Regular physical activity.  Eating a healthy diet.  Avoiding tobacco and drug use.  Limiting alcohol use.  Practicing safe sex.  Taking low-dose aspirin every day.  Taking vitamin and mineral supplements as recommended by your health care provider. What happens during an annual well check? The services and screenings done by your health care provider during your annual well check will depend on your age, overall health, lifestyle risk factors, and family history of disease. Counseling  Your health care provider may ask you questions about your:  Alcohol use.  Tobacco  use.  Drug use.  Emotional well-being.  Home and relationship well-being.  Sexual activity.  Eating habits.  History of falls.  Memory and ability to understand (cognition).  Work and work Astronomer.  Reproductive health. Screening  You may have the following tests or measurements:  Height, weight, and BMI.  Blood pressure.  Lipid and cholesterol levels. These may be checked every 5 years, or more frequently if you are over 45 years old.  Skin check.  Lung cancer screening. You may have this screening every year starting at age 59 if you have a 30-pack-year history of smoking and currently smoke or have quit within the past 15 years.  Fecal occult blood test (FOBT) of the stool. You may have this test every year starting at age 84.  Flexible sigmoidoscopy or colonoscopy. You may have a sigmoidoscopy every 5 years or a colonoscopy every 10 years starting at age 51.  Hepatitis C blood test.  Hepatitis B blood test.  Sexually transmitted disease (STD) testing.  Diabetes screening. This is done by checking your blood sugar (glucose) after you have not eaten for a while (fasting). You may have this done every 1-3 years.  Bone density scan. This is done to screen for osteoporosis. You may have this done starting at age 41.  Mammogram. This may be done every 1-2 years. Talk to your health care provider about how often you should have regular mammograms. Talk with your health care provider about your test results, treatment options, and if necessary, the need for more tests. Vaccines  Your health care provider may recommend certain vaccines, such as:  Influenza vaccine. This is recommended every year.  Tetanus,  diphtheria, and acellular pertussis (Tdap, Td) vaccine. You may need a Td booster every 10 years.  Zoster vaccine. You may need this after age 18.  Pneumococcal 13-valent conjugate (PCV13) vaccine. One dose is recommended after age 85.  Pneumococcal  polysaccharide (PPSV23) vaccine. One dose is recommended after age 36. Talk to your health care provider about which screenings and vaccines you need and how often you need them. This information is not intended to replace advice given to you by your health care provider. Make sure you discuss any questions you have with your health care provider. Document Released: 09/10/2015 Document Revised: 05/03/2016 Document Reviewed: 06/15/2015 Elsevier Interactive Patient Education  2017 [employer] Prevention in the Home Falls can cause injuries. They can happen to people of all ages. There are many things you can do to make your home safe and to help prevent falls. What can I do on the outside of my home?  Regularly fix the edges of walkways and driveways and fix any cracks.  Remove anything that might make you trip as you walk through a door, such as a raised step or threshold.  Trim any bushes or trees on the path to your home.  Use bright outdoor lighting.  Clear any walking paths of anything that might make someone trip, such as rocks or tools.  Regularly check to see if handrails are loose or broken. Make sure that both sides of any steps have handrails.  Any raised decks and porches should have guardrails on the edges.  Have any leaves, snow, or ice cleared regularly.  Use sand or salt on walking paths during winter.  Clean up any spills in your garage right away. This includes oil or grease spills. What can I do in the bathroom?  Use night lights.  Install grab bars by the toilet and in the tub and shower. Do not use towel bars as grab bars.  Use non-skid mats or decals in the tub or shower.  If you need to sit down in the shower, use a plastic, non-slip stool.  Keep the floor dry. Clean up any water that spills on the floor as soon as it happens.  Remove soap buildup in the tub or shower regularly.  Attach bath mats securely with double-sided non-slip rug  tape.  Do not have throw rugs and other things on the floor that can make you trip. What can I do in the bedroom?  Use night lights.  Make sure that you have a light by your bed that is easy to reach.  Do not use any sheets or blankets that are too big for your bed. They should not hang down onto the floor.  Have a firm chair that has side arms. You can use this for support while you get dressed.  Do not have throw rugs and other things on the floor that can make you trip. What can I do in the kitchen?  Clean up any spills right away.  Avoid walking on wet floors.  Keep items that you use a lot in easy-to-reach places.  If you need to reach something above you, use a strong step stool that has a grab bar.  Keep electrical cords out of the way.  Do not use floor polish or wax that makes floors slippery. If you must use wax, use non-skid floor wax.  Do not have throw rugs and other things on the floor that can make you trip. What can I do with  my stairs?  Do not leave any items on the stairs.  Make sure that there are handrails on both sides of the stairs and use them. Fix handrails that are broken or loose. Make sure that handrails are as long as the stairways.  Check any carpeting to make sure that it is firmly attached to the stairs. Fix any carpet that is loose or worn.  Avoid having throw rugs at the top or bottom of the stairs. If you do have throw rugs, attach them to the floor with carpet tape.  Make sure that you have a light switch at the top of the stairs and the bottom of the stairs. If you do not have them, ask someone to add them for you. What else can I do to help prevent falls?  Wear shoes that:  Do not have high heels.  Have rubber bottoms.  Are comfortable and fit you well.  Are closed at the toe. Do not wear sandals.  If you use a stepladder:  Make sure that it is fully opened. Do not climb a closed stepladder.  Make sure that both sides of the  stepladder are locked into place.  Ask someone to hold it for you, if possible.  Clearly mark and make sure that you can see:  Any grab bars or handrails.  First and last steps.  Where the edge of each step is.  Use tools that help you move around (mobility aids) if they are needed. These include:  Canes.  Walkers.  Scooters.  Crutches.  Turn on the lights when you go into a dark area. Replace any light bulbs as soon as they burn out.  Set up your furniture so you have a clear path. Avoid moving your furniture around.  If any of your floors are uneven, fix them.  If there are any pets around you, be aware of where they are.  Review your medicines with your doctor. Some medicines can make you feel dizzy. This can increase your chance of falling. Ask your doctor what other things that you can do to help prevent falls. This information is not intended to replace advice given to you by your health care provider. Make sure you discuss any questions you have with your health care provider. Document Released: 06/10/2009 Document Revised: 01/20/2016 Document Reviewed: 09/18/2014 Elsevier Interactive Patient Education  2017 [employer].

## 2020-09-23 NOTE — Telephone Encounter (Signed)
[name], lauf are scheduled for a virtual visit with your provider today.    Just as we do with appointments in the office, we must obtain your consent to participate.  Your consent will be active for this visit and any virtual visit you may have with one of our providers in the next 365 days.    If you have a MyChart account, I can also send a copy of this consent to you electronically.  All virtual visits are billed to your insurance company just like a traditional visit in the office.  As this is a virtual visit, video technology does not allow for your provider to perform a traditional examination.  This may limit your provider's ability to fully assess your condition.  If your provider identifies any concerns that need to be evaluated in person or the need to arrange testing such as labs, EKG, etc, we will make arrangements to do so.    Although advances in technology are sophisticated, we cannot ensure that it will always work on either your end or our end.  If the connection with a video visit is poor, we may have to switch to a telephone visit.  With either a video or telephone visit, we are not always able to ensure that we have a secure connection.   I need to obtain your verbal consent now.   Are you willing to proceed with your visit today?   Robin [name] has provided verbal consent on 09/23/2020 for a virtual visit (video or telephone).   Natashia Roseman, CMA 09/23/2020  4:01 PM

## 2020-09-23 NOTE — Progress Notes (Signed)
This service is provided via telemedicine  No vital signs collected/recorded due to the encounter was a telemedicine visit.   Location of patient (ex: home, work):  Home  Patient consents to a telephone visit:  Yes, see encounter dated 09/23/2020  Location of the provider (ex: office, home):  [facility]  Name of any referring provider: Gupta, Anjali, MD  Names of all persons participating in the telemedicine service and their role in the encounter:  Jessica Eubanks, Nurse Practitioner, Tressy Kunzman Goodchild, CMA, and patient.   Time spent on call: 12 minutes with medical assistant 

## 2020-10-07 NOTE — Telephone Encounter (Signed)
  [facility]   MULTIDISCIPLINARY HEART VALVE TEAM  I attempted to reach the pt at home number but no answer. I attempted to reach the pt at mobile number and was sent to voicemail.  I left the pt a message to contact me if she would like to discuss proceeding with cardiac catheterization.  The pt has a pending appointment with Dr Herbie Baltimore on 11/01/20.

## 2020-10-26 ENCOUNTER — Other Ambulatory Visit: Payer: Self-pay | Admitting: Internal Medicine

## 2020-11-01 ENCOUNTER — Encounter: Payer: Self-pay | Admitting: Cardiology

## 2020-11-01 ENCOUNTER — Ambulatory Visit: Payer: Medicare PPO | Admitting: Cardiology

## 2020-11-01 ENCOUNTER — Other Ambulatory Visit: Payer: Self-pay

## 2020-11-01 VITALS — BP 125/67 | HR 77 | Ht 62.0 in | Wt 132.0 lb

## 2020-11-01 DIAGNOSIS — I350 Nonrheumatic aortic (valve) stenosis: Secondary | ICD-10-CM | POA: Diagnosis not present

## 2020-11-01 DIAGNOSIS — I340 Nonrheumatic mitral (valve) insufficiency: Secondary | ICD-10-CM

## 2020-11-01 DIAGNOSIS — I493 Ventricular premature depolarization: Secondary | ICD-10-CM

## 2020-11-01 DIAGNOSIS — I10 Essential (primary) hypertension: Secondary | ICD-10-CM

## 2020-11-01 DIAGNOSIS — I351 Nonrheumatic aortic (valve) insufficiency: Secondary | ICD-10-CM | POA: Diagnosis not present

## 2020-11-01 NOTE — Telephone Encounter (Signed)
Lauren, I just saw Robin [name] today in clinic.  I think at this time she is thinking at this time that but she does not want to proceed with all the invasive evaluation required for TAVR.  She is not very symptomatic, troubled more by her back and hip pain.  We talked for long time.   Bryan Lemma, MD

## 2020-11-01 NOTE — Progress Notes (Signed)
Primary Care Provider: Mahlon Gammon, MD Cardiologist: Bryan Lemma, MD  TAVR Cardiologist: Dr. Clifton James Electrophysiologist: None  Clinic Note: Chief Complaint  Patient presents with  . Aortic Stenosis    She has decided that she does not want to go through further procedures such as TAVR.  Marland Kitchen Follow-up    Discussed follow-up plans    ===================================  ASSESSMENT/PLAN   Problem List Items Addressed This Visit    Severe aortic stenosis by prior echocardiogram - Primary (Chronic)    Interesting that her stenosis is right is less than severe now.  Now moderate to severe.  EF had has gone down a little as well.  She is relatively asymptomatic, limited more by arthritis pains.  Now that she has made a decision that we are not going to go through with any further testing toward TAVR, we can stop checking surveillance echoes.  Continue to monitor for symptoms and work on try to reduce risk factors. Plan: Continue blood pressure control with amlodipine and valsartan-HCTZ. Continue lovastatin. Continue aspirin.      Relevant Orders   EKG 12-Lead (Completed)   Severe aortic insufficiency (Chronic)    Again now moderate to severe insufficiency.  Not overly symptomatic.  Does not want TAVR.  Therefore would not pursue further evaluation. Exam monitor for symptoms.  Try to avoid hypotension.  For now not requiring any more diuretic than the 12.5 mg HCTZ.      Essential hypertension (Chronic)    Blood pressure looks pretty well controlled on current meds-low-dose amlodipine along with high-dose valsartan and HCTZ.      PVC's (premature ventricular contractions) (Chronic)    Asymptomatic.  She did not tolerate beta-blocker because of fatigue.  Will hold off for now.      Relevant Orders   EKG 12-Lead (Completed)   Mitral valve insufficiency (Chronic)    Myxomatous mitral valve noted.  No real prolapse.  Now moderate stenosis and mild to moderate  regurgitation.  The presence of mitral valvular disease along with aortic valve disease would suggest that this is probably potentially rheumatic in nature, but we cannot be sure.  I also think that having mitral disease would make intervening on the aortic valve somewhat incomplete as far as treatment goes.  She does not want to pursue TAVR and therefore not want to pursue them in the mitral valve either.  Simply monitor for symptoms.        ===================================  HPI:    Robin [name] is a 87 y.o. female with a PMH notable for significant AORTIC STENOSIS/INSUFFICIENCY (moderate to severe AI/severe AS, along with moderate MR) who presents today for 98-month follow-up.  Robin [name] was last seen by me on September 29, 2019.  She still noting that she tires easily.  Not that anxious.  Was dealing with her husband's death relatively well.  Limited by back pain balance issues and COPD.  No syncope near syncope or chest pain/pressure.  She was subsequently seen by Dr. Clifton James on January 13, and was still debating whether or not she wants to do TAVR  Recent Hospitalizations: None  Reviewed  CV studies:    The following studies were reviewed today: (if available, images/films reviewed: From Epic Chart or Care Everywhere) . TTE 09/06/2020:: EF 6065%.  Normal LV function.  Moderate concentric LVH of the basal septal segment.  Indeterminate filling pattern (likely grade 2 at least with moderate LA dilation).  Normal RV size and function.  Mildly  increased PA pressures estimated at 39.5 mmHg.  Myxomatous mitral valve with mild to moderate MR and mild to moderate MS.  Mean gradient 7 mmHg (HR 85 bpm).  Mild to moderate TR.  Severe aortic valve calcification with moderate to severe's regurgitation and moderate severe stenosis. ->  Decrease in LV function leads to decreased aortic valve gradients.  (Mean gradient 33 mmHg)   Interval History:   Robin [name] is here today again  with her caregiver from the [facility]isted Living [facility].  She is feeling well.  She feels better than she is and she is felt in a while.  She has thought about it not hard, and she does not think she wants to do any invasive procedures.  She acknowledges her age and her lack of mobility at baseline.  She is not really able to walk all that much to do exercise but is able to when she can.  She is some the other comorbidities with her back and legs and thighs hurting her.  She has made a decision that she does not want to go forward with TAVR evaluation.  She denies any chest pain or pressure with rest or exertion.  No PND orthopnea with a little of exertional dyspnea.  No rapid irregular heartbeats palpitations.  The only dyspnea she notes right now is because of allergies.  CV Review of Symptoms (Summary): positive for - Mild exertional dyspnea because of deconditioning and arthritis pains, but nothing changed.  Mostly limited by arthritis pains. negative for - chest pain, edema, irregular heartbeat, orthopnea, palpitations, paroxysmal nocturnal dyspnea, rapid heart rate, shortness of breath or Lightheadedness, dizziness or syncope/near syncope, TIA/amaurosis fugax, claudication  The patient does not have symptoms concerning for COVID-19 infection (fever, chills, cough, or new shortness of breath).   REVIEWED OF SYSTEMS   Review of Systems  Constitutional: Negative for malaise/fatigue and weight loss.  HENT: Negative for congestion and nosebleeds.   Respiratory: Positive for shortness of breath (Only when she overdoes it).   Cardiovascular: Negative for claudication and leg swelling.  Gastrointestinal: Negative for abdominal pain, blood in stool and melena.  Genitourinary: Negative for hematuria.  Musculoskeletal: Positive for back pain and joint pain. Negative for falls and myalgias.  Neurological: Positive for dizziness (If she stands up too fast) and weakness (Her legs are weak). Negative for  focal weakness and headaches.  Psychiatric/Behavioral: Positive for memory loss. Negative for depression (She seems to be in good spirits.). The patient is not nervous/anxious and does not have insomnia.    I have reviewed and (if needed) personally updated the patient's problem list, medications, allergies, past medical and surgical history, social and family history.   PAST MEDICAL HISTORY   Past Medical History:  Diagnosis Date  . Arthritis   . COPD (chronic obstructive pulmonary disease) (HCC)   . Depression   . Diverticulosis   . GERD (gastroesophageal reflux disease)   . Hyperlipemia   . Hypertension   . Irregular heart beat 2014   PVCs  . Moderate to severe aortic insufficiency 07/2019   Echo (03/08/2020): Aortic stenosis is now severe with mean gradient 60 mmHg. Aortic regurgitation is moderate to severe.  . Osteoporosis   . Severe aortic stenosis 06/11/2019   Echo 06/2019: EF 60-65%. Mild basal septal LVH, GR 2 DD. Mod LA dilation.- High LVEDP. Severe AI, Mod AS-peak gradient 43 mmHg, mean 28 mmHg;; 02/2020: Mean AoV Gradient 60 mmHg with Mod AI. EF 70-755.  Mod LVH. Mod LA dilation.  Mod MR.    PAST SURGICAL HISTORY   Past Surgical History:  Procedure Laterality Date  . ABDOMINAL HYSTERECTOMY     both appy and hyst done together  . APPENDECTOMY    . CATARACT EXTRACTION    . TRANSTHORACIC ECHOCARDIOGRAM  06/2019   EF 60-65%. Mild basal septal LVH, GR 2 DD. Mod LA dilation.- High LVEDP. Severe AI, Mod AS-peak gradient 43 mmHg, mean 28 mmHg..  . TRANSTHORACIC ECHOCARDIOGRAM  09/06/2020   EF 60-65%.  Normal LV fxn.  Mod Conc LVH of basal septal segment.  Indeterminate filling pattern (likely Gr2DD w/ Mod LA dilation).  Nl RV size and function.  Mildly increased PAP ~ 39.5 mmHg.  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  Mild-Mod TR.  Severe AoV Ca++ -> Mod-Severe AI & AS (mean gradient 33 mmHg) - ? lower b.c lower LVEF?    Immunization History   Administered Date(s) Administered  . Influenza, High Dose Seasonal PF 06/11/2019  . Influenza-Unspecified 05/18/2020  . Moderna Sars-Covid-2 Vaccination 09/01/2019, 09/29/2019, 07/12/2020  . Pneumococcal Polysaccharide-23 11/07/2013  . Tdap 11/05/2012    MEDICATIONS/ALLERGIES   Current Meds  Medication Sig  . acetaminophen (TYLENOL) 650 MG CR tablet Take 650 mg by mouth in the morning, at noon, and at bedtime.   Marland Kitchen amLODipine (NORVASC) 2.5 MG tablet TAKE 1 TABLET BY MOUTH EVERY DAY  . aspirin 81 MG tablet Take 81 mg by mouth daily.  Marland Kitchen BREO ELLIPTA 100-25 MCG/INH AEPB TAKE 1 PUFF BY MOUTH EVERY DAY  . fluticasone (FLONASE) 50 MCG/ACT nasal spray Place 1 spray into both nostrils as needed.   . lovastatin (MEVACOR) 40 MG tablet TAKE 1 TABLET BY MOUTH EVERY DAY  . MELATONIN PO Take 2 mg by mouth at bedtime as needed.  . sertraline (ZOLOFT) 100 MG tablet TAKE 1 TABLET BY MOUTH EVERY DAY  . SPIRIVA HANDIHALER 18 MCG inhalation capsule 1 CAPSULE BY INHALING THE CONTENTS OF THE CAPSULE USING THE HANDIHALER DEVICE ONCE A DAY INHALATION 90 DAYS  . traMADol (ULTRAM) 50 MG tablet Take 1 tablet (50 mg total) by mouth every 6 (six) hours as needed for moderate pain.  . Vitamin D, Ergocalciferol, (DRISDOL) 1.25 MG (50000 UNIT) CAPS capsule TAKE 1 CAPSULE BY MOUTH ONE TIME PER WEEK  . [DISCONTINUED] valsartan-hydrochlorothiazide (DIOVAN-HCT) 320-12.5 MG tablet TAKE 1 TABLET BY MOUTH EVERY DAY    Allergies  Allergen Reactions  . Fosamax [Alendronate Sodium]     Poor healing to mouth wound  . Augmentin [Amoxicillin-Pot Clavulanate] Nausea Only    DID THE REACTION INVOLVE: Swelling of the face/tongue/throat, SOB, or low BP? No Sudden or severe rash/hives, skin peeling, or the inside of the mouth or nose? No Did it require medical treatment? No When did it last happen? If all above answers are "NO", may proceed with cephalosporin use.   Marland Kitchen Hydrocodone Nausea And Vomiting  . Mobic [Meloxicam]  Rash  . Sulfonamide Derivatives Rash    SOCIAL HISTORY/FAMILY HISTORY   Reviewed in Epic:  Pertinent findings:  Social History   Tobacco Use  . Smoking status: Former Smoker    Packs/day: 1.00    Years: 30.00    Pack years: 30.00    Types: Cigarettes    Quit date: 08/28/2008    Years since quitting: 12.2  . Smokeless tobacco: Never Used  Vaping Use  . Vaping Use: Never used  Substance Use Topics  . Alcohol use: Yes  Alcohol/week: 14.0 standard drinks    Types: 14 Standard drinks or equivalent per week    Comment: 1-2 glass of wine or mixed drink daily  . Drug use: No   Social History   Social History Narrative   Currently remarried.  New husband has significant cardiac history.  (Followed by Dr. Herbie Baltimore)   She is a retired Runner, broadcasting/film/video.      She and her new husband [name]") moved to [facility] just prior to the outbreak of the pandemic in the Spring of 2020.    [name] - Tamyka Bezio) died 02-09-20 -> longstanding Parkinson's disease complicated by dementia.      --> Now finally able to work with PT/OT to rehab - chronic back pain with very unsteady gait/poor balance. -- Uses cane for short distance - but mostly uses Walker.  Really only walks to & from the cafeteria if not working with therapy.She does have a pretty unsteady gait and has to either use a cane for short distances or uses a walker for longer distance. -->This is really because of her bad back and poor balance.  As result of this, she really is not all that active, and is very upset about not being on to do her rehab.    OBJCTIVE -PE, EKG, labs   Wt Readings from Last 3 Encounters:  11/10/20 131 lb 6.4 oz (59.6 kg)  11/01/20 132 lb (59.9 kg)  09/09/20 133 lb (60.3 kg)    Physical Exam: BP 125/67   Pulse 77   Ht 5\' 2"  (1.575 m)   Wt 132 lb (59.9 kg)   SpO2 93%   BMI 24.14 kg/m  Physical Exam Vitals reviewed.  Constitutional:      General: She is not in acute distress.    Appearance: Normal  appearance. She is not ill-appearing or toxic-appearing.     Comments: Somewhat thin, borderline frail elderly woman.  Appropriate for her stated age.  Well-groomed.  HENT:     Head: Normocephalic and atraumatic.  Neck:     Vascular: Carotid bruit (Radiated aortic stenosis murmur) present. No hepatojugular reflux or JVD.  Cardiovascular:     Rate and Rhythm: Normal rate and regular rhythm.     Pulses: Normal pulses.     Heart sounds: Murmur heard.  High-pitched harsh crescendo-decrescendo mid to late systolic murmur is present with a grade of 4/6 at the upper right sternal border radiating to the neck. High-pitched blowing holosystolic murmur of grade 2/6 is also present at the apex radiating to the back. High-pitched blowing decrescendo early diastolic murmur is present with a grade of 1/4 at the upper right sternal border radiating to the apex. No friction rub. No gallop.   Pulmonary:     Effort: Pulmonary effort is normal. No respiratory distress.     Breath sounds: Normal breath sounds.  Chest:     Chest wall: No tenderness.  Musculoskeletal:        General: No swelling, tenderness or deformity. Normal range of motion.     Cervical back: Normal range of motion and neck supple.  Skin:    General: Skin is warm and dry.  Neurological:     General: No focal deficit present.     Mental Status: She is alert and oriented to person, place, and time.  Psychiatric:        Mood and Affect: Mood normal.        Behavior: Behavior normal.  Thought Content: Thought content normal.        Judgment: Judgment normal.     Comments: She is clearly of sound mind and body     Adult ECG Report  Rate: 77;  Rhythm: normal sinus rhythm and 1 AVB.  CRO AMI age-indeterminate.;  Otherwise normal axis, intervals and durations.  Narrative Interpretation: Stable  Recent Labs: Reviewed Lab Results  Component Value Date   CHOL 170 11/04/2020   HDL 77 11/04/2020   LDLCALC 78 11/04/2020   TRIG 69  11/04/2020   CHOLHDL 2.2 11/04/2020   Lab Results  Component Value Date   CREATININE 0.83 11/04/2020   BUN 18 11/04/2020   NA 135 11/04/2020   K 4.3 11/04/2020   CL 98 11/04/2020   CO2 25 11/04/2020   CBC Latest Ref Rng & Units 11/04/2020 03/17/2020 05/08/2019  WBC 3.8 - 10.8 Thousand/uL 5.9 8.1 6.4  Hemoglobin 11.7 - 15.5 g/dL 81.112.4 91.411.8 11.0(L)  Hematocrit 35.0 - 45.0 % 36.8 34.4(L) 32.4(L)  Platelets 140 - 400 Thousand/uL 221 304 302    Lab Results  Component Value Date   TSH 1.46 11/04/2020    ==================================================  COVID-19 Education: The signs and symptoms of COVID-19 were discussed with the patient and how to seek care for testing (follow up with PCP or arrange E-visit).   The importance of social distancing and COVID-19 vaccination was discussed today. The patient is practicing social distancing & Masking.   I spent a total of 23 minutes with the patient spent in direct patient consultation.   Additional time spent with chart review  / charting (studies, outside notes, etc): 18 min- -> echocardiogram personally reviewed including result note.  Compared to previous studies.  Total Time: 41 min   Current medicines are reviewed at length with the patient today.  (+/- concerns) n/a  This visit occurred during the SARS-CoV-2 public health emergency.  Safety protocols were in place, including screening questions prior to the visit, additional usage of staff PPE, and extensive cleaning of exam room while observing appropriate contact time as indicated for disinfecting solutions.  Notice: This dictation was prepared with Dragon dictation along with smaller phrase technology. Any transcriptional errors that result from this process are unintentional and may not be corrected upon review.  Patient Instructions / Medication Changes & Studies & Tests Ordered   Patient Instructions  Medication Instructions:  No changes  *If you need a refill on your  cardiac medications before your next appointment, please call your pharmacy*   Lab Work: Not needed If you have labs (blood work) drawn today and your tests are completely normal, you will receive your results only by: Marland Kitchen. MyChart Message (if you have MyChart) OR . A paper copy in the mail If you have any lab test that is abnormal or we need to change your treatment, we will call you to review the results.   Testing/Procedures: Not needed   Follow-Up: At [facility], you and your health needs are our priority.  As part of our continuing mission to provide you with exceptional heart care, we have created designated Provider Care Teams.  These Care Teams include your primary Cardiologist (physician) and Advanced Practice Providers (APPs -  Physician Assistants and Nurse Practitioners) who all work together to provide you with the care you need, when you need it.  We recommend signing up for the patient portal called "MyChart".  Sign up information is provided on this After Visit Summary.  MyChart is used  to connect with patients for Virtual Visits (Telemedicine).  Patients are able to view lab/test results, encounter notes, upcoming appointments, etc.  Non-urgent messages can be sent to your provider as well.   To learn more about what you can do with MyChart, go to [email or web address].    Your next appointment:   6 month(s)  The format for your next appointment:   in person or virtual  Provider:   Bryan Lemma, MD    Studies Ordered:   Orders Placed This Encounter  Procedures  . EKG 12-Lead     Bryan Lemma, M.D., M.S. Interventional Cardiologist   Pager # [phone] Phone # [phone] [address] [place], [place] [address]   Thank you for choosing [facility] at [facility]!!

## 2020-11-01 NOTE — Patient Instructions (Signed)
Medication Instructions:  No changes  *If you need a refill on your cardiac medications before your next appointment, please call your pharmacy*   Lab Work: Not needed If you have labs (blood work) drawn today and your tests are completely normal, you will receive your results only by: Marland Kitchen MyChart Message (if you have MyChart) OR . A paper copy in the mail If you have any lab test that is abnormal or we need to change your treatment, we will call you to review the results.   Testing/Procedures: Not needed   Follow-Up: At [facility], you and your health needs are our priority.  As part of our continuing mission to provide you with exceptional heart care, we have created designated Provider Care Teams.  These Care Teams include your primary Cardiologist (physician) and Advanced Practice Providers (APPs -  Physician Assistants and Nurse Practitioners) who all work together to provide you with the care you need, when you need it.  We recommend signing up for the patient portal called "MyChart".  Sign up information is provided on this After Visit Summary.  MyChart is used to connect with patients for Virtual Visits (Telemedicine).  Patients are able to view lab/test results, encounter notes, upcoming appointments, etc.  Non-urgent messages can be sent to your provider as well.   To learn more about what you can do with MyChart, go to [email or web address].    Your next appointment:   6 month(s)  The format for your next appointment:   in person or virtual  Provider:   Bryan Lemma, MD

## 2020-11-04 ENCOUNTER — Other Ambulatory Visit: Payer: Self-pay

## 2020-11-04 DIAGNOSIS — I10 Essential (primary) hypertension: Secondary | ICD-10-CM | POA: Diagnosis not present

## 2020-11-04 DIAGNOSIS — E7849 Other hyperlipidemia: Secondary | ICD-10-CM | POA: Diagnosis not present

## 2020-11-05 LAB — CBC WITH DIFFERENTIAL/PLATELET
Absolute Monocytes: 484 cells/uL (ref 200–950)
Basophils Absolute: 41 cells/uL (ref 0–200)
Basophils Relative: 0.7 %
Eosinophils Absolute: 112 cells/uL (ref 15–500)
Eosinophils Relative: 1.9 %
HCT: 36.8 % (ref 35.0–45.0)
Hemoglobin: 12.4 g/dL (ref 11.7–15.5)
Lymphs Abs: 1640 cells/uL (ref 850–3900)
MCH: 30.9 pg (ref 27.0–33.0)
MCHC: 33.7 g/dL (ref 32.0–36.0)
MCV: 91.8 fL (ref 80.0–100.0)
MPV: 9.7 fL (ref 7.5–12.5)
Monocytes Relative: 8.2 %
Neutro Abs: 3623 cells/uL (ref 1500–7800)
Neutrophils Relative %: 61.4 %
Platelets: 221 10*3/uL (ref 140–400)
RBC: 4.01 10*6/uL (ref 3.80–5.10)
RDW: 12.5 % (ref 11.0–15.0)
Total Lymphocyte: 27.8 %
WBC: 5.9 10*3/uL (ref 3.8–10.8)

## 2020-11-05 LAB — LIPID PANEL
Cholesterol: 170 mg/dL (ref <200)
HDL: 77 mg/dL (ref >=50)
LDL Cholesterol (Calc): 78 mg/dL (calc)
Non-HDL Cholesterol (Calc): 93 mg/dL (calc) (ref <130)
Total CHOL/HDL Ratio: 2.2 (calc) (ref <5.0)
Triglycerides: 69 mg/dL (ref <150)

## 2020-11-05 LAB — COMPLETE METABOLIC PANEL WITH GFR
AG Ratio: 1.7 (calc) (ref 1.0–2.5)
ALT: 10 U/L (ref 6–29)
AST: 16 U/L (ref 10–35)
Albumin: 4.1 g/dL (ref 3.6–5.1)
Alkaline phosphatase (APISO): 66 U/L (ref 37–153)
BUN: 18 mg/dL (ref 7–25)
CO2: 25 mmol/L (ref 20–32)
Calcium: 9 mg/dL (ref 8.6–10.4)
Chloride: 98 mmol/L (ref 98–110)
Creat: 0.83 mg/dL (ref 0.60–0.88)
GFR, Est African American: 74 mL/min/{1.73_m2} (ref >=60)
GFR, Est Non African American: 64 mL/min/{1.73_m2} (ref >=60)
Globulin: 2.4 g/dL (calc) (ref 1.9–3.7)
Glucose, Bld: 94 mg/dL (ref 65–99)
Potassium: 4.3 mmol/L (ref 3.5–5.3)
Sodium: 135 mmol/L (ref 135–146)
Total Bilirubin: 0.6 mg/dL (ref 0.2–1.2)
Total Protein: 6.5 g/dL (ref 6.1–8.1)

## 2020-11-05 LAB — TSH: TSH: 1.46 mIU/L (ref 0.40–4.50)

## 2020-11-08 ENCOUNTER — Other Ambulatory Visit: Payer: Self-pay | Admitting: Nurse Practitioner

## 2020-11-10 ENCOUNTER — Encounter: Payer: Self-pay | Admitting: Internal Medicine

## 2020-11-10 ENCOUNTER — Other Ambulatory Visit: Payer: Self-pay

## 2020-11-10 ENCOUNTER — Non-Acute Institutional Stay: Payer: Medicare PPO | Admitting: Internal Medicine

## 2020-11-10 VITALS — BP 138/70 | HR 82 | Temp 97.4°F | Ht 62.0 in | Wt 131.4 lb

## 2020-11-10 DIAGNOSIS — R600 Localized edema: Secondary | ICD-10-CM | POA: Diagnosis not present

## 2020-11-10 DIAGNOSIS — F339 Major depressive disorder, recurrent, unspecified: Secondary | ICD-10-CM

## 2020-11-10 DIAGNOSIS — I351 Nonrheumatic aortic (valve) insufficiency: Secondary | ICD-10-CM | POA: Diagnosis not present

## 2020-11-10 DIAGNOSIS — I352 Nonrheumatic aortic (valve) stenosis with insufficiency: Secondary | ICD-10-CM

## 2020-11-10 DIAGNOSIS — G8929 Other chronic pain: Secondary | ICD-10-CM

## 2020-11-10 DIAGNOSIS — J410 Simple chronic bronchitis: Secondary | ICD-10-CM

## 2020-11-10 DIAGNOSIS — E871 Hypo-osmolality and hyponatremia: Secondary | ICD-10-CM

## 2020-11-10 DIAGNOSIS — M545 Low back pain, unspecified: Secondary | ICD-10-CM | POA: Diagnosis not present

## 2020-11-10 DIAGNOSIS — R194 Change in bowel habit: Secondary | ICD-10-CM

## 2020-11-10 DIAGNOSIS — M810 Age-related osteoporosis without current pathological fracture: Secondary | ICD-10-CM | POA: Diagnosis not present

## 2020-11-10 DIAGNOSIS — I10 Essential (primary) hypertension: Secondary | ICD-10-CM

## 2020-11-10 NOTE — Patient Instructions (Signed)
Can try to stop using Breo. But if you feel SOB or cough restart it

## 2020-11-10 NOTE — Progress Notes (Signed)
Location:  [facility] Service:  Clinic (12)  Provider:   Code Status:  Goals of Care:  Advanced Directives 09/23/2020  Does Patient Have a Medical Advance Directive? Yes  Type of Advance Directive Out of facility DNR (pink MOST or yellow form)  Does patient want to make changes to medical advance directive? No - Patient declined  Copy of Healthcare Power of Attorney in Chart? -  Pre-existing out of facility DNR order (yellow form or pink MOST form) Pink MOST form placed in chart (order not valid for inpatient use)     Chief Complaint  Patient presents with  . Medical Management of Chronic Issues    4 moth follow up. She would like to discuss bone density plans. She declined heart procedure. Would like to discuss spiriva.     HPI: Patient is a 87 y.o. female seen today for medical management of chronic diseases.    Has h/o Severe AS with AR Has decided not to undergo TAVR. She states that she is not having any dyspnea anymore.  Follows closely with cardiology Depression and anxiety Doing better.  Lost her husband a year ago Chronic back pain Has seen neurosurgery before Alternating diarrhea and constipation questionable IBS.  Has seen GI. COPD HLD, hypertension  Doing well overall.  Walks with her walker no recent fall.  Her stepson who lives here is the POA.  Has a son who lives in [place]  Past Medical History:  Diagnosis Date  . Arthritis   . COPD (chronic obstructive pulmonary disease) (HCC)   . Depression   . Diverticulosis   . GERD (gastroesophageal reflux disease)   . Hyperlipemia   . Hypertension   . Irregular heart beat 2014   PVCs  . Moderate to severe aortic insufficiency 07/2019   Echo (03/08/2020): Aortic stenosis is now severe with mean gradient 60 mmHg. Aortic regurgitation is moderate to severe.  . Osteoporosis   . Severe aortic stenosis 06/11/2019   Echo 06/2019: EF 60-65%. Mild basal septal LVH, GR 2 DD. Mod LA dilation.- High  LVEDP. Severe AI, Mod AS-peak gradient 43 mmHg, mean 28 mmHg;; 02/2020: Mean AoV Gradient 60 mmHg with Mod AI. EF 70-755.  Mod LVH. Mod LA dilation. Mod MR.    Past Surgical History:  Procedure Laterality Date  . ABDOMINAL HYSTERECTOMY     both appy and hyst done together  . APPENDECTOMY    . CATARACT EXTRACTION    . TRANSTHORACIC ECHOCARDIOGRAM  06/2019   EF 60-65%. Mild basal septal LVH, GR 2 DD. Mod LA dilation.- High LVEDP. Severe AI, Mod AS-peak gradient 43 mmHg, mean 28 mmHg..    Allergies  Allergen Reactions  . Fosamax [Alendronate Sodium]     Poor healing to mouth wound  . Augmentin [Amoxicillin-Pot Clavulanate] Nausea Only    DID THE REACTION INVOLVE: Swelling of the face/tongue/throat, SOB, or low BP? No Sudden or severe rash/hives, skin peeling, or the inside of the mouth or nose? No Did it require medical treatment? No When did it last happen? If all above answers are "NO", may proceed with cephalosporin use.   Marland Kitchen Hydrocodone Nausea And Vomiting  . Mobic [Meloxicam] Rash  . Sulfonamide Derivatives Rash    Outpatient Encounter Medications as of 11/10/2020  Medication Sig  . acetaminophen (TYLENOL) 650 MG CR tablet Take 650 mg by mouth in the morning, at noon, and at bedtime.   Marland Kitchen amLODipine (NORVASC) 2.5 MG tablet TAKE 1 TABLET BY  MOUTH EVERY DAY  . aspirin 81 MG tablet Take 81 mg by mouth daily.  Marland Kitchen BREO ELLIPTA 100-25 MCG/INH AEPB TAKE 1 PUFF BY MOUTH EVERY DAY  . fluticasone (FLONASE) 50 MCG/ACT nasal spray Place 1 spray into both nostrils as needed.   . lovastatin (MEVACOR) 40 MG tablet TAKE 1 TABLET BY MOUTH EVERY DAY  . MELATONIN PO Take 2 mg by mouth at bedtime as needed.  . pantoprazole (PROTONIX) 20 MG tablet Take 20 mg by mouth daily as needed.  . sertraline (ZOLOFT) 100 MG tablet TAKE 1 TABLET BY MOUTH EVERY DAY  . SPIRIVA HANDIHALER 18 MCG inhalation capsule 1 CAPSULE BY INHALING THE CONTENTS OF THE CAPSULE USING THE HANDIHALER DEVICE ONCE A DAY  INHALATION 90 DAYS  . traMADol (ULTRAM) 50 MG tablet Take 1 tablet (50 mg total) by mouth every 6 (six) hours as needed for moderate pain.  . valsartan-hydrochlorothiazide (DIOVAN-HCT) 320-12.5 MG tablet TAKE 1 TABLET BY MOUTH EVERY DAY  . Vitamin D, Ergocalciferol, (DRISDOL) 1.25 MG (50000 UNIT) CAPS capsule TAKE 1 CAPSULE BY MOUTH ONE TIME PER WEEK   No facility-administered encounter medications on file as of 11/10/2020.    Review of Systems:  Review of Systems  Review of Systems  Constitutional: Negative for activity change, appetite change, chills, diaphoresis, fatigue and fever.  HENT: Negative for mouth sores, postnasal drip, rhinorrhea, sinus pain and sore throat.   Respiratory: Negative for apnea, cough, chest tightness, shortness of breath and wheezing.   Cardiovascular: Negative for chest pain, palpitations and leg swelling.  Gastrointestinal: Negative for abdominal distention, abdominal pain, constipation, diarrhea, nausea and vomiting.  Genitourinary: Negative for dysuria and frequency.  Musculoskeletal: Negative for arthralgias, joint swelling and myalgias.  Skin: Negative for rash.  Neurological: Negative for dizziness, syncope, weakness, light-headedness and numbness.  Psychiatric/Behavioral: Negative for behavioral problems, confusion and sleep disturbance.      Health Maintenance  Topic Date Due  . DEXA SCAN  Never done  . PNA vac Low Risk Adult (2 of 2 - PCV13) 11/08/2014  . TETANUS/TDAP  11/06/2022  . INFLUENZA VACCINE  Completed  . COVID-19 Vaccine  Completed  . HPV VACCINES  Aged Out    Physical Exam: Vitals:   11/10/20 1337  BP: 138/70  Pulse: 82  Temp: (!) 97.4 F (36.3 C)  SpO2: 99%  Weight: 131 lb 6.4 oz (59.6 kg)  Height: 5\' 2"  (1.575 m)   Body mass index is 24.03 kg/m. Physical Exam Constitutional: Oriented to person, place, and time. Well-developed and well-nourished.  HENT:  Head: Normocephalic.  Mouth/Throat: Oropharynx is clear and  moist.  Eyes: Pupils are equal, round, and reactive to light.  Neck: Neck supple.  Cardiovascular: Normal rate and normal heart sounds.  Murmur Present Pulmonary/Chest: Effort normal and breath sounds normal. No respiratory distress. No wheezes. She has no rales.  Abdominal: Soft. Bowel sounds are normal. No distension. There is no tenderness. There is no rebound.  Musculoskeletal: No edema.  Lymphadenopathy: none Neurological: Alert and oriented to person, place, and time. Has Unstable gait with drags her Feet Skin: Skin is warm and dry.  Psychiatric: Normal mood and affect. Behavior is normal. Thought content normal.   Labs reviewed: Basic Metabolic Panel: Recent Labs    03/17/20 0821 11/04/20 0820  NA 138 135  K 3.7 4.3  CL 98 98  CO2 29 25  GLUCOSE 88 94  BUN 18 18  CREATININE 0.78 0.83  CALCIUM 9.3 9.0  TSH  --  1.46   Liver Function Tests: Recent Labs    03/17/20 0821 11/04/20 0820  AST 15 16  ALT 9 10  BILITOT 0.5 0.6  PROT 6.6 6.5   No results for input(s): LIPASE, AMYLASE in the last 8760 hours. No results for input(s): AMMONIA in the last 8760 hours. CBC: Recent Labs    03/17/20 0821 11/04/20 0820  WBC 8.1 5.9  NEUTROABS 4,836 3,623  HGB 11.8 12.4  HCT 34.4* 36.8  MCV 90.8 91.8  PLT 304 221   Lipid Panel: Recent Labs    03/17/20 0821 11/04/20 0820  CHOL 175 170  HDL 73 77  LDLCALC 84 78  TRIG 89 69  CHOLHDL 2.4 2.2   No results found for: HGBA1C  Procedures since last visit: No results found.  Assessment/Plan 1. Chronic bilateral low back pain without sciatica h/o Lumbar Stenosis with Claudication Takes Tylenol and Tramadol PRN Wants to try 4 wheeled walker Says she was seen By Dr Jule Ser before. No Surgery candidate  2.  Nonrheumatic aortic insufficiency with aortic stenosis Having no symptoms.Refiused surgery right now  3. Hyponatremia Had one episode 2 years ago  Thought to be due to dehydration Repeat Sodium has been in  Normal Limits Continues ot be on Zoloft and Diovan   4. Essential hypertension Stable on Norvasc and Diovan  5. Depression, recurrent (HCC) Doing much better with her mood after loosing her Spouse Continues on Zoloft  6. Bilateral leg edema Does not need Lasix anymore  No Edema on Exam today  7. Simple chronic bronchitis (HCC) She is not using her Breo and Spiriva  I have told her to stop using Breo and keep up with Spiriva  8. Osteoporosis, unspecified osteoporosis type, unspecified pathological fracture presence Was on Reclast for 8 years Does not want to Repeat DEXA at this time Will wait till next visit  9.Change in bowel habit Doing well without any Meds 10. Insomnia Takes Melatonin as needed 11. HLD Doing well on Stattin   Labs/tests ordered:  * No order type specified * Next appt:  03/09/2021

## 2020-11-26 ENCOUNTER — Encounter: Payer: Self-pay | Admitting: Cardiology

## 2020-11-26 DIAGNOSIS — I340 Nonrheumatic mitral (valve) insufficiency: Secondary | ICD-10-CM | POA: Insufficient documentation

## 2020-11-26 NOTE — Assessment & Plan Note (Signed)
Blood pressure looks pretty well controlled on current meds-low-dose amlodipine along with high-dose valsartan and HCTZ.

## 2020-11-26 NOTE — Assessment & Plan Note (Signed)
Myxomatous mitral valve noted.  No real prolapse.  Now moderate stenosis and mild to moderate regurgitation.  The presence of mitral valvular disease along with aortic valve disease would suggest that this is probably potentially rheumatic in nature, but we cannot be sure.  I also think that having mitral disease would make intervening on the aortic valve somewhat incomplete as far as treatment goes.  She does not want to pursue TAVR and therefore not want to pursue them in the mitral valve either.  Simply monitor for symptoms.

## 2020-11-26 NOTE — Assessment & Plan Note (Signed)
Again now moderate to severe insufficiency.  Not overly symptomatic.  Does not want TAVR.  Therefore would not pursue further evaluation. Exam monitor for symptoms.  Try to avoid hypotension.  For now not requiring any more diuretic than the 12.5 mg HCTZ.

## 2020-11-26 NOTE — Assessment & Plan Note (Signed)
Asymptomatic.  She did not tolerate beta-blocker because of fatigue.  Will hold off for now.

## 2020-11-26 NOTE — Assessment & Plan Note (Signed)
Interesting that her stenosis is right is less than severe now.  Now moderate to severe.  EF had has gone down a little as well.  She is relatively asymptomatic, limited more by arthritis pains.  Now that she has made a decision that we are not going to go through with any further testing toward TAVR, we can stop checking surveillance echoes.  Continue to monitor for symptoms and work on try to reduce risk factors. Plan: Continue blood pressure control with amlodipine and valsartan-HCTZ. Continue lovastatin. Continue aspirin.

## 2021-02-04 ENCOUNTER — Other Ambulatory Visit: Payer: Self-pay | Admitting: Internal Medicine

## 2021-02-10 ENCOUNTER — Other Ambulatory Visit: Payer: Medicare PPO

## 2021-02-23 ENCOUNTER — Other Ambulatory Visit: Payer: Self-pay | Admitting: Internal Medicine

## 2021-03-09 ENCOUNTER — Encounter: Payer: Self-pay | Admitting: Internal Medicine

## 2021-03-09 ENCOUNTER — Other Ambulatory Visit: Payer: Self-pay

## 2021-03-09 ENCOUNTER — Non-Acute Institutional Stay: Payer: Medicare PPO | Admitting: Internal Medicine

## 2021-03-09 VITALS — BP 146/70 | HR 74 | Temp 96.9°F | Ht 62.0 in | Wt 134.0 lb

## 2021-03-09 DIAGNOSIS — G8929 Other chronic pain: Secondary | ICD-10-CM

## 2021-03-09 DIAGNOSIS — I351 Nonrheumatic aortic (valve) insufficiency: Secondary | ICD-10-CM

## 2021-03-09 DIAGNOSIS — R600 Localized edema: Secondary | ICD-10-CM | POA: Diagnosis not present

## 2021-03-09 DIAGNOSIS — E7849 Other hyperlipidemia: Secondary | ICD-10-CM

## 2021-03-09 DIAGNOSIS — J410 Simple chronic bronchitis: Secondary | ICD-10-CM | POA: Diagnosis not present

## 2021-03-09 DIAGNOSIS — F339 Major depressive disorder, recurrent, unspecified: Secondary | ICD-10-CM | POA: Diagnosis not present

## 2021-03-09 DIAGNOSIS — I10 Essential (primary) hypertension: Secondary | ICD-10-CM

## 2021-03-09 DIAGNOSIS — M545 Low back pain, unspecified: Secondary | ICD-10-CM

## 2021-03-09 DIAGNOSIS — M1990 Unspecified osteoarthritis, unspecified site: Secondary | ICD-10-CM | POA: Diagnosis not present

## 2021-03-09 DIAGNOSIS — M810 Age-related osteoporosis without current pathological fracture: Secondary | ICD-10-CM

## 2021-03-09 DIAGNOSIS — E871 Hypo-osmolality and hyponatremia: Secondary | ICD-10-CM | POA: Diagnosis not present

## 2021-03-09 NOTE — Progress Notes (Signed)
Location:  [facility]  [facility] (12)  Provider:   Code Status: DNR Goals of Care:  Advanced Directives 03/09/2021  Does Patient Have a Medical Advance Directive? Yes  Type of Advance Directive Living will;Healthcare Power of Attorney  Does patient want to make changes to medical advance directive? No - Patient declined  Copy of Healthcare Power of Attorney in Chart? Yes - validated most recent copy scanned in chart (See row information)  Pre-existing out of facility DNR order (yellow form or pink MOST form) -     Chief Complaint  Patient presents with   Medical Management of Chronic Issues    Patient returns to the clinic for her 4 month follow up.    Health Maintenance    Dexa, shingrix, and PCV13    HPI: Patient is a 87 y.o. female seen today for medical management of chronic diseases.    Has h/o Severe AS with AR Has decided not to undergo TAVR. Stays Asymptomatic Follows with Cardiology Depression and anxiety Stable Lost her husband year ago Chronic back pain Has seen neurosurgery before Alternating diarrhea and constipation questionable IBS.  Has seen GI. COPD HLD, hypertension  Doing well Had no Complains Walks with her walker. Her stepson is POA Her own son lives in [place] Appetite is good. Weight stable  Past Medical History:  Diagnosis Date   Arthritis    COPD (chronic obstructive pulmonary disease) (HCC)    Depression    Diverticulosis    GERD (gastroesophageal reflux disease)    Hyperlipemia    Hypertension    Irregular heart beat 2014   PVCs   Moderate to severe aortic insufficiency 07/2019   Echo (03/08/2020): Aortic stenosis is now severe with mean gradient 60 mmHg. Aortic regurgitation is moderate to severe.   Osteoporosis    Severe aortic stenosis 06/11/2019   Echo 06/2019: EF 60-65%. Mild basal septal LVH, GR 2 DD. Mod LA dilation.- High LVEDP. Severe AI, Mod AS-peak gradient 43 mmHg, mean 28 mmHg;; 02/2020: Mean  AoV Gradient 60 mmHg with Mod AI. EF 70-755.  Mod LVH. Mod LA dilation. Mod MR.    Past Surgical History:  Procedure Laterality Date   ABDOMINAL HYSTERECTOMY     both appy and hyst done together   APPENDECTOMY     CATARACT EXTRACTION     TRANSTHORACIC ECHOCARDIOGRAM  06/2019   EF 60-65%. Mild basal septal LVH, GR 2 DD. Mod LA dilation.- High LVEDP. Severe AI, Mod AS-peak gradient 43 mmHg, mean 28 mmHg.Marland Kitchen   TRANSTHORACIC ECHOCARDIOGRAM  09/06/2020   EF 60-65%.  Normal LV fxn.  Mod Conc LVH of basal septal segment.  Indeterminate filling pattern (likely Gr2DD w/ Mod LA dilation).  Nl RV size and function.  Mildly increased PAP ~ 39.5 mmHg.  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  Mild-Mod TR.  Severe AoV Ca++ -> Mod-Severe AI & AS (mean gradient 33 mmHg) - ? lower b.c lower LVEF?    Allergies  Allergen Reactions   Fosamax [Alendronate Sodium]     Poor healing to mouth wound   Augmentin [Amoxicillin-Pot Clavulanate] Nausea Only    DID THE REACTION INVOLVE: Swelling of the face/tongue/throat, SOB, or low BP? No Sudden or severe rash/hives, skin peeling, or the inside of the mouth or nose? No Did it require medical treatment? No When did it last happen?       If all above answers  are "NO", may proceed with cephalosporin use.    Hydrocodone Nausea And Vomiting   Mobic [Meloxicam] Rash   Sulfonamide Derivatives Rash    Outpatient Encounter Medications as of 03/09/2021  Medication Sig   acetaminophen (TYLENOL) 650 MG CR tablet Take 650 mg by mouth in the morning, at noon, and at bedtime.    amLODipine (NORVASC) 2.5 MG tablet TAKE 1 TABLET BY MOUTH EVERY DAY   aspirin 81 MG tablet Take 81 mg by mouth daily.   BREO ELLIPTA 100-25 MCG/INH AEPB TAKE 1 PUFF BY MOUTH EVERY DAY   fluticasone (FLONASE) 50 MCG/ACT nasal spray Place 1 spray into both nostrils as needed.    lovastatin (MEVACOR) 40 MG tablet TAKE 1 TABLET BY MOUTH EVERY DAY   MELATONIN PO Take 2 mg by  mouth at bedtime as needed.   pantoprazole (PROTONIX) 20 MG tablet Take 20 mg by mouth daily as needed.   sertraline (ZOLOFT) 100 MG tablet TAKE 1 TABLET BY MOUTH EVERY DAY   SPIRIVA HANDIHALER 18 MCG inhalation capsule 1 CAPSULE BY INHALING THE CONTENTS OF THE CAPSULE USING THE HANDIHALER DEVICE ONCE A DAY INHALATION 90 DAYS   traMADol (ULTRAM) 50 MG tablet Take 1 tablet (50 mg total) by mouth every 6 (six) hours as needed for moderate pain.   valsartan-hydrochlorothiazide (DIOVAN-HCT) 320-12.5 MG tablet TAKE 1 TABLET BY MOUTH EVERY DAY   Vitamin D, Ergocalciferol, (DRISDOL) 1.25 MG (50000 UNIT) CAPS capsule TAKE 1 CAPSULE BY MOUTH ONE TIME PER WEEK   No facility-administered encounter medications on file as of 03/09/2021.    Review of Systems:  Review of Systems Review of Systems  Constitutional: Negative for activity change, appetite change, chills, diaphoresis, fatigue and fever.  HENT: Negative for mouth sores, postnasal drip, rhinorrhea, sinus pain and sore throat.   Respiratory: Negative for apnea, cough, chest tightness, shortness of breath and wheezing.   Cardiovascular: Negative for chest pain, palpitations and leg swelling.  Gastrointestinal: Negative for abdominal distention, abdominal pain, constipation, diarrhea, nausea and vomiting.  Genitourinary: Negative for dysuria and frequency.  Musculoskeletal: Negative for arthralgias, joint swelling and myalgias.  Skin: Negative for rash.  Neurological: Negative for dizziness, syncope, weakness, light-headedness and numbness.  Psychiatric/Behavioral: Negative for behavioral problems, confusion and sleep disturbance.    Health Maintenance  Topic Date Due   Zoster Vaccines- Shingrix (1 of 2) Never done   DEXA SCAN  Never done   PNA vac Low Risk Adult (2 of 2 - PCV13) 11/08/2014   INFLUENZA VACCINE  03/28/2021   TETANUS/TDAP  11/06/2022   COVID-19 Vaccine  Completed   HPV VACCINES  Aged Out    Physical Exam: Vitals:    03/09/21 1625  BP: (!) 146/70  Pulse: 74  Temp: (!) 96.9 F (36.1 C)  SpO2: 94%  Weight: 134 lb (60.8 kg)  Height: 5\' 2"  (1.575 m)   Body mass index is 24.51 kg/m. Physical Exam Constitutional: Oriented to person, place, and time. Well-developed and well-nourished.  HENT:  Head: Normocephalic.  Mouth/Throat: Oropharynx is clear and moist.  Eyes: Pupils are equal, round, and reactive to light.  Neck: Neck supple.  Cardiovascular: Normal rate and normal heart sounds.  Murmur Present. Pulmonary/Chest: Effort normal and breath sounds normal. No respiratory distress. No wheezes. She has no rales.  Abdominal: Soft. Bowel sounds are normal. No distension. There is no tenderness. There is no rebound.  Musculoskeletal: No edema.  Lymphadenopathy: none Neurological: Alert and oriented to person, place, and time.  Skin: Skin  is warm and dry.  Psychiatric: Normal mood and affect. Behavior is normal. Thought content normal.   Labs reviewed: Basic Metabolic Panel: Recent Labs    03/17/20 0821 11/04/20 0820  NA 138 135  K 3.7 4.3  CL 98 98  CO2 29 25  GLUCOSE 88 94  BUN 18 18  CREATININE 0.78 0.83  CALCIUM 9.3 9.0  TSH  --  1.46   Liver Function Tests: Recent Labs    03/17/20 0821 11/04/20 0820  AST 15 16  ALT 9 10  BILITOT 0.5 0.6  PROT 6.6 6.5   No results for input(s): LIPASE, AMYLASE in the last 8760 hours. No results for input(s): AMMONIA in the last 8760 hours. CBC: Recent Labs    03/17/20 0821 11/04/20 0820  WBC 8.1 5.9  NEUTROABS 4,836 3,623  HGB 11.8 12.4  HCT 34.4* 36.8  MCV 90.8 91.8  PLT 304 221   Lipid Panel: Recent Labs    03/17/20 0821 11/04/20 0820  CHOL 175 170  HDL 73 77  LDLCALC 84 78  TRIG 89 69  CHOLHDL 2.4 2.2   No results found for: HGBA1C  Procedures since last visit: No results found.  Assessment/Plan 1. Chronic bilateral low back pain without sciatica Tylenol only for pain control Keeps Tramadol only for Severe  Pain Using Dan Humphreys now and it is helping some Says she was seen By Dr Jule Ser before. No Surgery candidate 2. Severe aortic insufficiency and AS No Symptoms Follows with Cardiologist Decided against Surgery  3. Essential hypertension Stable on Diovan and Norvasc  4. Depression, recurrent (HCC) Symptoms are stable on Zoloft  5. Bilateral leg edema NO edema anymore Off lasix  6. Simple chronic bronchitis (HCC) Does not use any inhalers  7. Osteoporosis, unspecified osteoporosis type, unspecified pathological fracture presence Received Reclast for 8 ers Doe snot want DEXA right now  8. Hyponatremia Had one episode 2 years ago Thought to be due to dehydration Repeat Sodium has been in Normal Limits Continues ot be on Zoloft and Diovan  9. Arthritis Tylenol   10. Other hyperlipidemia On statin    Labs/tests ordered:  * No order type specified * Next appt:  09/01/2021

## 2021-03-11 ENCOUNTER — Other Ambulatory Visit: Payer: Self-pay | Admitting: Internal Medicine

## 2021-04-25 ENCOUNTER — Other Ambulatory Visit: Payer: Self-pay | Admitting: Internal Medicine

## 2021-05-02 ENCOUNTER — Other Ambulatory Visit: Payer: Self-pay | Admitting: Internal Medicine

## 2021-05-25 ENCOUNTER — Encounter: Payer: Self-pay | Admitting: Internal Medicine

## 2021-05-25 ENCOUNTER — Non-Acute Institutional Stay: Payer: Medicare PPO | Admitting: Internal Medicine

## 2021-05-25 ENCOUNTER — Other Ambulatory Visit: Payer: Self-pay

## 2021-05-25 VITALS — BP 146/76 | HR 80 | Temp 97.0°F | Resp 22

## 2021-05-25 DIAGNOSIS — L03818 Cellulitis of other sites: Secondary | ICD-10-CM | POA: Diagnosis not present

## 2021-05-25 MED ORDER — CEPHALEXIN 250 MG PO CAPS: 250.0000 mg | ORAL_CAPSULE | Freq: Four times a day (QID) | ORAL | 0 refills | Status: AC

## 2021-05-26 ENCOUNTER — Encounter: Payer: Self-pay | Admitting: Internal Medicine

## 2021-05-26 NOTE — Progress Notes (Signed)
[facility]: [facility] Service:  Clinic (12)  Provider:   Code Status: DNR Goals of Care:  Advanced Directives 03/09/2021  Does Patient Have a Medical Advance Directive? Yes  Type of Advance Directive Living will;Healthcare Power of Attorney  Does patient want to make changes to medical advance directive? No - Patient declined  Copy of Healthcare Power of Attorney in Chart? Yes - validated most recent copy scanned in chart (See row information)  Pre-existing out of facility DNR order (yellow form or pink MOST form) -     Chief Complaint  Patient presents with   Acute Visit    Patient returns to the clinic for an ear infection     HPI: Patient is a 87 y.o. female seen today for an acute visit for Swollen Ear Lobule  Patient noticed Swollen lower Ear lobule few days ago It is near where her Piercing is She tried Neomycin and Alcohol but it is getting worse Now red and ? Some discharge No Fever or chills  Her other issues  Has h/o Severe AS with AR Has decided not to undergo TAVR. Stays Asymptomatic Follows with Cardiology Depression and anxiety Stable Lost her husband year ago Chronic back pain Has seen neurosurgery before Alternating diarrhea and constipation questionable IBS.  Has seen GI. COPD HLD, hypertension    Past Medical History:  Diagnosis Date   Arthritis    COPD (chronic obstructive pulmonary disease) (HCC)    Depression    Diverticulosis    GERD (gastroesophageal reflux disease)    Hyperlipemia    Hypertension    Irregular heart beat 2014   PVCs   Moderate to severe aortic insufficiency 07/2019   Echo (03/08/2020): Aortic stenosis is now severe with mean gradient 60 mmHg. Aortic regurgitation is moderate to severe.   Osteoporosis    Severe aortic stenosis 06/11/2019   Echo 06/2019: EF 60-65%. Mild basal septal LVH, GR 2 DD. Mod LA dilation.- High LVEDP. Severe AI, Mod AS-peak gradient 43 mmHg, mean 28 mmHg;; 02/2020: Mean AoV  Gradient 60 mmHg with Mod AI. EF 70-755.  Mod LVH. Mod LA dilation. Mod MR.    Past Surgical History:  Procedure Laterality Date   ABDOMINAL HYSTERECTOMY     both appy and hyst done together   APPENDECTOMY     CATARACT EXTRACTION     TRANSTHORACIC ECHOCARDIOGRAM  06/2019   EF 60-65%. Mild basal septal LVH, GR 2 DD. Mod LA dilation.- High LVEDP. Severe AI, Mod AS-peak gradient 43 mmHg, mean 28 mmHg.Marland Kitchen   TRANSTHORACIC ECHOCARDIOGRAM  09/06/2020   EF 60-65%.  Normal LV fxn.  Mod Conc LVH of basal septal segment.  Indeterminate filling pattern (likely Gr2DD w/ Mod LA dilation).  Nl RV size and function.  Mildly increased PAP ~ 39.5 mmHg.  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  Mild-Mod TR.  Severe AoV Ca++ -> Mod-Severe AI & AS (mean gradient 33 mmHg) - ? lower b.c lower LVEF?    Allergies  Allergen Reactions   Fosamax [Alendronate Sodium]     Poor healing to mouth wound   Augmentin [Amoxicillin-Pot Clavulanate] Nausea Only    DID THE REACTION INVOLVE: Swelling of the face/tongue/throat, SOB, or low BP? No Sudden or severe rash/hives, skin peeling, or the inside of the mouth or nose? No Did it require medical treatment? No When did it last happen?       If all above  answers are "NO", may proceed with cephalosporin use.    Hydrocodone Nausea And Vomiting   Mobic [Meloxicam] Rash   Sulfonamide Derivatives Rash    Outpatient Encounter Medications as of 05/25/2021  Medication Sig   acetaminophen (TYLENOL) 650 MG CR tablet Take 650 mg by mouth in the morning, at noon, and at bedtime.    amLODipine (NORVASC) 2.5 MG tablet TAKE 1 TABLET BY MOUTH EVERY DAY   aspirin 81 MG tablet Take 81 mg by mouth daily.   BREO ELLIPTA 100-25 MCG/INH AEPB TAKE 1 PUFF BY MOUTH EVERY DAY   cephALEXin (KEFLEX) 250 MG capsule Take 1 capsule (250 mg total) by mouth 4 (four) times daily for 7 days.   lovastatin (MEVACOR) 40 MG tablet TAKE 1 TABLET BY MOUTH EVERY DAY   MELATONIN PO  Take 2 mg by mouth at bedtime as needed.   pantoprazole (PROTONIX) 20 MG tablet Take 20 mg by mouth daily as needed.   sertraline (ZOLOFT) 100 MG tablet TAKE 1 TABLET BY MOUTH EVERY DAY   SPIRIVA HANDIHALER 18 MCG inhalation capsule 1 CAPSULE BY INHALING THE CONTENTS OF THE CAPSULE USING THE HANDIHALER DEVICE ONCE A DAY INHALATION 90 DAYS   traMADol (ULTRAM) 50 MG tablet Take 1 tablet (50 mg total) by mouth every 6 (six) hours as needed for moderate pain.   valsartan-hydrochlorothiazide (DIOVAN-HCT) 320-12.5 MG tablet TAKE 1 TABLET BY MOUTH EVERY DAY   Vitamin D, Ergocalciferol, (DRISDOL) 1.25 MG (50000 UNIT) CAPS capsule TAKE 1 CAPSULE BY MOUTH ONE TIME PER WEEK   [DISCONTINUED] fluticasone (FLONASE) 50 MCG/ACT nasal spray Place 1 spray into both nostrils as needed.    No facility-administered encounter medications on file as of 05/25/2021.    Review of Systems:  Review of Systems  Constitutional: Negative.  Negative for chills and fever.  HENT:  Negative for ear pain and hearing loss.   Respiratory: Negative.    Gastrointestinal: Negative.   Genitourinary: Negative.   Skin:  Positive for color change and rash.  Neurological: Negative.   Psychiatric/Behavioral: Negative.    All other systems reviewed and are negative.  Health Maintenance  Topic Date Due   Zoster Vaccines- Shingrix (1 of 2) Never done   DEXA SCAN  Never done   INFLUENZA VACCINE  03/28/2021   TETANUS/TDAP  11/06/2022   COVID-19 Vaccine  Completed   HPV VACCINES  Aged Out    Physical Exam: Vitals:   05/26/21 1352  BP: (!) 146/76  Pulse: 80  Resp: (!) 22  Temp: (!) 97 F (36.1 C)   There is no height or weight on file to calculate BMI. Physical Exam Vitals reviewed.  Constitutional:      Appearance: Normal appearance.  HENT:     Head: Normocephalic.     Ears:     Comments: Lower part of Ear lobule is swollen and red Not Painful Did not notice any discharge    Nose: Nose normal.     Mouth/Throat:      Mouth: Mucous membranes are moist.     Pharynx: Oropharynx is clear.  Cardiovascular:     Rate and Rhythm: Normal rate.     Pulses: Normal pulses.     Heart sounds: Murmur heard.  Pulmonary:     Effort: Pulmonary effort is normal.     Breath sounds: Normal breath sounds.  Abdominal:     General: Abdomen is flat. Bowel sounds are normal.     Palpations: Abdomen is soft.  Musculoskeletal:  Cervical back: Neck supple.  Neurological:     General: No focal deficit present.     Mental Status: She is alert.    Labs reviewed: Basic Metabolic Panel: Recent Labs    11/04/20 0820  NA 135  K 4.3  CL 98  CO2 25  GLUCOSE 94  BUN 18  CREATININE 0.83  CALCIUM 9.0  TSH 1.46   Liver Function Tests: Recent Labs    11/04/20 0820  AST 16  ALT 10  BILITOT 0.6  PROT 6.5   No results for input(s): LIPASE, AMYLASE in the last 8760 hours. No results for input(s): AMMONIA in the last 8760 hours. CBC: Recent Labs    11/04/20 0820  WBC 5.9  NEUTROABS 3,623  HGB 12.4  HCT 36.8  MCV 91.8  PLT 221   Lipid Panel: Recent Labs    11/04/20 0820  CHOL 170  HDL 77  LDLCALC 78  TRIG 69  CHOLHDL 2.2   No results found for: HGBA1C  Procedures since last visit: No results found.  Assessment/Plan Cellulitits of the Ear lobule Keflex 250 mg for 7 days If not better call our office Stop Neomycin and Alcohal  Other issues  Chronic bilateral low back pain without sciatica Tylenol  for pain control Keeps Tramadol only for Severe Pain Using Dan Humphreys now and it is helping some Says she was seen By Dr Jule Ser before. No Surgery candidate 2. Severe aortic insufficiency and AS No Symptoms Follows with Cardiologist Decided against Surgery   3. Essential hypertension Stable on Diovan and Norvasc   4. Depression, recurrent (HCC) Symptoms are stable on Zoloft   5. Bilateral leg edema NO edema anymore Off lasix   6. Simple chronic bronchitis (HCC) Does not use any  inhalers   7. Osteoporosis, unspecified osteoporosis type, unspecified pathological fracture presence Received Reclast for 8 ers Does not want DEXA right now   8. Hyponatremia Had one episode 2 years ago Thought to be due to dehydration Repeat Sodium has been in Normal Limits Continues ot be on Zoloft and Diovan   9. Arthritis Tylenol    10. Other hyperlipidemia On statin      Labs/tests ordered:  * No order type specified * Next appt:  06/07/2021

## 2021-05-30 ENCOUNTER — Ambulatory Visit: Payer: Medicare PPO | Admitting: Cardiology

## 2021-06-07 ENCOUNTER — Ambulatory Visit: Payer: Medicare PPO | Admitting: Cardiology

## 2021-06-07 ENCOUNTER — Ambulatory Visit: Payer: Medicare PPO | Admitting: Nurse Practitioner

## 2021-06-10 ENCOUNTER — Telehealth: Payer: Self-pay | Admitting: *Deleted

## 2021-06-10 MED ORDER — CEPHALEXIN 250 MG PO CAPS: 250.0000 mg | ORAL_CAPSULE | Freq: Four times a day (QID) | ORAL | 0 refills | Status: AC

## 2021-06-10 NOTE — Telephone Encounter (Signed)
I called patient and reconfirm her symptoms. Her Ear did improve on Keflex but then flared up again Will do Keflex 250 mg QID for 7 days She has Appointment to see Amy on Tues.

## 2021-06-10 NOTE — Telephone Encounter (Signed)
Patient called and stated that her ear is still infected. Stated that she has completed her antibiotic Last Wednesday.   Stated that she had to wear earrings last weekend and it flared back up. Stated that there is NO redness but does have crusty discharge. Itches and swollen.   Patient has an appointment on 10/17 with Amy to follow up earlobe  Patient is wondering if she needs another round of Antibiotic.   Please Advise.

## 2021-06-13 ENCOUNTER — Encounter: Payer: Self-pay | Admitting: Orthopedic Surgery

## 2021-06-13 ENCOUNTER — Non-Acute Institutional Stay: Payer: Medicare PPO | Admitting: Orthopedic Surgery

## 2021-06-13 ENCOUNTER — Other Ambulatory Visit: Payer: Self-pay

## 2021-06-13 VITALS — BP 120/52 | HR 88 | Temp 98.9°F | Resp 18

## 2021-06-13 DIAGNOSIS — L03818 Cellulitis of other sites: Secondary | ICD-10-CM | POA: Diagnosis not present

## 2021-06-13 NOTE — Progress Notes (Addendum)
Careteam: Patient Care Team: Mahlon Gammon, MD as PCP - General (Internal Medicine) Marykay Lex, MD as PCP - Cardiology (Cardiology)  Seen by: Hazle Nordmann, AGNP-C  PLACE OF SERVICE:  [facility] Advanced Directive information    Allergies  Allergen Reactions   Fosamax [Alendronate Sodium]     Poor healing to mouth wound   Augmentin [Amoxicillin-Pot Clavulanate] Nausea Only    DID THE REACTION INVOLVE: Swelling of the face/tongue/throat, SOB, or low BP? No Sudden or severe rash/hives, skin peeling, or the inside of the mouth or nose? No Did it require medical treatment? No When did it last happen?       If all above answers are "NO", may proceed with cephalosporin use.    Hydrocodone Nausea And Vomiting   Mobic [Meloxicam] Rash   Sulfonamide Derivatives Rash    Chief Complaint  Patient presents with   Follow-up    Left earlobe follow-up     HPI: Patient is a 87 y.o. female seen today to follow up on left earlobe cellulitis.   She was seen 09/28 for left ear pain. Diagnosed with cellulitis to left ear lobe. She was prescribed Keflex x 7 days. 10/14 she contacted Dr. Chales Abrahams about returning redness and crusty drainage from left ear. Keflex was extended for another 7 days. She is taking antibiotic as prescribed, reports she has a few days left. Admits to wearing earrings once for her grandson's wedding. Earrings were 14k gold and gave her some irritation of itching and crust like drainage. Today she describes left ear as dry and itching at times. She denies increased redness, swelling, pain or warmth to left ear. Afebrile today. Vitals stable.   Review of Systems:  Review of Systems  Constitutional:  Negative for chills, fever, malaise/fatigue and weight loss.  HENT:  Negative for ear discharge and ear pain.   Respiratory:  Negative for cough, shortness of breath and wheezing.   Cardiovascular:  Negative for chest pain and leg swelling.  Skin:  Positive  for itching. Negative for rash.  Psychiatric/Behavioral:  Negative for depression. The patient is not nervous/anxious.    Past Medical History:  Diagnosis Date   Arthritis    COPD (chronic obstructive pulmonary disease) (HCC)    Depression    Diverticulosis    GERD (gastroesophageal reflux disease)    Hyperlipemia    Hypertension    Irregular heart beat 2014   PVCs   Moderate to severe aortic insufficiency 07/2019   Echo (03/08/2020): Aortic stenosis is now severe with mean gradient 60 mmHg. Aortic regurgitation is moderate to severe.   Osteoporosis    Severe aortic stenosis 06/11/2019   Echo 06/2019: EF 60-65%. Mild basal septal LVH, GR 2 DD. Mod LA dilation.- High LVEDP. Severe AI, Mod AS-peak gradient 43 mmHg, mean 28 mmHg;; 02/2020: Mean AoV Gradient 60 mmHg with Mod AI. EF 70-755.  Mod LVH. Mod LA dilation. Mod MR.   Past Surgical History:  Procedure Laterality Date   ABDOMINAL HYSTERECTOMY     both appy and hyst done together   APPENDECTOMY     CATARACT EXTRACTION     TRANSTHORACIC ECHOCARDIOGRAM  06/2019   EF 60-65%. Mild basal septal LVH, GR 2 DD. Mod LA dilation.- High LVEDP. Severe AI, Mod AS-peak gradient 43 mmHg, mean 28 mmHg.Marland Kitchen   TRANSTHORACIC ECHOCARDIOGRAM  09/06/2020   EF 60-65%.  Normal LV fxn.  Mod Conc LVH of basal septal segment.  Indeterminate filling pattern (likely Gr2DD  w/ Mod LA dilation).  Nl RV size and function.  Mildly increased PAP ~ 39.5 mmHg.  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  Mild-Mod TR.  Severe AoV Ca++ -> Mod-Severe AI & AS (mean gradient 33 mmHg) - ? lower b.c lower LVEF?   Social History:   reports that she quit smoking about 12 years ago. Her smoking use included cigarettes. She has a 30.00 pack-year smoking history. She has never used smokeless tobacco. She reports current alcohol use of about 14.0 standard drinks per week. She reports that she does not use drugs.  Family History  Problem Relation Age of  Onset   Lung cancer Father    Cancer Sister        biliary   Hypertension Mother    Cancer Sister     Medications: Patient's Medications  New Prescriptions   No medications on file  Previous Medications   ACETAMINOPHEN (TYLENOL) 650 MG CR TABLET    Take 650 mg by mouth in the morning, at noon, and at bedtime.    AMLODIPINE (NORVASC) 2.5 MG TABLET    TAKE 1 TABLET BY MOUTH EVERY DAY   ASPIRIN 81 MG TABLET    Take 81 mg by mouth daily.   BREO ELLIPTA 100-25 MCG/INH AEPB    TAKE 1 PUFF BY MOUTH EVERY DAY   CEPHALEXIN (KEFLEX) 250 MG CAPSULE    Take 1 capsule (250 mg total) by mouth 4 (four) times daily for 7 days.   LOVASTATIN (MEVACOR) 40 MG TABLET    TAKE 1 TABLET BY MOUTH EVERY DAY   MELATONIN PO    Take 2 mg by mouth at bedtime as needed.   PANTOPRAZOLE (PROTONIX) 20 MG TABLET    Take 20 mg by mouth daily as needed.   SERTRALINE (ZOLOFT) 100 MG TABLET    TAKE 1 TABLET BY MOUTH EVERY DAY   SPIRIVA HANDIHALER 18 MCG INHALATION CAPSULE    1 CAPSULE BY INHALING THE CONTENTS OF THE CAPSULE USING THE HANDIHALER DEVICE ONCE A DAY INHALATION 90 DAYS   TRAMADOL (ULTRAM) 50 MG TABLET    Take 1 tablet (50 mg total) by mouth every 6 (six) hours as needed for moderate pain.   VALSARTAN-HYDROCHLOROTHIAZIDE (DIOVAN-HCT) 320-12.5 MG TABLET    TAKE 1 TABLET BY MOUTH EVERY DAY   VITAMIN D, ERGOCALCIFEROL, (DRISDOL) 1.25 MG (50000 UNIT) CAPS CAPSULE    TAKE 1 CAPSULE BY MOUTH ONE TIME PER WEEK  Modified Medications   No medications on file  Discontinued Medications   No medications on file    Physical Exam:  There were no vitals filed for this visit. There is no height or weight on file to calculate BMI. Wt Readings from Last 3 Encounters:  03/09/21 134 lb (60.8 kg)  11/10/20 131 lb 6.4 oz (59.6 kg)  11/01/20 132 lb (59.9 kg)    Physical Exam Vitals reviewed.  Constitutional:      General: She is not in acute distress. HENT:     Head: Normocephalic.     Right Ear: Hearing normal. No  drainage, swelling or tenderness.     Left Ear: Hearing normal. No drainage, swelling or tenderness.     Ears:     Comments: Left outer ear lobe with dry flaking skin, no warmth/redness/ or swelling noted.  Cardiovascular:     Rate and Rhythm: Normal rate and regular rhythm.     Pulses: Normal pulses.  Heart sounds: Murmur heard.  Pulmonary:     Effort: Pulmonary effort is normal. No respiratory distress.     Breath sounds: Normal breath sounds. No wheezing.  Skin:    General: Skin is warm and dry.     Capillary Refill: Capillary refill takes less than 2 seconds.  Neurological:     General: No focal deficit present.     Mental Status: She is alert and oriented to person, place, and time.     Motor: Weakness present.     Gait: Gait abnormal.     Comments: walker  Psychiatric:        Mood and Affect: Mood normal.        Behavior: Behavior normal.    Labs reviewed: Basic Metabolic Panel: Recent Labs    11/04/20 0820  NA 135  K 4.3  CL 98  CO2 25  GLUCOSE 94  BUN 18  CREATININE 0.83  CALCIUM 9.0  TSH 1.46   Liver Function Tests: Recent Labs    11/04/20 0820  AST 16  ALT 10  BILITOT 0.6  PROT 6.5   No results for input(s): LIPASE, AMYLASE in the last 8760 hours. No results for input(s): AMMONIA in the last 8760 hours. CBC: Recent Labs    11/04/20 0820  WBC 5.9  NEUTROABS 3,623  HGB 12.4  HCT 36.8  MCV 91.8  PLT 221   Lipid Panel: Recent Labs    11/04/20 0820  CHOL 170  HDL 77  LDLCALC 78  TRIG 69  CHOLHDL 2.2   TSH: Recent Labs    11/04/20 0820  TSH 1.46   A1C: No results found for: HGBA1C   Assessment/Plan 1. Cellulitits of the Ear lobule - resolved with Keflex x 7 days - 10/14 keflex extended for another week due to symptoms of redness and crust drainage - left ear lobe with some dry flaking skin - recommend using hypoallergenic moisturizer to left ear- apply after bathing - some irritation when wearing earrings for a few hours-  metal allegry?? - advised to clean earrings before application - avoid using rubbing alcohol to ears - recommend trying to wear earrings in 1 week  Total time: 14 minutes. Greater than 50% of total time spent doing patient education on symptom management regarding ear hygiene and health.   Next appt: none Ashwin Tibbs Scherry Ran  [facility] [phone]

## 2021-06-13 NOTE — Patient Instructions (Addendum)
May wear earrings in a week- remove if irritation starts (may be allergy to specific metal)  Keep outer ears moist with hypoallergenic moisturizer- apply to ears after bathing  Avoid using rubbing alcohol to ears  Contact provider if increased warmth/redness/pain or swelling occur

## 2021-07-18 ENCOUNTER — Other Ambulatory Visit: Payer: Self-pay | Admitting: Internal Medicine

## 2021-07-28 ENCOUNTER — Ambulatory Visit
Admission: RE | Admit: 2021-07-28 | Discharge: 2021-07-28 | Disposition: A | Discharge status: Home / Self Care | Payer: Medicare PPO | Source: Ambulatory Visit | Attending: Nurse Practitioner | Admitting: Nurse Practitioner

## 2021-07-28 ENCOUNTER — Other Ambulatory Visit: Payer: Self-pay

## 2021-07-28 DIAGNOSIS — E2839 Other primary ovarian failure: Secondary | ICD-10-CM

## 2021-07-28 DIAGNOSIS — M810 Age-related osteoporosis without current pathological fracture: Secondary | ICD-10-CM | POA: Diagnosis not present

## 2021-07-28 DIAGNOSIS — Z780 Asymptomatic menopausal state: Secondary | ICD-10-CM | POA: Diagnosis not present

## 2021-07-28 DIAGNOSIS — M85851 Other specified disorders of bone density and structure, right thigh: Secondary | ICD-10-CM | POA: Diagnosis not present

## 2021-08-01 NOTE — Progress Notes (Deleted)
Cardiology Office Note:    Date:  08/01/2021   ID:  Robin [name], Robin [name] 1932-03-15, MRN [id number]  PCP:  Mahlon Gammon, MD  Cardiologist:  Bryan Lemma, MD  Electrophysiologist:  None   Referring MD: Mahlon Gammon, MD   Chief Complaint: follow-up of valvular disease  History of Present Illness:    Robin [name] is a 87 y.o. female with a history of moderate to severe aortic stenosis/insufficiency, moderate to severe mitral stenosis/regurgitation, PVCs, hypertension, hyperlipidemia, COPD, and depression who is followed by Dr. Herbie Baltimore and presents today for routine follow-up of valvular disease.   Patient was previously followed by Dr. Donnie Aho for valvular disease but now follows with Dr. Herbie Baltimore. He has known aortic and mitral valve stenosis and regurgitation. Most recent Echo in 08/2020 showed LVEF of 60-65% with normal wall motion, moderate LVH of the basal-septal segment, moderate to severe aortic stenosis/regurgitation as well as a myxomatous mitral valve with mild to moderate mitral regurgitation/stenosis. Aortic stenosis was considered severe on prior Echo in 02/2020. Patient was last seen by Dr. Herbie Baltimore in 10/2020 at which time she was doing well from a cardiac standpoint. She reported at that time that she had decided that she did not want to move forward with TAVR evaluation and that she did not want any invasive procedures.   Patient presents today for routine follow-up. ***  Moderate to Severe Aortic Stenosis/Regurgitation  Moderate to Severe Mitral Stenosis/Regurgitation  Patient has been followed for aortic and mitral valve disease for several years now. Last Echo in 08/2020 showed LVEF of 60-65% with normal wall motion, moderate LVH of the basal-septal segment, moderate to severe aortic stenosis/regurgitation as well as a myxomatous mitral valve with mild to moderate mitral regurgitation/stenosis. Aortic stenosis was considered severe on prior Echo in 02/2020. AV mean gradient  60.0 in 02/2020 but improved to 33.0 mmHg in 08/2020. Patient has previously been seen by Dr. Clifton James for consideration of TAVR but has decided that she does not want to move forward with this. - No significant symptoms.  - Will continue to monitor for symptoms. Given patient does not want to have any invasive surgeries, will not order repeat Echo unless patient has concerning symptoms. ***  PVCs Prior monitor in 2014 showed PVCs. She has not tolerated a beta-blocker in the past due to fatigue.  - ***  Hypertension BP well controlled.  - Continue current medications: Amlodipine 2.5mg  daily and Valsartan-HCTZ 320-12.5mg  daily.  Hyperlipidemia Lipid panel in 10/2020: Total Cholesterol 170, Triglycerides 69, HDL 77, LDL 78.  - Continue Lovastatin 40mg  daily. - Followed by PCP.  Past Medical History:  Diagnosis Date   Arthritis    COPD (chronic obstructive pulmonary disease) (HCC)    Depression    Diverticulosis    GERD (gastroesophageal reflux disease)    Hyperlipemia    Hypertension    Irregular heart beat 2014   PVCs   Moderate to severe aortic insufficiency 07/2019   Echo (03/08/2020): Aortic stenosis is now severe with mean gradient 60 mmHg. Aortic regurgitation is moderate to severe.   Osteoporosis    Severe aortic stenosis 06/11/2019   Echo 06/2019: EF 60-65%. Mild basal septal LVH, GR 2 DD. Mod LA dilation.- High LVEDP. Severe AI, Mod AS-peak gradient 43 mmHg, mean 28 mmHg;; 02/2020: Mean AoV Gradient 60 mmHg with Mod AI. EF 70-755.  Mod LVH. Mod LA dilation. Mod MR.    Past Surgical History:  Procedure Laterality Date   ABDOMINAL HYSTERECTOMY  both appy and hyst done together   APPENDECTOMY     CATARACT EXTRACTION     TRANSTHORACIC ECHOCARDIOGRAM  06/2019   EF 60-65%. Mild basal septal LVH, GR 2 DD. Mod LA dilation.- High LVEDP. Severe AI, Mod AS-peak gradient 43 mmHg, mean 28 mmHg.Marland Kitchen   TRANSTHORACIC ECHOCARDIOGRAM  09/06/2020   EF 60-65%.  Normal LV fxn.  Mod Conc LVH  of basal septal segment.  Indeterminate filling pattern (likely Gr2DD w/ Mod LA dilation).  Nl RV size and function.  Mildly increased PAP ~ 39.5 mmHg.  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  Mild-Mod TR.  Severe AoV Ca++ -> Mod-Severe AI & AS (mean gradient 33 mmHg) - ? lower b.c lower LVEF?    Current Medications: No outpatient medications have been marked as taking for the 08/09/21 encounter (Appointment) with Corrin Parker, PA-C.     Allergies:   Fosamax [alendronate sodium], Augmentin [amoxicillin-pot clavulanate], Hydrocodone, Mobic [meloxicam], and Sulfonamide derivatives   Social History   Socioeconomic History   Marital status: Married    Spouse name: Not on file   Number of children: 2   Years of education: Not on file   Highest education level: Not on file  Occupational History   Occupation: retired-schoolteacher    Comment: Teacher  Tobacco Use   Smoking status: Former    Packs/day: 1.00    Years: 30.00    Pack years: 30.00    Types: Cigarettes    Quit date: 08/28/2008    Years since quitting: 12.9   Smokeless tobacco: Never  Vaping Use   Vaping Use: Never used  Substance and Sexual Activity   Alcohol use: Yes    Alcohol/week: 14.0 standard drinks    Types: 14 Standard drinks or equivalent per week    Comment: 1-2 glass of wine or mixed drink daily   Drug use: No   Sexual activity: Not on file  Other Topics Concern   Not on file  Social History Narrative   Currently remarried.  New husband has significant cardiac history.  (Followed by Dr. Herbie Baltimore)   She is a retired Runner, broadcasting/film/video.      She and her new husband [name]") moved to [facility] just prior to the outbreak of the pandemic in the Spring of 2020.    [name] - [name]) died Feb 11, 2020 -> longstanding Parkinson's disease complicated by dementia.      --> Now finally able to work with PT/OT to rehab - chronic back pain with very unsteady gait/poor balance. -- Uses cane  for short distance - but mostly uses Walker.  Really only walks to & from the cafeteria if not working with therapy.She does have a pretty unsteady gait and has to either use a cane for short distances or uses a walker for longer distance. -->This is really because of her bad back and poor balance.  As result of this, she really is not all that active, and is very upset about not being on to do her rehab.   Social Determinants of Health   Financial Resource Strain: Not on file  Food Insecurity: Not on file  Transportation Needs: Not on file  Physical Activity: Not on file  Stress: Not on file  Social Connections: Not on file     Family History: The patient's family history includes Cancer in her sister and sister; Hypertension in her mother; Lung cancer in her father.  ROS:  Please see the history of present illness.     EKGs/Labs/Other Studies Reviewed:    The following studies were reviewed today:  Echocardiogram 09/06/2020: Impressions:  1. Left ventricular ejection fraction, by estimation, is 60 to 65%. The  left ventricle has normal function. The left ventricle has no regional  wall motion abnormalities. There is moderate concentric left ventricular  hypertrophy of the basal-septal  segment. Indeterminate diastolic filling due to E-A fusion.   2. Right ventricular systolic function is normal. The right ventricular  size is normal. There is mildly elevated pulmonary artery systolic  pressure. The estimated right ventricular systolic pressure is 39.5 mmHg.   3. Left atrial size was moderately dilated.   4. The mitral valve is myxomatous. Mild to moderate mitral valve  regurgitation. Mild to moderate mitral stenosis. The mean mitral valve  gradient is 7.0 mmHg with average heart rate of 85 bpm.   5. Tricuspid valve regurgitation is mild to moderate.   6. The aortic valve is calcified. There is severe thickening of the  aortic valve. Aortic valve regurgitation is moderate to  severe. Moderate  to severe aortic valve stenosis.   7. The inferior vena cava is normal in size with greater than 50%  respiratory variability, suggesting right atrial pressure of 3 mmHg.   Comparison(s): A prior study was performed on 03/08/2020. With decrease in left ventricular function, slight decrease in aortic valve gradients. Significant aortic regurgitation noted.    EKG:  EKG not ordered today.   Recent Labs: 11/04/2020: ALT 10; BUN 18; Creat 0.83; Hemoglobin 12.4; Platelets 221; Potassium 4.3; Sodium 135; TSH 1.46  Recent Lipid Panel    Component Value Date/Time   CHOL 170 11/04/2020 0820   TRIG 69 11/04/2020 0820   HDL 77 11/04/2020 0820   CHOLHDL 2.2 11/04/2020 0820   LDLCALC 78 11/04/2020 0820    Physical Exam:    Vital Signs: There were no vitals taken for this visit.    Wt Readings from Last 3 Encounters:  03/09/21 134 lb (60.8 kg)  11/10/20 131 lb 6.4 oz (59.6 kg)  11/01/20 132 lb (59.9 kg)     General: 87 y.o. female in no acute distress. HEENT: Normocephalic and atraumatic. Sclera clear. EOMs intact. Neck: Supple. No carotid bruits. No JVD. Heart: *** RRR. Distinct S1 and S2. No murmurs, gallops, or rubs. Radial and distal pedal pulses 2+ and equal bilaterally. Lungs: No increased work of breathing. Clear to ausculation bilaterally. No wheezes, rhonchi, or rales.  Abdomen: Soft, non-distended, and non-tender to palpation. Bowel sounds present in all 4 quadrants.  MSK: Normal strength and tone for age. *** Extremities: No lower extremity edema.    Skin: Warm and dry. Neuro: Alert and oriented x3. No focal deficits. Psych: Normal affect. Responds appropriately.   Assessment:    No diagnosis found.  Plan:     Disposition: Follow up in ***   Medication Adjustments/Labs and Tests Ordered: Current medicines are reviewed at length with the patient today.  Concerns regarding medicines are outlined above.  No orders of the defined types were placed in  this encounter.  No orders of the defined types were placed in this encounter.   There are no Patient Instructions on file for this visit.   Signed, Corrin Parker, PA-C  08/01/2021 12:45 PM    [facility]

## 2021-08-08 NOTE — Progress Notes (Addendum)
Office Visit    Patient Name: Robin [name] Date of Encounter: 08/09/2021  PCP:  Mahlon Gammon, MD   [facility]  Cardiologist:  Bryan Lemma, MD  Advanced Practice Provider:  No care team member to display Electrophysiologist:  None    Chief Complaint    Robin [name] is a 87 y.o. female with a hx of moderate to severe AI, severe AAS, moderate MR, GERD, hyperlipidemia, hypertension, irregular heartbeat, COPD, depression presents today for 72-month follow-up appointment.  Past Medical History    Past Medical History:  Diagnosis Date   Arthritis    COPD (chronic obstructive pulmonary disease) (HCC)    Depression    Diverticulosis    GERD (gastroesophageal reflux disease)    Hyperlipemia    Hypertension    Irregular heart beat 2014   PVCs   Moderate to severe aortic insufficiency 07/2019   Echo (03/08/2020): Aortic stenosis is now severe with mean gradient 60 mmHg. Aortic regurgitation is moderate to severe.   Osteoporosis    Severe aortic stenosis 06/11/2019   Echo 06/2019: EF 60-65%. Mild basal septal LVH, GR 2 DD. Mod LA dilation.- High LVEDP. Severe AI, Mod AS-peak gradient 43 mmHg, mean 28 mmHg;; 02/2020: Mean AoV Gradient 60 mmHg with Mod AI. EF 70-755.  Mod LVH. Mod LA dilation. Mod MR.   Past Surgical History:  Procedure Laterality Date   ABDOMINAL HYSTERECTOMY     both appy and hyst done together   APPENDECTOMY     CATARACT EXTRACTION     TRANSTHORACIC ECHOCARDIOGRAM  06/2019   EF 60-65%. Mild basal septal LVH, GR 2 DD. Mod LA dilation.- High LVEDP. Severe AI, Mod AS-peak gradient 43 mmHg, mean 28 mmHg.Marland Kitchen   TRANSTHORACIC ECHOCARDIOGRAM  09/06/2020   EF 60-65%.  Normal LV fxn.  Mod Conc LVH of basal septal segment.  Indeterminate filling pattern (likely Gr2DD w/ Mod LA dilation).  Nl RV size and function.  Mildly increased PAP ~ 39.5 mmHg.  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  Mild-Mod TR.  Severe AoV  Ca++ -> Mod-Severe AI & AS (mean gradient 33 mmHg) - ? lower b.c lower LVEF?    Allergies  Allergies  Allergen Reactions   Fosamax [Alendronate Sodium]     Poor healing to mouth wound   Augmentin [Amoxicillin-Pot Clavulanate] Nausea Only    DID THE REACTION INVOLVE: Swelling of the face/tongue/throat, SOB, or low BP? No Sudden or severe rash/hives, skin peeling, or the inside of the mouth or nose? No Did it require medical treatment? No When did it last happen?       If all above answers are "NO", may proceed with cephalosporin use.    Hydrocodone Nausea And Vomiting   Mobic [Meloxicam] Rash   Sulfonamide Derivatives Rash    History of Present Illness    Robin [name] is a 87 y.o. female with a hx of hx of moderate to severe AI, severe AAS, moderate MR, GERD, hyperlipidemia, hypertension, irregular heartbeat, COPD, depression presents today for 62-month follow-up appointment.she was last seen March 2022 by Dr. Herbie Baltimore.  At her last appointment, she was noted to have moderate to severe aortic insufficiency.  She was not overly symptomatic at that time.  She does not want a TAVR.  Therefore no further evaluation was pursued.  She was tolerating her 12.5 mg of HCTZ.  Her blood pressure was well controlled at that appointment and  she remained on low-dose amlodipine along with high-dose valsartan and HCTZ.  She does have a history of PVCs.  She was asymptomatic and did not tolerate a beta-blocker due to fatigue.  She was also noted to have a myxomatous mitral valve with mild to moderate mitral valve insufficiency.  She had no real prolapse.  She did have moderate stenosis.  The fact that she has aortic valve disease in addition to mitral valve disease would suggest that it is probably rheumatic in nature but we cannot confirm for sure.  She does not want any invasive procedures.  At that time she acknowledges her age and her lack of mobility at baseline.  She was not really able to walk all that  much but is able to do some activity.  She denies pain or pressure with rest or exertion.  She had no PND, orthopnea, she does have a little exertional dyspnea.  No irregular or rapid heartbeats.  Today, she has been doing well from a cardiac standpoint.  She does occasionally have shortness of breath with activity but she also has COPD and takes Spiriva.  She stopped taking Breo because she felt like she did not need it.  She has not had any syncope or near syncope episodes.  She is somewhat limited in her activity due to her back pain which is her biggest issue right now.  She does take Tylenol for arthritis and it does seem to help.  She is scheduled to get some labs drawn with her primary care provider in a few weeks and I think it would be fine to just add on a lipid panel.  The patient is aware that she needs to be fasting.  Her blood pressure is well controlled in the clinic today 124/60.  She states that its been well controlled at home.  She continues to take her Norvasc 2.5 mg and her Diovan without any issues.  She gets these medications refilled by her primary care.  We discussed the TAVR procedure for her severe aortic stenosis.  She is still not interested in undergoing TAVR or any other intervention at this time.  She is asymptomatic from a cardiac standpoint other than some shortness of breath with activity.  She has had no palpitations or fluttering in her chest.  Reports no shortness of breath at rest. Reports no chest pain, pressure, or tightness. No edema, orthopnea, PND. Reports no palpitations.     EKGs/Labs/Other Studies Reviewed:   The following studies were reviewed today:  Echocardiogram performed 08/2020 showed estimated left ventricular ejection fraction of 60 to 65%.  Left atrial size was moderately dilated.  The mitral valve is myxomatous.  Mild to moderate mitral valve regurgitation.  Mild to moderate mitral stenosis.  The mean mitral valve gradient is 7.0 mmHg with average  heart rate of 85 bpm.  Tricuspid valve regurgitation is mild to moderate.  Aortic valve is calcified.  There is severe thickening of the aortic valve.  Aortic valve regurgitation is moderate to severe.  Moderate to severe aortic valve stenosis.  EKG: no EKG ordered today  Recent Labs: 11/04/2020: ALT 10; BUN 18; Creat 0.83; Hemoglobin 12.4; Platelets 221; Potassium 4.3; Sodium 135; TSH 1.46  Recent Lipid Panel    Component Value Date/Time   CHOL 170 11/04/2020 0820   TRIG 69 11/04/2020 0820   HDL 77 11/04/2020 0820   CHOLHDL 2.2 11/04/2020 0820   LDLCALC 78 11/04/2020 0820     Home Medications   Current  Meds  Medication Sig   acetaminophen (TYLENOL) 650 MG CR tablet Take 650 mg by mouth in the morning, at noon, and at bedtime.    amLODipine (NORVASC) 2.5 MG tablet TAKE 1 TABLET BY MOUTH EVERY DAY   aspirin 81 MG tablet Take 81 mg by mouth daily.   BREO ELLIPTA 100-25 MCG/INH AEPB TAKE 1 PUFF BY MOUTH EVERY DAY   lovastatin (MEVACOR) 40 MG tablet TAKE 1 TABLET BY MOUTH EVERY DAY   MELATONIN PO Take 2 mg by mouth at bedtime as needed.   pantoprazole (PROTONIX) 20 MG tablet Take 20 mg by mouth daily as needed.   sertraline (ZOLOFT) 100 MG tablet TAKE 1 TABLET BY MOUTH EVERY DAY   SPIRIVA HANDIHALER 18 MCG inhalation capsule 1 CAPSULE BY INHALING THE CONTENTS OF THE CAPSULE USING THE HANDIHALER DEVICE ONCE A DAY INHALATION 90 DAYS   traMADol (ULTRAM) 50 MG tablet Take 1 tablet (50 mg total) by mouth every 6 (six) hours as needed for moderate pain.   valsartan-hydrochlorothiazide (DIOVAN-HCT) 320-12.5 MG tablet TAKE 1 TABLET BY MOUTH EVERY DAY   Vitamin D, Ergocalciferol, (DRISDOL) 1.25 MG (50000 UNIT) CAPS capsule TAKE 1 CAPSULE BY MOUTH ONE TIME PER WEEK     Review of Systems      All other systems reviewed and are otherwise negative except as noted above.  Physical Exam    VS:  BP 124/60   Pulse 74   Ht 5\' 2"  (1.575 m)   Wt 133 lb (60.3 kg)   SpO2 95%   BMI 24.33 kg/m  ,  BMI Body mass index is 24.33 kg/m.  Wt Readings from Last 3 Encounters:  08/09/21 133 lb (60.3 kg)  03/09/21 134 lb (60.8 kg)  11/10/20 131 lb 6.4 oz (59.6 kg)     GEN: Well nourished, well developed, in no acute distress. HEENT: normal. Neck: Supple, no JVD, carotid bruits, or masses. Cardiac: RRR,  + systolic murmur, rubs, or gallops. No clubbing, cyanosis, edema.  Radials/PT 2+ and equal bilaterally.  Respiratory:  Respirations regular and unlabored, clear to auscultation bilaterally. GI: Soft, nontender, nondistended. MS: No deformity or atrophy. Skin: Warm and dry, no rash. Neuro:  Strength and sensation are intact. Psych: Normal affect.  Assessment & Plan    Severe chronic aortic insufficiency -Does not want TAVR -Continue to monitor symptoms closely and optimize medications -Goal to avoid hypotension.  Continue Diovan -She was asking about a yearly echo so this was ordered today  Hypertension -Well-controlled on exam today -Continue low-dose amlodipine along with high-dose valsartan and HCTZ -Continue to monitor blood pressure at home -If your systolic is less than 100 or greater than 140 please call our office  Hyperlipidemia -Tolerating lovastatin -Lipid panel back in March 2022 showed HDL 77, LDL 78, total cholesterol April 2022, and triglycerides 69 -Annual labs for lipids and LFTs, she can do this through her PCP  PVCs -She is not currently having any palpitations  Mitral valve insufficiency/stenosis -Myxomatous mitral valve with no real prolapse. -She does not want to pursue any surgical intervention at this time -Symptoms remain well controlled on her current medical regimen  COPD -Continue Spiriva, she is not taking Breo -Continue follow-up with your PCP or pulmonologist -She does get SOB with activity. No SOB with sitting.   Disposition: Follow up in 6 month(s) with 093, MD or APP.  Signed, Bryan Lemma, PA-C 08/09/2021, 11:42 AM [facility]

## 2021-08-09 ENCOUNTER — Other Ambulatory Visit: Payer: Self-pay

## 2021-08-09 ENCOUNTER — Ambulatory Visit: Payer: Medicare PPO | Admitting: Physician Assistant

## 2021-08-09 ENCOUNTER — Encounter: Payer: Self-pay | Admitting: Student

## 2021-08-09 VITALS — BP 124/60 | HR 74 | Ht 62.0 in | Wt 133.0 lb

## 2021-08-09 DIAGNOSIS — I350 Nonrheumatic aortic (valve) stenosis: Secondary | ICD-10-CM

## 2021-08-09 DIAGNOSIS — I340 Nonrheumatic mitral (valve) insufficiency: Secondary | ICD-10-CM | POA: Diagnosis not present

## 2021-08-09 DIAGNOSIS — E782 Mixed hyperlipidemia: Secondary | ICD-10-CM

## 2021-08-09 DIAGNOSIS — I351 Nonrheumatic aortic (valve) insufficiency: Secondary | ICD-10-CM | POA: Diagnosis not present

## 2021-08-09 DIAGNOSIS — I10 Essential (primary) hypertension: Secondary | ICD-10-CM | POA: Diagnosis not present

## 2021-08-09 DIAGNOSIS — I493 Ventricular premature depolarization: Secondary | ICD-10-CM

## 2021-08-09 NOTE — Patient Instructions (Signed)
Medication Instructions:  No Changes *If you need a refill on your cardiac medications before your next appointment, please call your pharmacy*   Lab Work: No Labs If you have labs (blood work) drawn today and your tests are completely normal, you will receive your results only by: MyChart Message (if you have MyChart) OR A paper copy in the mail If you have any lab test that is abnormal or we need to change your treatment, we will call you to review the results.   Testing/Procedures: [address], [address] Your physician has requested that you have an echocardiogram. Echocardiography is a painless test that uses sound waves to create images of your heart. It provides your doctor with information about the size and shape of your heart and how well your hearts chambers and valves are working. This procedure takes approximately one hour. There are no restrictions for this procedure.    Follow-Up: At [facility], you and your health needs are our priority.  As part of our continuing mission to provide you with exceptional heart care, we have created designated Provider Care Teams.  These Care Teams include your primary Cardiologist (physician) and Advanced Practice Providers (APPs -  Physician Assistants and Nurse Practitioners) who all work together to provide you with the care you need, when you need it.  We recommend signing up for the patient portal called "MyChart".  Sign up information is provided on this After Visit Summary.  MyChart is used to connect with patients for Virtual Visits (Telemedicine).  Patients are able to view lab/test results, encounter notes, upcoming appointments, etc.  Non-urgent messages can be sent to your provider as well.   To learn more about what you can do with MyChart, go to [email or web address].    Your next appointment:   6 month(s)  The format for your next appointment:   In Person  Provider:   Bryan Lemma, MD

## 2021-08-19 ENCOUNTER — Other Ambulatory Visit: Payer: Self-pay | Admitting: Internal Medicine

## 2021-09-01 ENCOUNTER — Other Ambulatory Visit: Payer: Self-pay

## 2021-09-01 DIAGNOSIS — I10 Essential (primary) hypertension: Secondary | ICD-10-CM

## 2021-09-02 LAB — COMPLETE METABOLIC PANEL WITH GFR
AG Ratio: 1.7 (calc) (ref 1.0–2.5)
ALT: 11 U/L (ref 6–29)
AST: 16 U/L (ref 10–35)
Albumin: 4.3 g/dL (ref 3.6–5.1)
Alkaline phosphatase (APISO): 72 U/L (ref 37–153)
BUN: 22 mg/dL (ref 7–25)
CO2: 31 mmol/L (ref 20–32)
Calcium: 9.4 mg/dL (ref 8.6–10.4)
Chloride: 95 mmol/L — ABNORMAL LOW (ref 98–110)
Creat: 0.89 mg/dL (ref 0.60–0.95)
Globulin: 2.5 g/dL (calc) (ref 1.9–3.7)
Glucose, Bld: 90 mg/dL (ref 65–99)
Potassium: 4.3 mmol/L (ref 3.5–5.3)
Sodium: 132 mmol/L — ABNORMAL LOW (ref 135–146)
Total Bilirubin: 0.6 mg/dL (ref 0.2–1.2)
Total Protein: 6.8 g/dL (ref 6.1–8.1)
eGFR: 63 mL/min/{1.73_m2} (ref >=60)

## 2021-09-02 LAB — LIPID PANEL
Cholesterol: 173 mg/dL (ref <200)
HDL: 75 mg/dL (ref >=50)
LDL Cholesterol (Calc): 84 mg/dL (calc)
Non-HDL Cholesterol (Calc): 98 mg/dL (calc) (ref <130)
Total CHOL/HDL Ratio: 2.3 (calc) (ref <5.0)
Triglycerides: 61 mg/dL (ref <150)

## 2021-09-02 LAB — CBC WITH DIFFERENTIAL/PLATELET
Absolute Monocytes: 400 cells/uL (ref 200–950)
Basophils Absolute: 58 cells/uL (ref 0–200)
Basophils Relative: 1 %
Eosinophils Absolute: 110 cells/uL (ref 15–500)
Eosinophils Relative: 1.9 %
HCT: 35.6 % (ref 35.0–45.0)
Hemoglobin: 12 g/dL (ref 11.7–15.5)
Lymphs Abs: 1473 cells/uL (ref 850–3900)
MCH: 30 pg (ref 27.0–33.0)
MCHC: 33.7 g/dL (ref 32.0–36.0)
MCV: 89 fL (ref 80.0–100.0)
MPV: 9.3 fL (ref 7.5–12.5)
Monocytes Relative: 6.9 %
Neutro Abs: 3758 cells/uL (ref 1500–7800)
Neutrophils Relative %: 64.8 %
Platelets: 238 10*3/uL (ref 140–400)
RBC: 4 10*6/uL (ref 3.80–5.10)
RDW: 12.6 % (ref 11.0–15.0)
Total Lymphocyte: 25.4 %
WBC: 5.8 10*3/uL (ref 3.8–10.8)

## 2021-09-02 LAB — TSH: TSH: 1.23 mIU/L (ref 0.40–4.50)

## 2021-09-07 ENCOUNTER — Encounter: Payer: Self-pay | Admitting: Internal Medicine

## 2021-09-07 ENCOUNTER — Other Ambulatory Visit: Payer: Self-pay

## 2021-09-07 ENCOUNTER — Non-Acute Institutional Stay: Payer: Medicare PPO | Admitting: Internal Medicine

## 2021-09-07 VITALS — BP 117/59 | HR 84 | Temp 97.5°F | Ht 62.0 in | Wt 134.0 lb

## 2021-09-07 DIAGNOSIS — E871 Hypo-osmolality and hyponatremia: Secondary | ICD-10-CM

## 2021-09-07 DIAGNOSIS — I10 Essential (primary) hypertension: Secondary | ICD-10-CM | POA: Diagnosis not present

## 2021-09-07 DIAGNOSIS — J410 Simple chronic bronchitis: Secondary | ICD-10-CM

## 2021-09-07 DIAGNOSIS — F339 Major depressive disorder, recurrent, unspecified: Secondary | ICD-10-CM

## 2021-09-07 DIAGNOSIS — M545 Low back pain, unspecified: Secondary | ICD-10-CM

## 2021-09-07 DIAGNOSIS — I351 Nonrheumatic aortic (valve) insufficiency: Secondary | ICD-10-CM | POA: Diagnosis not present

## 2021-09-07 DIAGNOSIS — G8929 Other chronic pain: Secondary | ICD-10-CM

## 2021-09-07 DIAGNOSIS — M810 Age-related osteoporosis without current pathological fracture: Secondary | ICD-10-CM

## 2021-09-07 DIAGNOSIS — R600 Localized edema: Secondary | ICD-10-CM

## 2021-09-07 DIAGNOSIS — E7849 Other hyperlipidemia: Secondary | ICD-10-CM

## 2021-09-09 ENCOUNTER — Ambulatory Visit (HOSPITAL_COMMUNITY): Payer: Medicare PPO | Attending: Cardiovascular Disease

## 2021-09-09 ENCOUNTER — Other Ambulatory Visit: Payer: Self-pay

## 2021-09-09 DIAGNOSIS — I10 Essential (primary) hypertension: Secondary | ICD-10-CM

## 2021-09-09 DIAGNOSIS — I340 Nonrheumatic mitral (valve) insufficiency: Secondary | ICD-10-CM | POA: Diagnosis not present

## 2021-09-09 DIAGNOSIS — I351 Nonrheumatic aortic (valve) insufficiency: Secondary | ICD-10-CM

## 2021-09-09 DIAGNOSIS — I493 Ventricular premature depolarization: Secondary | ICD-10-CM | POA: Diagnosis not present

## 2021-09-09 DIAGNOSIS — I350 Nonrheumatic aortic (valve) stenosis: Secondary | ICD-10-CM | POA: Diagnosis not present

## 2021-09-09 DIAGNOSIS — E782 Mixed hyperlipidemia: Secondary | ICD-10-CM

## 2021-09-09 HISTORY — PX: TRANSTHORACIC ECHOCARDIOGRAM: SHX275

## 2021-09-09 LAB — ECHOCARDIOGRAM COMPLETE
AR max vel: 1.1 cm2
AV Area VTI: 1.09 cm2
AV Area mean vel: 1 cm2
AV Mean grad: 25 mmHg
AV Peak grad: 42.5 mmHg
Ao pk vel: 3.26 m/s
Area-P 1/2: 3.03 cm2
MV M vel: 6.24 m/s
MV Peak grad: 155.8 mmHg
P 1/2 time: 207 msec
S' Lateral: 2.5 cm

## 2021-09-10 NOTE — Progress Notes (Signed)
Location:  [place] of Service:  Clinic (12)  Provider:   Code Status:  Goals of Care:  Advanced Directives 03/09/2021  Does Patient Have a Medical Advance Directive? Yes  Type of Advance Directive Living will;Healthcare Power of Attorney  Does patient want to make changes to medical advance directive? No - Patient declined  Copy of [place] in Chart? Yes - validated most recent copy scanned in chart (See row information)  Pre-existing out of facility DNR order (yellow form or pink MOST form) -     Chief Complaint  Patient presents with   Medical Management of Chronic Issues    Patient returns to the clinic for 6 month follow up.     HPI: Patient is a 88 y.o. female seen today for medical management of chronic diseases.    Has h/o Severe AS with AR Has decided not to undergo TAVR. Continues to be stable Repeat Echo is pending Depression and anxiety Stable Lost her husband year ago Chronic back pain Has seen neurosurgery before Alternating diarrhea and constipation questionable IBS.  Has seen GI. COPD HLD, hypertension  Continues ot do well Walks with her walker Joslyn Hy lives here  Her son lives in [place]  Past Medical History:  Diagnosis Date   Arthritis    COPD (chronic obstructive pulmonary disease) ([place])    Depression    Diverticulosis    GERD (gastroesophageal reflux disease)    Hyperlipemia    Hypertension    Irregular heart beat 2014   PVCs   Moderate to severe aortic insufficiency 07/2019   Echo (03/08/2020): Aortic stenosis is now severe with mean gradient 60 mmHg. Aortic regurgitation is moderate to severe.   Osteoporosis    Severe aortic stenosis 06/11/2019   Echo 06/2019: EF 60-65%. Mild basal septal LVH, GR 2 DD. Mod LA dilation.- High LVEDP. Severe AI, Mod AS-peak gradient 43 mmHg, mean 28 mmHg;; 02/2020: Mean AoV Gradient 60 mmHg with Mod AI. EF 70-755.  Mod LVH. Mod LA dilation. Mod MR.    Past  Surgical History:  Procedure Laterality Date   ABDOMINAL HYSTERECTOMY     both appy and hyst done together   APPENDECTOMY     CATARACT EXTRACTION     TRANSTHORACIC ECHOCARDIOGRAM  06/2019   EF 60-65%. Mild basal septal LVH, GR 2 DD. Mod LA dilation.- High LVEDP. Severe AI, Mod AS-peak gradient 43 mmHg, mean 28 mmHg.Marland Kitchen   TRANSTHORACIC ECHOCARDIOGRAM  09/06/2020   EF 60-65%.  Normal LV fxn.  Mod Conc LVH of basal septal segment.  Indeterminate filling pattern (likely Gr2DD w/ Mod LA dilation).  Nl RV size and function.  Mildly increased PAP ~ 39.5 mmHg.  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  Mild-Mod TR.  Severe AoV Ca++ -> Mod-Severe AI & AS (mean gradient 33 mmHg) - ? lower b.c lower LVEF?    Allergies  Allergen Reactions   Fosamax [Alendronate Sodium]     Poor healing to mouth wound   Augmentin [Amoxicillin-Pot Clavulanate] Nausea Only    DID THE REACTION INVOLVE: Swelling of the face/tongue/throat, SOB, or low BP? No Sudden or severe rash/hives, skin peeling, or the inside of the mouth or nose? No Did it require medical treatment? No When did it last happen?       If all above answers are NO, may proceed with cephalosporin use.    Hydrocodone Nausea And Vomiting  Mobic [Meloxicam] Rash   Sulfonamide Derivatives Rash    Outpatient Encounter Medications as of 09/07/2021  Medication Sig   acetaminophen (TYLENOL) 650 MG CR tablet Take 650 mg by mouth in the morning, at noon, and at bedtime.    amLODipine (NORVASC) 2.5 MG tablet TAKE 1 TABLET BY MOUTH EVERY DAY   aspirin 81 MG tablet Take 81 mg by mouth daily.   lovastatin (MEVACOR) 40 MG tablet TAKE 1 TABLET BY MOUTH EVERY DAY   MELATONIN PO Take 2 mg by mouth at bedtime as needed.   pantoprazole (PROTONIX) 20 MG tablet Take 20 mg by mouth daily as needed.   Probiotic Product (PROBIOTIC DAILY PO) Take 1 capsule by mouth daily.   sertraline (ZOLOFT) 100 MG tablet TAKE 1 TABLET BY MOUTH EVERY DAY    SPIRIVA HANDIHALER 18 MCG inhalation capsule 1 CAPSULE BY INHALING THE CONTENTS OF THE CAPSULE USING THE HANDIHALER DEVICE ONCE A DAY INHALATION 90 DAYS   traMADol (ULTRAM) 50 MG tablet Take 1 tablet (50 mg total) by mouth every 6 (six) hours as needed for moderate pain.   valsartan-hydrochlorothiazide (DIOVAN-HCT) 320-12.5 MG tablet TAKE 1 TABLET BY MOUTH EVERY DAY   Vitamin D, Ergocalciferol, (DRISDOL) 1.25 MG (50000 UNIT) CAPS capsule TAKE 1 CAPSULE BY MOUTH ONE TIME PER WEEK   [DISCONTINUED] BREO ELLIPTA 100-25 MCG/INH AEPB TAKE 1 PUFF BY MOUTH EVERY DAY   No facility-administered encounter medications on file as of 09/07/2021.    Review of Systems:  Review of Systems  Constitutional:  Negative for activity change and appetite change.  HENT: Negative.    Respiratory:  Negative for cough and shortness of breath.   Cardiovascular:  Negative for leg swelling.  Gastrointestinal:  Negative for constipation.  Genitourinary: Negative.   Musculoskeletal:  Negative for arthralgias, gait problem and myalgias.  Skin: Negative.   Neurological:  Negative for dizziness and weakness.  Psychiatric/Behavioral:  Negative for confusion, dysphoric mood and sleep disturbance.    Health Maintenance  Topic Date Due   Zoster Vaccines- Shingrix (1 of 2) Never done   Pneumonia Vaccine 86+ Years old (2 - PCV) 11/08/2014   TETANUS/TDAP  11/06/2022   INFLUENZA VACCINE  Completed   DEXA SCAN  Completed   COVID-19 Vaccine  Completed   HPV VACCINES  Aged Out    Physical Exam: Vitals:   09/07/21 1515  BP: (!) 117/59  Pulse: 84  Temp: (!) 97.5 F (36.4 C)  SpO2: 95%  Weight: 134 lb (60.8 kg)  Height: $Remove'5\' 2"'[id number]$  (1.575 m)   Body mass index is 24.51 kg/m. Physical Exam Vitals reviewed.  Constitutional:      Appearance: Normal appearance.  HENT:     Head: Normocephalic.     Nose: Nose normal.     Mouth/Throat:     Mouth: Mucous membranes are moist.     Pharynx: Oropharynx is clear.  Eyes:      Pupils: Pupils are equal, round, and reactive to light.  Cardiovascular:     Rate and Rhythm: Normal rate and regular rhythm.     Pulses: Normal pulses.     Heart sounds: Murmur heard.  Pulmonary:     Effort: Pulmonary effort is normal.     Breath sounds: Normal breath sounds.  Abdominal:     General: Abdomen is flat. Bowel sounds are normal.     Palpations: Abdomen is soft.  Musculoskeletal:        General: No swelling.     Cervical back: Neck  supple.  Skin:    General: Skin is warm.  Neurological:     General: No focal deficit present.     Mental Status: She is alert and oriented to person, place, and time.  Psychiatric:        Mood and Affect: Mood normal.        Thought Content: Thought content normal.    Labs reviewed: Basic Metabolic Panel: Recent Labs    11/04/20 0820 09/01/21 0805  NA 135 132*  K 4.3 4.3  CL 98 95*  CO2 25 31  GLUCOSE 94 90  BUN 18 22  CREATININE 0.83 0.89  CALCIUM 9.0 9.4  TSH 1.46 1.23   Liver Function Tests: Recent Labs    11/04/20 0820 09/01/21 0805  AST 16 16  ALT 10 11  BILITOT 0.6 0.6  PROT 6.5 6.8   No results for input(s): LIPASE, AMYLASE in the last 8760 hours. No results for input(s): AMMONIA in the last 8760 hours. CBC: Recent Labs    11/04/20 0820 09/01/21 0805  WBC 5.9 5.8  NEUTROABS 3,623 3,758  HGB 12.4 12.0  HCT 36.8 35.6  MCV 91.8 89.0  PLT 221 238   Lipid Panel: Recent Labs    11/04/20 0820 09/01/21 0805  CHOL 170 173  HDL 77 75  LDLCALC 78 84  TRIG 69 61  CHOLHDL 2.2 2.3   No results found for: HGBA1C  Procedures since last visit: ECHOCARDIOGRAM COMPLETE  Result Date: 09/09/2021    ECHOCARDIOGRAM REPORT   Patient Name:   GRACI HULCE Date of Exam: 09/09/2021 Medical Rec #:  [id number]       Height:       62.0 in Accession #:    [id number]      Weight:       134.0 lb Date of Birth:  1931-12-06       BSA:          1.612 m Patient Age:    84 years        BP:           124/60 mmHg Patient Gender:  F               HR:           77 bpm. Exam Location:  [facility] Procedure: 2D Echo, Cardiac Doppler and Color Doppler Indications:    I35.0 Nonrheumatic aortic (valve) stenosis  History:        Patient has prior history of Echocardiogram examinations, most                 recent 09/06/2020. COPD, Arrythmias:PVC; Risk                 Factors:Hypertension and Dyslipidemia. Chronic back pain.                 Bilateral leg edema.  Sonographer:    Diamond Nickel RCS Referring Phys: [place]  1. Left ventricular ejection fraction, by estimation, is 60 to 65%. The left ventricle has normal function. The left ventricle has no regional wall motion abnormalities. There is mild left ventricular hypertrophy of the basal-septal segment. Left ventricular diastolic parameters are indeterminate. Elevated left atrial pressure.  2. Right ventricular systolic function is normal. The right ventricular size is normal. There is normal pulmonary artery systolic pressure.  3. Left atrial size was moderately dilated.  4. The mitral valve is degenerative. Moderate mitral valve regurgitation. Moderate mitral stenosis.  The mean mitral valve gradient is 9.1 mmHg with average heart rate of 77 bpm. Severe mitral annular calcification.  5. Tricuspid valve regurgitation is moderate.  6. The aortic valve is tricuspid. There is moderate calcification of the aortic valve. There is severe thickening of the aortic valve. Aortic valve regurgitation is moderate to severe. Moderate aortic valve stenosis.  7. The inferior vena cava is normal in size with greater than 50% respiratory variability, suggesting right atrial pressure of 3 mmHg. Comparison(s): No significant change from prior study. Prior images reviewed side by side. FINDINGS  Left Ventricle: Left ventricular ejection fraction, by estimation, is 60 to 65%. The left ventricle has normal function. The left ventricle has no regional wall motion abnormalities. The left  ventricular internal cavity size was normal in size. There is  mild left ventricular hypertrophy of the basal-septal segment. Left ventricular diastolic function could not be evaluated due to mitral stenosis. Left ventricular diastolic parameters are indeterminate. Elevated left atrial pressure. Right Ventricle: The right ventricular size is normal. No increase in right ventricular wall thickness. Right ventricular systolic function is normal. There is normal pulmonary artery systolic pressure. The tricuspid regurgitant velocity is 2.86 m/s, and  with an assumed right atrial pressure of 3 mmHg, the estimated right ventricular systolic pressure is 94.1 mmHg. Left Atrium: Left atrial size was moderately dilated. Right Atrium: Right atrial size was normal in size. Pericardium: There is no evidence of pericardial effusion. Mitral Valve: The mitral valve is degenerative in appearance. There is mild thickening of the mitral valve leaflet(s). There is mild calcification of the mitral valve leaflet(s). Severe mitral annular calcification. Moderate mitral valve regurgitation, with centrally-directed jet. Moderate mitral valve stenosis. The mean mitral valve gradient is 9.1 mmHg with average heart rate of 77 bpm. Tricuspid Valve: The tricuspid valve is normal in structure. Tricuspid valve regurgitation is moderate. Aortic Valve: The aortic valve is tricuspid. There is moderate calcification of the aortic valve. There is severe thickening of the aortic valve. Aortic valve regurgitation is moderate to severe. Aortic regurgitation PHT measures 207 msec. Moderate aortic stenosis is present. Aortic valve mean gradient measures 25.0 mmHg. Aortic valve peak gradient measures 42.5 mmHg. Aortic valve area, by VTI measures 1.09 cm. Pulmonic Valve: The pulmonic valve was normal in structure. Pulmonic valve regurgitation is trivial. Aorta: The aortic root and ascending aorta are structurally normal, with no evidence of dilitation.  Venous: The inferior vena cava is normal in size with greater than 50% respiratory variability, suggesting right atrial pressure of 3 mmHg. IAS/Shunts: No atrial level shunt detected by color flow Doppler.  LEFT VENTRICLE PLAX 2D LVIDd:         3.60 cm   Diastology LVIDs:         2.50 cm   LV e' medial:    7.35 cm/s LV PW:         1.00 cm   LV E/e' medial:  21.5 LV IVS:        1.50 cm   LV e' lateral:   9.75 cm/s LVOT diam:     2.22 cm   LV E/e' lateral: 16.2 LV SV:         89 LV SV Index:   55 LVOT Area:     3.87 cm  RIGHT VENTRICLE RV Basal diam:  2.60 cm RV S prime:     10.50 cm/s TAPSE (M-mode): 2.6 cm RVSP:           35.7 mmHg LEFT ATRIUM  Index        RIGHT ATRIUM           Index LA diam:        4.60 cm 2.85 cm/m   RA Pressure: 3.00 mmHg LA Vol (A2C):   73.5 ml 45.58 ml/m  RA Area:     13.90 cm LA Vol (A4C):   58.2 ml 36.09 ml/m  RA Volume:   33.60 ml  20.84 ml/m LA Biplane Vol: 66.5 ml 41.24 ml/m  AORTIC VALVE AV Area (Vmax):    1.10 cm AV Area (Vmean):   1.00 cm AV Area (VTI):     1.09 cm AV Vmax:           326.00 cm/s AV Vmean:          233.500 cm/s AV VTI:            0.814 m AV Peak Grad:      42.5 mmHg AV Mean Grad:      25.0 mmHg LVOT Vmax:         92.90 cm/s LVOT Vmean:        60.600 cm/s LVOT VTI:          0.229 m LVOT/AV VTI ratio: 0.28 AI PHT:            207 msec  AORTA Ao Root diam: 2.80 cm Ao Asc diam:  3.10 cm MITRAL VALVE                TRICUSPID VALVE MV Area (PHT): 3.03 cm     TR Peak grad:   32.7 mmHg MV Mean grad:  9.1 mmHg     TR Vmax:        286.00 cm/s MV Decel Time: 250 msec     Estimated RAP:  3.00 mmHg MR Peak grad: 155.8 mmHg    RVSP:           35.7 mmHg MR Mean grad: 97.0 mmHg MR Vmax:      624.00 cm/s   SHUNTS MR Vmean:     462.0 cm/s    Systemic VTI:  0.23 m MV E velocity: 158.00 cm/s  Systemic Diam: 2.22 cm MV A velocity: 150.00 cm/s MV E/A ratio:  1.05 Mihai Croitoru MD Electronically signed by Sanda Klein MD Signature Date/Time: 09/09/2021/4:16:38 PM     Final     Assessment/Plan 1. Chronic bilateral low back pain without sciatica Tylenol PRN Using Gilford Rile now and it is helping some Says she was seen By Dr Rita Ohara before. No Surgery candidate 2. Severe aortic insufficiency and AS Repeat Echo pending No Symptoms  3. Essential hypertension Stable on Diovan and Norvasc  4. Depression, recurrent ([place]) On Zoloft Discussed to reduce the dose if she wants  5. Bilateral leg edema Off lasix for now  6. Simple chronic bronchitis (HCC) Stable on Spiriva  7. Osteoporosis, unspecified osteoporosis type, unspecified pathological fracture presence Received Reclast for 8 years Does not want Prolia right now Discussed her DEXA today  8. Hyponatremia Repeat in 6 weeks Worried about slight variation - BMP with eGFR([facility]); Future  9. Other hyperlipidemia On statin    Labs/tests ordered:  * No order type specified * Next appt:  10/20/2021

## 2021-09-18 ENCOUNTER — Other Ambulatory Visit: Payer: Self-pay | Admitting: Internal Medicine

## 2021-10-06 DIAGNOSIS — L814 Other melanin hyperpigmentation: Secondary | ICD-10-CM | POA: Diagnosis not present

## 2021-10-06 DIAGNOSIS — L821 Other seborrheic keratosis: Secondary | ICD-10-CM | POA: Diagnosis not present

## 2021-10-06 DIAGNOSIS — D1801 Hemangioma of skin and subcutaneous tissue: Secondary | ICD-10-CM | POA: Diagnosis not present

## 2021-10-06 DIAGNOSIS — Z85828 Personal history of other malignant neoplasm of skin: Secondary | ICD-10-CM | POA: Diagnosis not present

## 2021-10-14 ENCOUNTER — Other Ambulatory Visit: Payer: Self-pay | Admitting: Internal Medicine

## 2021-10-20 ENCOUNTER — Other Ambulatory Visit: Payer: Self-pay

## 2021-10-20 DIAGNOSIS — E871 Hypo-osmolality and hyponatremia: Secondary | ICD-10-CM

## 2021-10-20 LAB — BASIC METABOLIC PANEL WITH GFR
BUN/Creatinine Ratio: 36 (calc) — ABNORMAL HIGH (ref 6–22)
BUN: 30 mg/dL — ABNORMAL HIGH (ref 7–25)
CO2: 28 mmol/L (ref 20–32)
Calcium: 9.2 mg/dL (ref 8.6–10.4)
Chloride: 97 mmol/L — ABNORMAL LOW (ref 98–110)
Creat: 0.84 mg/dL (ref 0.60–0.95)
Glucose, Bld: 80 mg/dL (ref 65–99)
Potassium: 3.8 mmol/L (ref 3.5–5.3)
Sodium: 133 mmol/L — ABNORMAL LOW (ref 135–146)
eGFR: 67 mL/min/{1.73_m2} (ref >=60)

## 2021-10-28 DIAGNOSIS — M26622 Arthralgia of left temporomandibular joint: Secondary | ICD-10-CM | POA: Diagnosis not present

## 2021-10-28 DIAGNOSIS — H6122 Impacted cerumen, left ear: Secondary | ICD-10-CM | POA: Diagnosis not present

## 2021-10-28 DIAGNOSIS — H938X1 Other specified disorders of right ear: Secondary | ICD-10-CM | POA: Diagnosis not present

## 2021-11-14 ENCOUNTER — Other Ambulatory Visit: Payer: Self-pay | Admitting: Internal Medicine

## 2022-01-02 ENCOUNTER — Encounter: Payer: Self-pay | Admitting: Orthopedic Surgery

## 2022-01-02 ENCOUNTER — Non-Acute Institutional Stay: Payer: Medicare PPO | Admitting: Orthopedic Surgery

## 2022-01-02 VITALS — HR 82 | Temp 97.8°F | Ht 62.0 in | Wt 130.9 lb

## 2022-01-02 DIAGNOSIS — Z0000 Encounter for general adult medical examination without abnormal findings: Secondary | ICD-10-CM | POA: Diagnosis not present

## 2022-01-02 NOTE — Progress Notes (Signed)
? ?Subjective:  ? Robin [name] is a 88 y.o. female who presents for Medicare Annual (Subsequent) preventive examination. ? ?Place of Service: [facility] ?Provider: Hazle Nordmann, AGNP-C  ? ?Review of Systems    ? ?  ? ?   ?Objective:  ?  ?Today's Vitals  ? 01/02/22 1421  ?Pulse: 82  ?Temp: 97.8 ?F (36.6 ?C)  ?TempSrc: Temporal  ?SpO2: 90%  ?Weight: 130 lb 14.4 oz (59.4 kg)  ?Height: 5\' 2"  (1.575 m)  ? ?Body mass index is 23.94 kg/m?. ? ? ?  01/02/2022  ? 10:59 AM 03/09/2021  ?  3:51 PM 09/23/2020  ?  3:54 PM 09/04/2018  ? 10:00 PM 09/04/2018  ?  7:47 PM 09/04/2018  ? 12:54 PM 08/29/2018  ?  8:25 PM  ?Advanced Directives  ?Does Patient Have a Medical Advance Directive? Yes Yes Yes Yes Yes No Yes  ?Type of Advance Directive Out of facility DNR (pink MOST or yellow form) Living will;Healthcare Power of Attorney Out of facility DNR (pink MOST or yellow form) Healthcare Power of [place];Living will Healthcare Power of [place];Living will  Living will;Healthcare Power of Attorney  ?Does patient want to make changes to medical advance directive? No - Patient declined No - Patient declined No - Patient declined No - Patient declined No - Patient declined  No - Patient declined  ?Copy of Healthcare Power of Attorney in Chart?  Yes - validated most recent copy scanned in chart (See row information)  No - copy requested No - copy requested  No - copy requested  ?Pre-existing out of facility DNR order (yellow form or pink MOST form) Pink MOST/Yellow Form most recent copy in chart - Physician notified to receive inpatient order  Pink MOST form placed in chart (order not valid for inpatient use)      ? ? ?Current Medications (verified) ?Outpatient Encounter Medications as of 01/02/2022  ?Medication Sig  ? acetaminophen (TYLENOL) 650 MG CR tablet Take 650 mg by mouth in the morning, at noon, and at bedtime.   ? amLODipine (NORVASC) 2.5 MG tablet TAKE 1 TABLET BY MOUTH EVERY DAY  ? aspirin 81 MG tablet Take 81 mg by mouth daily.   ? lovastatin (MEVACOR) 40 MG tablet TAKE 1 TABLET BY MOUTH EVERY DAY  ? MELATONIN PO Take 2 mg by mouth at bedtime as needed.  ? pantoprazole (PROTONIX) 20 MG tablet Take 20 mg by mouth daily as needed.  ? Probiotic Product (PROBIOTIC DAILY PO) Take 1 capsule by mouth daily.  ? sertraline (ZOLOFT) 100 MG tablet TAKE 1 TABLET BY MOUTH EVERY DAY  ? SPIRIVA HANDIHALER 18 MCG inhalation capsule INHALE 1 CAPSULE USING THE HANDIHALER DEVICE ONCE A DAY  ? traMADol (ULTRAM) 50 MG tablet Take 1 tablet (50 mg total) by mouth every 6 (six) hours as needed for moderate pain.  ? valsartan-hydrochlorothiazide (DIOVAN-HCT) 320-12.5 MG tablet TAKE 1 TABLET BY MOUTH EVERY DAY  ? Vitamin D, Ergocalciferol, (DRISDOL) 1.25 MG (50000 UNIT) CAPS capsule TAKE 1 CAPSULE BY MOUTH ONE TIME PER WEEK  ? ?No facility-administered encounter medications on file as of 01/02/2022.  ? ? ?Allergies (verified) ?Fosamax [alendronate sodium], Augmentin [amoxicillin-pot clavulanate], Hydrocodone, Mobic [meloxicam], and Sulfonamide derivatives  ? ?History: ?Past Medical History:  ?Diagnosis Date  ? Arthritis   ? COPD (chronic obstructive pulmonary disease) (HCC)   ? Depression   ? Diverticulosis   ? GERD (gastroesophageal reflux disease)   ? Hyperlipemia   ? Hypertension   ?  Irregular heart beat 2014  ? PVCs  ? Moderate to severe aortic insufficiency 07/2019  ? Echo (03/08/2020): Aortic stenosis is now severe with mean gradient 60 mmHg. Aortic regurgitation is moderate to severe.  ? Osteoporosis   ? Severe aortic stenosis 06/11/2019  ? Echo 06/2019: EF 60-65%. Mild basal septal LVH, GR 2 DD. Mod LA dilation.- High LVEDP. Severe AI, Mod AS-peak gradient 43 mmHg, mean 28 mmHg;; 02/2020: Mean AoV Gradient 60 mmHg with Mod AI. EF 70-755.  Mod LVH. Mod LA dilation. Mod MR.  ? ?Past Surgical History:  ?Procedure Laterality Date  ? ABDOMINAL HYSTERECTOMY    ? both appy and hyst done together  ? APPENDECTOMY    ? CATARACT EXTRACTION    ? TRANSTHORACIC ECHOCARDIOGRAM   06/2019  ? EF 60-65%. Mild basal septal LVH, GR 2 DD. Mod LA dilation.- High LVEDP. Severe AI, Mod AS-peak gradient 43 mmHg, mean 28 mmHg..  ? TRANSTHORACIC ECHOCARDIOGRAM  09/06/2020  ? EF 60-65%.  Normal LV fxn.  Mod Conc LVH of basal septal segment.  Indeterminate filling pattern (likely Gr2DD w/ Mod LA dilation).  Nl RV size and function.  Mildly increased PAP ~ 39.5 mmHg.  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  Mild-Mod TR.  Severe AoV Ca++ -> Mod-Severe AI & AS (mean gradient 33 mmHg) - ? lower b.c lower LVEF?  ? ?Family History  ?Problem Relation Age of Onset  ? Lung cancer Father   ? Cancer Sister   ?     biliary  ? Hypertension Mother   ? Cancer Sister   ? ?Social History  ? ?Socioeconomic History  ? Marital status: Married  ?  Spouse name: Not on file  ? Number of children: 2  ? Years of education: Not on file  ? Highest education level: Not on file  ?Occupational History  ? Occupation: retired-schoolteacher  ?  Comment: Teacher  ?Tobacco Use  ? Smoking status: Former  ?  Packs/day: 1.00  ?  Years: 30.00  ?  Pack years: 30.00  ?  Types: Cigarettes  ?  Quit date: 08/28/2008  ?  Years since quitting: 13.3  ? Smokeless tobacco: Never  ?Vaping Use  ? Vaping Use: Never used  ?Substance and Sexual Activity  ? Alcohol use: Yes  ?  Alcohol/week: 14.0 standard drinks  ?  Types: 14 Standard drinks or equivalent per week  ?  Comment: Every now and then  ? Drug use: No  ? Sexual activity: Not on file  ?Other Topics Concern  ? Not on file  ?Social History Narrative  ? Currently remarried.  New husband has significant cardiac history.  (Followed by Dr. Herbie Baltimore)  ? She is a retired Runner, broadcasting/film/video.  ?   ? She and her new husband [name]") moved to [facility] just prior to the outbreak of the pandemic in the Spring of 2020.   ? [name] - Ethelreda Sukhu) died 2020/01/17 -> longstanding Parkinson's disease complicated by dementia.  ?   ? --> Now finally able to work with PT/OT to rehab - chronic back  pain with very unsteady gait/poor balance. -- Uses cane for short distance - but mostly uses Walker.  Really only walks to & from the cafeteria if not working with therapy.She does have a pretty unsteady gait and has to either use a cane for short distances or uses a walker for longer distance. -->This is really because of her bad  back and poor balance.  As result of this, she really is not all that active, and is very upset about not being on to do her rehab.  ? ?Social Determinants of Health  ? ?Financial Resource Strain: Not on file  ?Food Insecurity: Not on file  ?Transportation Needs: Not on file  ?Physical Activity: Not on file  ?Stress: Not on file  ?Social Connections: Not on file  ? ? ?Tobacco Counseling ?Counseling given: Not Answered ? ? ?Clinical Intake: ? ?  ? ?  ? ?  ? ?  ? ?  ? ?Diabetic? ? ?  ? ?  ? ? ?Activities of Daily Living ?   ? View : No data to display.  ?  ?  ?  ? ? ?Patient Care Team: ?Mahlon GammonGupta, Anjali L, MD as PCP - General (Internal Medicine) ?Marykay LexHarding, David W, MD as PCP - Cardiology (Cardiology) ? ?Indicate any recent Medical Services you may have received from other than [facility] providers in the past year (date may be approximate). ? ?   ?Assessment:  ? This is a routine wellness examination for [facility]. ? ?Hearing/Vision screen ?Hearing Screening - Comments:: Patient has gotten ears checked recently.  ?Vision Screening - Comments:: Patient has not gotten eyes checked in the last 12 months. ? ?Dietary issues and exercise activities discussed: ?  ? ? Goals Addressed   ?None ?  ? ?Depression Screen ? ?  01/02/2022  ?  2:28 PM 09/23/2020  ?  3:51 PM  ?PHQ 2/9 Scores  ?PHQ - 2 Score 0 0  ?  ?Fall Risk ? ?  01/02/2022  ?  2:27 PM 09/07/2021  ?  3:17 PM 05/25/2021  ? 11:06 AM 03/09/2021  ?  3:51 PM 11/10/2020  ?  1:36 PM  ?Fall Risk   ?Falls in the past year? 0 0 0 0 0  ?Number falls in past yr: 0 0 0 0 0  ?Injury with Fall? 0 0     ?Risk for fall due to : No Fall Risks No Fall Risks     ?Follow up Falls  evaluation completed Falls evaluation completed Falls evaluation completed Falls evaluation completed   ? ? ?FALL RISK PREVENTION PERTAINING TO THE HOME: ? ?Any stairs in or around the home? No  ?If so,

## 2022-01-02 NOTE — Patient Instructions (Signed)
?  Ms. Doose , ?Thank you for taking time to come for your Medicare Wellness Visit. I appreciate your ongoing commitment to your health goals. Please review the following plan we discussed and let me know if I can assist you in the future.  ? ?These are the goals we discussed: ? Goals   ? ?  Maintain Mobility and Function   ?  Evidence-based guidance:  ?Emphasize the importance of physical activity and aerobic exercise as included in treatment plan; assess barriers to adherence; consider patient's abilities and preferences.  ?Encourage gradual increase in activity or exercise instead of stopping if pain occurs.  ?Reinforce individual therapy exercise prescription, such as strengthening, stabilization and stretching programs.  ?Promote optimal body mechanics to stabilize the spine with lifting and functional activity.  ?Encourage activity and mobility modifications to facilitate optimal function, such as using a log roll for bed mobility or dressing from a seated position.  ?Reinforce individual adaptive equipment recommendations to limit excessive spinal movements, such as a Systems analyst.  ?Assess adequacy of sleep; encourage use of sleep hygiene techniques, such as bedtime routine; use of white noise; dark, cool bedroom; avoiding daytime naps, heavy meals or exercise before bedtime.  ?Promote positions and modification to optimize sleep and sexual activity; consider pillows or positioning devices to assist in maintaining neutral spine.  ?Explore options for applying ergonomic principles at work and home, such as frequent position changes, using ergonomically designed equipment and working at optimal height.  ?Promote modifications to increase comfort with driving such as lumbar support, optimizing seat and steering wheel position, using cruise control and taking frequent rest stops to stretch and walk.   ?Notes:  ?  ?  Patient Stated   ?  In 09/23/20 goal is that she would like to have more energy  ?  ? ?  ?   ?This is a list of the screening recommended for you and due dates:  ?Health Maintenance  ?Topic Date Due  ? Zoster (Shingles) Vaccine (1 of 2) Never done  ? Pneumonia Vaccine (2 - PCV) 11/08/2014  ? Flu Shot  03/28/2022  ? Tetanus Vaccine  11/06/2022  ? DEXA scan (bone density measurement)  Completed  ? COVID-19 Vaccine  Completed  ? HPV Vaccine  Aged Out  ? ?Please get Prevnar 13 vaccine and Tdap (tetanus) vaccine at local pharmacy.  ?

## 2022-02-17 ENCOUNTER — Ambulatory Visit: Payer: Medicare PPO | Admitting: Cardiology

## 2022-02-17 ENCOUNTER — Encounter: Payer: Self-pay | Admitting: Cardiology

## 2022-02-17 VITALS — BP 126/54 | HR 89 | Ht 62.0 in | Wt 131.8 lb

## 2022-02-17 DIAGNOSIS — I351 Nonrheumatic aortic (valve) insufficiency: Secondary | ICD-10-CM

## 2022-02-17 DIAGNOSIS — I10 Essential (primary) hypertension: Secondary | ICD-10-CM | POA: Diagnosis not present

## 2022-02-17 DIAGNOSIS — E785 Hyperlipidemia, unspecified: Secondary | ICD-10-CM | POA: Diagnosis not present

## 2022-02-17 DIAGNOSIS — I493 Ventricular premature depolarization: Secondary | ICD-10-CM | POA: Diagnosis not present

## 2022-02-17 DIAGNOSIS — I350 Nonrheumatic aortic (valve) stenosis: Secondary | ICD-10-CM

## 2022-02-17 NOTE — Progress Notes (Signed)
Primary Care Provider: Mahlon Gammon, MD Cardiologist: Bryan Lemma, MD Electrophysiologist: None  Clinic Note: Chief Complaint  Patient presents with   Follow-up    6 months-no new symptoms.   Aortic Stenosis    Essentially severe AS with AI, she is not interested in further testing or procedures at this point.   ===================================  ASSESSMENT/PLAN   Problem List Items Addressed This Visit       Cardiology Problems   Severe aortic insufficiency (Chronic)    Moderate to severe AI.  No plans for further dilation with the designs on TAVR. Simply treat BP and symptoms.  So far no heart failure symptoms.      Relevant Orders   EKG 12-Lead (Completed)   Severe aortic stenosis by prior echocardiogram - Primary (Chronic)    Severe-moderate to severe AAS with a drop in EF. She remains asymptomatic and limited more by arthritis pains in her joints and back.  Previous visits would indicate that she has made the decision that she does not want any invasive procedures and therefore we decided to stop checking surveillance echocardiograms.  Plan will be to monitor for symptoms and reduce risk factors.  Continue BP control but not aggressive, continue statin and aspirin.      Dyslipidemia, goal LDL below 100 (Chronic)    She is on Mevacor with relatively well-controlled lipids as of January.  LDL was 84.  PCP following up with upcoming labs soon.  I would not be more aggressive.      Essential hypertension (Chronic)    Well-controlled BP on low-dose amlodipine high-dose valsartan and HCTZ.      Relevant Orders   EKG 12-Lead (Completed)   PVC's (premature ventricular contractions) (Chronic)    Asymptomatic.  Did not do well with beta-blocker.      Relevant Orders   EKG 12-Lead (Completed)   ===================================  HPI:    Robin [name] is a 88 y.o. female with significant valvular heart disease reviewed below who presents today for  73-month follow-up.  PMH  Significant AORTIC STENOSIS/INSUFFICIENCY (moderate to severe AI/severe AS, along with moderate MR). HTN, HLD, palpitations,  COPD => only using Spiriva.  Stopped taking Breo. GERD.  I most recently saw Robin [name] back in March 2022 following her TAVR consult with Dr. Clifton James in January 2022.  At that time she was still debating whether or not she wanted to TAVR but has eventually decided that she does not want to proceed.  Accompanied by her caregiver from the facility facility.  She does not really do much in the way of any activity.  Does not walk much and does not exercise.  This is related to her back and leg and thigh pain.  No active chest pain pressure at rest or exertion.  No real exertional dyspnea and just deconditioning.  No CHF symptoms of PND, orthopnea with trivial edema and trivial exertional dyspnea.  No irregular heartbeats palpitations. => No changes  Robin [name] was last seen on August 09, 2021 by Jari Favre, PA--doing well regarding standpoint.  Occasional dyspnea with activity but probably associated with COPD.  She stopped taking Breo and was still taking Spiriva.  No syncope/near syncope.  Activity limited by back and leg pain.  BP well controlled at home on low-dose Norvasc along with Diovan.  Reiterated that she was certain of her decision not to proceed with TAVR.  No resting or exertional dyspnea.  No chest pain or pressure.  No palpitations.  No PND orthopnea or edema.  Recent Hospitalizations: None  Reviewed  CV studies:    The following studies were reviewed today: (if available, images/films reviewed: From Epic Chart or Care Everywhere) None: Per choice, she has chosen NOT to have further testing  Interval History:   Robin [name] returns here today for 29-month follow-up pretty much stable from a cardiac standpoint.  She says that she is really limited by back pain as far as how much activity she is able to do.  She says that she  sleeps probably with a open mouth because she wakes up with a dry mouth.  Is also having a lot of nighttime coughing because of postnasal drip. She is able to walk around the nursing facility but she uses a cane if she is getting around the apartment or when she is at the dining facility, but otherwise if she is going further, she is a walker.  Sometimes if she is going longer distances she will actually use a wheelchair.  She is not really noticing any symptoms of chest pain or dyspnea with low levels of exertion, but walking a little further she will get short of breath, as much because her back hurts her is because she actually is getting short of breath.  No PND orthopnea.  Trivial edema.  Otherwise normal CV Review of Symptoms  Cardiovascular ROS (Summary): positive for - dyspnea on exertion and this is much related to back pain as it is true dyspnea; lightheadedness and dizziness with rapid movements or standing. negative for - chest pain, edema, irregular heartbeat, orthopnea, palpitations, paroxysmal nocturnal dyspnea, rapid heart rate, shortness of breath, or syncope/near syncope, TIA/amaurosis fugax or claudication.  REVIEWED OF SYSTEMS   Review of Systems  Constitutional:  Positive for malaise/fatigue (TAVR for her to do activities.  Limited by back pain.). Negative for weight loss.  HENT:  Positive for congestion (Postnasal drip). Negative for nosebleeds.   Respiratory:  Positive for cough (Morning cough) and shortness of breath (Only if she overdoes it).   Cardiovascular:        Per HPI  Gastrointestinal:  Negative for blood in stool and melena.  Genitourinary:  Negative for hematuria.  Musculoskeletal:  Positive for back pain and joint pain.  Neurological:  Positive for dizziness (Standing up fast, if she turns too fast) and focal weakness (Generalized leg weakness). Negative for tingling.  Endo/Heme/Allergies:  Positive for environmental allergies.  Psychiatric/Behavioral:   Positive for memory loss.    I have reviewed and (if needed) personally updated the patient's problem list, medications, allergies, past medical and surgical history, social and family history.   PAST MEDICAL HISTORY   Past Medical History:  Diagnosis Date   Arthritis    COPD (chronic obstructive pulmonary disease) (HCC)    Depression    Diverticulosis    GERD (gastroesophageal reflux disease)    Hyperlipemia    Hypertension    Irregular heart beat 2014   PVCs   Moderate to severe aortic insufficiency 07/2019   Echo (03/08/2020): Aortic stenosis is now severe with mean gradient 60 mmHg. Aortic regurgitation is moderate to severe.   Osteoporosis    Severe aortic stenosis 06/11/2019   Echo 06/2019: EF 60-65%. Mild basal septal LVH, GR 2 DD. Mod LA dilation.- High LVEDP. Severe AI, Mod AS-peak gradient 43 mmHg, mean 28 mmHg;; 02/2020: Mean AoV Gradient 60 mmHg with Mod AI. EF 70-755.  Mod LVH. Mod LA dilation. Mod MR.    PAST SURGICAL HISTORY  Past Surgical History:  Procedure Laterality Date   ABDOMINAL HYSTERECTOMY     both appy and hyst done together   APPENDECTOMY     CATARACT EXTRACTION     TRANSTHORACIC ECHOCARDIOGRAM  06/2019   EF 60-65%. Mild basal septal LVH, GR 2 DD. Mod LA dilation.- High LVEDP. Severe AI, Mod AS-peak gradient 43 mmHg, mean 28 mmHg.Marland Kitchen   TRANSTHORACIC ECHOCARDIOGRAM  09/06/2020   EF 60-65%.  Normal LV fxn.  Mod Conc LVH of basal septal segment.  Indeterminate filling pattern (likely Gr2DD w/ Mod LA dilation).  Nl RV size and function.  Mildly increased PAP ~ 39.5 mmHg.  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  Mild-Mod TR.  Severe AoV Ca++ -> Mod-Severe AI & AS (mean gradient 33 mmHg) - ? lower b.c lower LVEF?    Immunization History  Administered Date(s) Administered   Influenza, High Dose Seasonal PF 06/11/2019   Influenza-Unspecified 05/18/2020, 06/24/2021   Moderna SARS-COV2 Booster Vaccination 02/02/2021   Moderna  Sars-Covid-2 Vaccination 09/01/2019, 09/29/2019, 07/12/2020   Pfizer Covid-19 Vaccine Bivalent Booster 23yrs & up 06/24/2021   Pneumococcal Polysaccharide-23 11/07/2013   Tdap 11/05/2012    MEDICATIONS/ALLERGIES   Current Meds  Medication Sig   acetaminophen (TYLENOL) 650 MG CR tablet Take 650 mg by mouth in the morning, at noon, and at bedtime.    amLODipine (NORVASC) 2.5 MG tablet TAKE 1 TABLET BY MOUTH EVERY DAY   aspirin 81 MG tablet Take 81 mg by mouth daily.   lovastatin (MEVACOR) 40 MG tablet TAKE 1 TABLET BY MOUTH EVERY DAY   MELATONIN PO Take 2 mg by mouth at bedtime as needed.   pantoprazole (PROTONIX) 20 MG tablet Take 20 mg by mouth daily as needed.   Probiotic Product (PROBIOTIC DAILY PO) Take 1 capsule by mouth daily.   sertraline (ZOLOFT) 100 MG tablet TAKE 1 TABLET BY MOUTH EVERY DAY   SPIRIVA HANDIHALER 18 MCG inhalation capsule INHALE 1 CAPSULE USING THE HANDIHALER DEVICE ONCE A DAY   traMADol (ULTRAM) 50 MG tablet Take 1 tablet (50 mg total) by mouth every 6 (six) hours as needed for moderate pain.   valsartan-hydrochlorothiazide (DIOVAN-HCT) 320-12.5 MG tablet TAKE 1 TABLET BY MOUTH EVERY DAY   Vitamin D, Ergocalciferol, (DRISDOL) 1.25 MG (50000 UNIT) CAPS capsule TAKE 1 CAPSULE BY MOUTH ONE TIME PER WEEK    Allergies  Allergen Reactions   Fosamax [Alendronate Sodium]     Poor healing to mouth wound   Augmentin [Amoxicillin-Pot Clavulanate] Nausea Only    DID THE REACTION INVOLVE: Swelling of the face/tongue/throat, SOB, or low BP? No Sudden or severe rash/hives, skin peeling, or the inside of the mouth or nose? No Did it require medical treatment? No When did it last happen?       If all above answers are "NO", may proceed with cephalosporin use.    Hydrocodone Nausea And Vomiting   Mobic [Meloxicam] Rash   Sulfonamide Derivatives Rash    SOCIAL HISTORY/FAMILY HISTORY   Reviewed in Epic:  Pertinent findings:  Social History   Tobacco Use   Smoking  status: Former    Packs/day: 1.00    Years: 30.00    Total pack years: 30.00    Types: Cigarettes    Quit date: 08/28/2008    Years since quitting: 13.5   Smokeless tobacco: Never  Vaping Use   Vaping Use: Never used  Substance Use Topics   Alcohol use: Yes  Alcohol/week: 14.0 standard drinks of alcohol    Types: 14 Standard drinks or equivalent per week    Comment: Every now and then   Drug use: No   Social History   Social History Narrative   Currently remarried.  New husband has significant cardiac history.  (Followed by Dr. Ellyn Hack)   She is a retired Pharmacist, hospital.      She and her new husband Obie Dredge") moved to [facility] just prior to the outbreak of the pandemic in the Spring of 2020.    Obie Dredge - Measia Galicia) died 01-18-2020 -> longstanding Parkinson's disease complicated by dementia.      --> Now finally able to work with PT/OT to rehab - chronic back pain with very unsteady gait/poor balance. -- Uses cane for short distance - but mostly uses Walker.  Really only walks to & from the cafeteria if not working with therapy.She does have a pretty unsteady gait and has to either use a cane for short distances or uses a walker for longer distance. -->This is really because of her bad back and poor balance.  As result of this, she really is not all that active, and is very upset about not being on to do her rehab.    OBJCTIVE -PE, EKG, labs   Wt Readings from Last 3 Encounters:  03/08/22 131 lb 8 oz (59.6 kg)  02/17/22 131 lb 12.8 oz (59.8 kg)  01/02/22 130 lb 14.4 oz (59.4 kg)    Physical Exam: BP (!) 126/54   Pulse 89   Ht 5\' 2"  (1.575 m)   Wt 131 lb 12.8 oz (59.8 kg)   SpO2 97%   BMI 24.11 kg/m  Physical Exam Vitals reviewed.  Constitutional:      General: She is not in acute distress.    Appearance: Normal appearance. She is normal weight. She is not ill-appearing or toxic-appearing.     Comments: Pleasant, somewhat frail elderly woman.  Well-groomed.  HENT:      Head: Normocephalic and atraumatic.  Neck:     Vascular: Decreased carotid pulses. Carotid bruit (Radiated AOV murmur) present. No JVD.  Cardiovascular:     Rate and Rhythm: Normal rate and regular rhythm. Occasional Extrasystoles are present.    Chest Wall: PMI is not displaced.     Pulses: Normal pulses.     Heart sounds: S1 normal and S2 normal. Murmur heard.     High-pitched harsh crescendo-decrescendo mid to late systolic murmur is present with a grade of 4/6 at the upper right sternal border radiating to the neck.     High-pitched blowing decrescendo holosystolic murmur is also present at the upper left sternal border and apex radiating to the apex.     Medium-pitched blowing early diastolic murmur is present with a grade of 2/4 at the upper right sternal border and upper left sternal border.     No friction rub. No gallop.  Pulmonary:     Effort: Pulmonary effort is normal. No respiratory distress.     Breath sounds: Normal breath sounds. No wheezing, rhonchi or rales.  Musculoskeletal:        General: Normal range of motion.     Cervical back: Normal range of motion and neck supple.  Skin:    General: Skin is warm and dry.  Neurological:     General: No focal deficit present.     Mental Status: She is alert and oriented to person, place, and time.     Gait:  Gait abnormal (Walking with a cane, somewhat unsteady).  Psychiatric:        Mood and Affect: Mood normal.        Thought Content: Thought content normal.        Judgment: Judgment normal.     Adult ECG Report  Rate: 89;  Rhythm: normal sinus rhythm and 1  AVB ; anterior MI, age-indeterminate.  Otherwise normal axis, normal durations.  Narrative Interpretation: Stable  Recent Labs: Reviewed Lab Results  Component Value Date   CHOL 173 09/01/2021   HDL 75 09/01/2021   LDLCALC 84 09/01/2021   TRIG 61 09/01/2021   CHOLHDL 2.3 09/01/2021   Lab Results  Component Value Date   CREATININE 0.84 10/20/2021   BUN 30  (H) 10/20/2021   NA 133 (L) 10/20/2021   K 3.8 10/20/2021   CL 97 (L) 10/20/2021   CO2 28 10/20/2021      Latest Ref Rng & Units 09/01/2021    8:05 AM 11/04/2020    8:20 AM 03/17/2020    8:21 AM  CBC  WBC 3.8 - 10.8 Thousand/uL 5.8  5.9  8.1   Hemoglobin 11.7 - 15.5 g/dL 38.1  82.9  93.7   Hematocrit 35.0 - 45.0 % 35.6  36.8  34.4   Platelets 140 - 400 Thousand/uL 238  221  304     No results found for: "HGBA1C" Lab Results  Component Value Date   TSH 1.23 09/01/2021    ==================================================  COVID-19 Education: The signs and symptoms of COVID-19 were discussed with the patient and how to seek care for testing (follow up with PCP or arrange E-visit).    I spent a total of 26 minutes with the patient spent in direct patient consultation.  Additional time spent with chart review  / charting (studies, outside notes, etc): 22 min Total Time: 48 min  Current medicines are reviewed at length with the patient today.  (+/- concerns) none  This visit occurred during the SARS-CoV-2 public health emergency.  Safety protocols were in place, including screening questions prior to the visit, additional usage of staff PPE, and extensive cleaning of exam room while observing appropriate contact time as indicated for disinfecting solutions.  Notice: This dictation was prepared with Dragon dictation along with smart phrase technology. Any transcriptional errors that result from this process are unintentional and may not be corrected upon review.  Studies Ordered:   Orders Placed This Encounter  Procedures   EKG 12-Lead   No orders of the defined types were placed in this encounter.   Patient Instructions / Medication Changes & Studies & Tests Ordered   Patient Instructions  Medication Instructions:   No changes *If you need a refill on your cardiac medications before your next appointment, please call your pharmacy*   Lab Work: Not  needed    Testing/Procedures:  Not needed  Follow-Up: At [facility], you and your health needs are our priority.  As part of our continuing mission to provide you with exceptional heart care, we have created designated Provider Care Teams.  These Care Teams include your primary Cardiologist (physician) and Advanced Practice Providers (APPs -  Physician Assistants and Nurse Practitioners) who all work together to provide you with the care you need, when you need it.     Your next appointment:   6 month(s)  The format for your next appointment:   In Person  Provider:   Bryan Lemma, MD    Other Instructions  Glenetta Hew, M.D., M.S. Interventional Cardiologist   Pager # [phone] Phone # [phone] [address] [place], [place] [address]   Thank you for choosing Heartcare at [facility]!!

## 2022-03-08 ENCOUNTER — Non-Acute Institutional Stay: Payer: Medicare PPO | Admitting: Internal Medicine

## 2022-03-08 VITALS — BP 132/56 | HR 90 | Temp 96.6°F | Ht 62.0 in | Wt 131.5 lb

## 2022-03-08 DIAGNOSIS — M545 Low back pain, unspecified: Secondary | ICD-10-CM

## 2022-03-08 DIAGNOSIS — R194 Change in bowel habit: Secondary | ICD-10-CM

## 2022-03-08 DIAGNOSIS — I351 Nonrheumatic aortic (valve) insufficiency: Secondary | ICD-10-CM | POA: Diagnosis not present

## 2022-03-08 DIAGNOSIS — E871 Hypo-osmolality and hyponatremia: Secondary | ICD-10-CM

## 2022-03-08 DIAGNOSIS — I10 Essential (primary) hypertension: Secondary | ICD-10-CM

## 2022-03-08 DIAGNOSIS — F339 Major depressive disorder, recurrent, unspecified: Secondary | ICD-10-CM | POA: Diagnosis not present

## 2022-03-08 DIAGNOSIS — E7849 Other hyperlipidemia: Secondary | ICD-10-CM

## 2022-03-08 DIAGNOSIS — M810 Age-related osteoporosis without current pathological fracture: Secondary | ICD-10-CM

## 2022-03-08 DIAGNOSIS — G8929 Other chronic pain: Secondary | ICD-10-CM

## 2022-03-08 DIAGNOSIS — J410 Simple chronic bronchitis: Secondary | ICD-10-CM

## 2022-03-08 NOTE — Progress Notes (Unsigned)
Location:      Place of Service:     Provider:   Code Status: DNR Goals of Care:     01/02/2022   10:59 AM  Advanced Directives  Does Patient Have a Medical Advance Directive? Yes  Type of Advance Directive Out of facility DNR (pink MOST or yellow form)  Does patient want to make changes to medical advance directive? No - Patient declined  Pre-existing out of facility DNR order (yellow form or pink MOST form) Pink MOST/Yellow Form most recent copy in chart - Physician notified to receive inpatient order     Chief Complaint  Patient presents with   Medical Management of Chronic Issues    HPI: Patient is a 88 y.o. female seen today for medical management of chronic diseases.    Has h/o Severe AS with AR Has decided not to undergo TAVR. Continues to be stable  Depression and anxiety Stable  Chronic back pain Has seen neurosurgery before Alternating diarrhea and constipation questionable IBS.  Has seen GI. COPD HLD, hypertension   Rarely uses Tramadol fro her Back pain Does not want ot Taper Zoloft Continuous with Diarrhea alteranting with Constipation   Past Medical History:  Diagnosis Date   Arthritis    COPD (chronic obstructive pulmonary disease) (HCC)    Depression    Diverticulosis    GERD (gastroesophageal reflux disease)    Hyperlipemia    Hypertension    Irregular heart beat 2014   PVCs   Moderate to severe aortic insufficiency 07/2019   Echo (03/08/2020): Aortic stenosis is now severe with mean gradient 60 mmHg. Aortic regurgitation is moderate to severe.   Osteoporosis    Severe aortic stenosis 06/11/2019   Echo 06/2019: EF 60-65%. Mild basal septal LVH, GR 2 DD. Mod LA dilation.- High LVEDP. Severe AI, Mod AS-peak gradient 43 mmHg, mean 28 mmHg;; 02/2020: Mean AoV Gradient 60 mmHg with Mod AI. EF 70-755.  Mod LVH. Mod LA dilation. Mod MR.    Past Surgical History:  Procedure Laterality Date   ABDOMINAL HYSTERECTOMY     both appy and hyst done  together   APPENDECTOMY     CATARACT EXTRACTION     TRANSTHORACIC ECHOCARDIOGRAM  06/2019   EF 60-65%. Mild basal septal LVH, GR 2 DD. Mod LA dilation.- High LVEDP. Severe AI, Mod AS-peak gradient 43 mmHg, mean 28 mmHg.Marland Kitchen   TRANSTHORACIC ECHOCARDIOGRAM  09/06/2020   EF 60-65%.  Normal LV fxn.  Mod Conc LVH of basal septal segment.  Indeterminate filling pattern (likely Gr2DD w/ Mod LA dilation).  Nl RV size and function.  Mildly increased PAP ~ 39.5 mmHg.  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  Mild-Mod TR.  Severe AoV Ca++ -> Mod-Severe AI & AS (mean gradient 33 mmHg) - ? lower b.c lower LVEF?    Allergies  Allergen Reactions   Fosamax [Alendronate Sodium]     Poor healing to mouth wound   Augmentin [Amoxicillin-Pot Clavulanate] Nausea Only    DID THE REACTION INVOLVE: Swelling of the face/tongue/throat, SOB, or low BP? No Sudden or severe rash/hives, skin peeling, or the inside of the mouth or nose? No Did it require medical treatment? No When did it last happen?       If all above answers are "NO", may proceed with cephalosporin use.    Hydrocodone Nausea And Vomiting   Mobic [Meloxicam] Rash   Sulfonamide Derivatives Rash    Outpatient Encounter  Medications as of 03/08/2022  Medication Sig   acetaminophen (TYLENOL) 650 MG CR tablet Take 650 mg by mouth in the morning, at noon, and at bedtime.    amLODipine (NORVASC) 2.5 MG tablet TAKE 1 TABLET BY MOUTH EVERY DAY   aspirin 81 MG tablet Take 81 mg by mouth daily.   lovastatin (MEVACOR) 40 MG tablet TAKE 1 TABLET BY MOUTH EVERY DAY   MELATONIN PO Take 2 mg by mouth at bedtime as needed.   pantoprazole (PROTONIX) 20 MG tablet Take 20 mg by mouth daily as needed.   Probiotic Product (PROBIOTIC DAILY PO) Take 1 capsule by mouth daily.   sertraline (ZOLOFT) 100 MG tablet TAKE 1 TABLET BY MOUTH EVERY DAY   SPIRIVA HANDIHALER 18 MCG inhalation capsule INHALE 1 CAPSULE USING THE HANDIHALER DEVICE ONCE A DAY    traMADol (ULTRAM) 50 MG tablet Take 1 tablet (50 mg total) by mouth every 6 (six) hours as needed for moderate pain.   valsartan-hydrochlorothiazide (DIOVAN-HCT) 320-12.5 MG tablet TAKE 1 TABLET BY MOUTH EVERY DAY   Vitamin D, Ergocalciferol, (DRISDOL) 1.25 MG (50000 UNIT) CAPS capsule TAKE 1 CAPSULE BY MOUTH ONE TIME PER WEEK   No facility-administered encounter medications on file as of 03/08/2022.    Review of Systems:  Review of Systems  Constitutional:  Negative for activity change and appetite change.  HENT: Negative.    Respiratory:  Negative for cough and shortness of breath.   Cardiovascular:  Negative for leg swelling.  Gastrointestinal:  Negative for constipation.  Genitourinary: Negative.   Musculoskeletal:  Positive for back pain. Negative for arthralgias, gait problem and myalgias.  Skin: Negative.   Neurological:  Negative for dizziness and weakness.  Psychiatric/Behavioral:  Negative for confusion, dysphoric mood and sleep disturbance.     Health Maintenance  Topic Date Due   Pneumonia Vaccine 40+ Years old (2 - PCV) 11/08/2014   COVID-19 Vaccine (5 - Moderna series) 10/25/2021   INFLUENZA VACCINE  03/28/2022   TETANUS/TDAP  11/06/2022   DEXA SCAN  Completed   HPV VACCINES  Aged Out   Zoster Vaccines- Shingrix  Discontinued    Physical Exam: Vitals:   03/08/22 1546  BP: (!) 132/56  Pulse: 90  Temp: (!) 96.6 F (35.9 C)  SpO2: 98%  Weight: 131 lb 8 oz (59.6 kg)  Height: 5\' 2"  (1.575 m)   Body mass index is 24.05 kg/m. Physical Exam Vitals reviewed.  Constitutional:      Appearance: Normal appearance.  HENT:     Head: Normocephalic.     Nose: Nose normal.     Mouth/Throat:     Mouth: Mucous membranes are moist.     Pharynx: Oropharynx is clear.  Eyes:     Pupils: Pupils are equal, round, and reactive to light.  Cardiovascular:     Rate and Rhythm: Normal rate and regular rhythm.     Pulses: Normal pulses.     Heart sounds: Murmur heard.   Pulmonary:     Effort: Pulmonary effort is normal.     Breath sounds: Normal breath sounds.  Abdominal:     General: Abdomen is flat. Bowel sounds are normal.     Palpations: Abdomen is soft.  Musculoskeletal:        General: No swelling.     Cervical back: Neck supple.  Skin:    General: Skin is warm.  Neurological:     General: No focal deficit present.     Mental Status: She is alert  and oriented to person, place, and time.  Psychiatric:        Mood and Affect: Mood normal.        Thought Content: Thought content normal.     Labs reviewed: Basic Metabolic Panel: Recent Labs    09/01/21 0805 10/20/21 0800  NA 132* 133*  K 4.3 3.8  CL 95* 97*  CO2 31 28  GLUCOSE 90 80  BUN 22 30*  CREATININE 0.89 0.84  CALCIUM 9.4 9.2  TSH 1.23  --    Liver Function Tests: Recent Labs    09/01/21 0805  AST 16  ALT 11  BILITOT 0.6  PROT 6.8   No results for input(s): "LIPASE", "AMYLASE" in the last 8760 hours. No results for input(s): "AMMONIA" in the last 8760 hours. CBC: Recent Labs    09/01/21 0805  WBC 5.8  NEUTROABS 3,758  HGB 12.0  HCT 35.6  MCV 89.0  PLT 238   Lipid Panel: Recent Labs    09/01/21 0805  CHOL 173  HDL 75  LDLCALC 84  TRIG 61  CHOLHDL 2.3   No results found for: "HGBA1C"  Procedures since last visit: No results found.  Assessment/Plan 1. Chronic bilateral low back pain without sciatica Tylenol  Tramadol PRN Does not want Robaxin or Neurontin right now Using Walker now and it is helping some Says she was seen By Dr Jule Ser before. No Surgery candidate 2. Severe aortic insufficiency and AS Follows with Cardiology No Active symptoms - CBC with Differential/Platelet; Future  3. Essential hypertension Diovan And Norvasc - COMPLETE METABOLIC PANEL WITH GFR; Future  4. Depression, recurrent (HCC) On Zoloft Mood stable  5. Simple chronic bronchitis (HCC) On Spiriva  6. Osteoporosis, unspecified osteoporosis type,  unspecified pathological fracture presence On High doses of Vit D Will check the level  Received Reclast for 8 years Does not want Prolia right now Discussed her DEXA today - Vitamin D, 25-hydroxy; Future  7. Hyponatremia stable - COMPLETE METABOLIC PANEL WITH GFR; Future  8. Other hyperlipidemia On statin - Lipid panel; Future  9. Change in bowel habit Has seen GI before  Refuses Shingrix and Covid Booster right now  Labs/tests ordered:  * No order type specified * Next appt:  Visit date not found

## 2022-03-12 ENCOUNTER — Other Ambulatory Visit: Payer: Self-pay | Admitting: Internal Medicine

## 2022-03-12 ENCOUNTER — Encounter: Payer: Self-pay | Admitting: Cardiology

## 2022-03-12 DIAGNOSIS — E785 Hyperlipidemia, unspecified: Secondary | ICD-10-CM | POA: Insufficient documentation

## 2022-03-12 NOTE — Assessment & Plan Note (Signed)
Asymptomatic.  Did not do well with beta-blocker.

## 2022-03-12 NOTE — Assessment & Plan Note (Signed)
Well-controlled BP on low-dose amlodipine high-dose valsartan and HCTZ.

## 2022-03-12 NOTE — Assessment & Plan Note (Signed)
Severe-moderate to severe AAS with a drop in EF. She remains asymptomatic and limited more by arthritis pains in her joints and back.  Previous visits would indicate that she has made the decision that she does not want any invasive procedures and therefore we decided to stop checking surveillance echocardiograms.  Plan will be to monitor for symptoms and reduce risk factors.  Continue BP control but not aggressive, continue statin and aspirin.

## 2022-03-12 NOTE — Assessment & Plan Note (Signed)
She is on Mevacor with relatively well-controlled lipids as of January.  LDL was 84.  PCP following up with upcoming labs soon.  I would not be more aggressive.

## 2022-03-12 NOTE — Assessment & Plan Note (Signed)
Moderate to severe AI.  No plans for further dilation with the designs on TAVR. Simply treat BP and symptoms.  So far no heart failure symptoms.

## 2022-03-13 ENCOUNTER — Other Ambulatory Visit: Payer: Self-pay | Admitting: Internal Medicine

## 2022-03-13 DIAGNOSIS — I351 Nonrheumatic aortic (valve) insufficiency: Secondary | ICD-10-CM | POA: Diagnosis not present

## 2022-03-13 DIAGNOSIS — E7849 Other hyperlipidemia: Secondary | ICD-10-CM | POA: Diagnosis not present

## 2022-03-13 DIAGNOSIS — I10 Essential (primary) hypertension: Secondary | ICD-10-CM

## 2022-03-13 DIAGNOSIS — E871 Hypo-osmolality and hyponatremia: Secondary | ICD-10-CM

## 2022-03-13 DIAGNOSIS — M810 Age-related osteoporosis without current pathological fracture: Secondary | ICD-10-CM

## 2022-03-13 LAB — COMPLETE METABOLIC PANEL WITH GFR
AG Ratio: 1.6 (calc) (ref 1.0–2.5)
ALT: 13 U/L (ref 6–29)
AST: 18 U/L (ref 10–35)
Albumin: 4.3 g/dL (ref 3.6–5.1)
Alkaline phosphatase (APISO): 74 U/L (ref 37–153)
BUN/Creatinine Ratio: 30 (calc) — ABNORMAL HIGH (ref 6–22)
BUN: 27 mg/dL — ABNORMAL HIGH (ref 7–25)
CO2: 31 mmol/L (ref 20–32)
Calcium: 9.6 mg/dL (ref 8.6–10.4)
Chloride: 96 mmol/L — ABNORMAL LOW (ref 98–110)
Creat: 0.9 mg/dL (ref 0.60–0.95)
Globulin: 2.7 g/dL (calc) (ref 1.9–3.7)
Glucose, Bld: 93 mg/dL (ref 65–99)
Potassium: 5 mmol/L (ref 3.5–5.3)
Sodium: 134 mmol/L — ABNORMAL LOW (ref 135–146)
Total Bilirubin: 0.5 mg/dL (ref 0.2–1.2)
Total Protein: 7 g/dL (ref 6.1–8.1)
eGFR: 62 mL/min/{1.73_m2} (ref >=60)

## 2022-03-13 LAB — LIPID PANEL
Cholesterol: 175 mg/dL (ref <200)
HDL: 79 mg/dL (ref >=50)
LDL Cholesterol (Calc): 81 mg/dL (calc)
Non-HDL Cholesterol (Calc): 96 mg/dL (calc) (ref <130)
Total CHOL/HDL Ratio: 2.2 (calc) (ref <5.0)
Triglycerides: 71 mg/dL (ref <150)

## 2022-03-13 LAB — CBC WITH DIFFERENTIAL/PLATELET
Absolute Monocytes: 504 cells/uL (ref 200–950)
Basophils Absolute: 62 cells/uL (ref 0–200)
Basophils Relative: 0.9 %
Eosinophils Absolute: 117 cells/uL (ref 15–500)
Eosinophils Relative: 1.7 %
HCT: 38.1 % (ref 35.0–45.0)
Hemoglobin: 12.7 g/dL (ref 11.7–15.5)
Lymphs Abs: 1835 cells/uL (ref 850–3900)
MCH: 30.7 pg (ref 27.0–33.0)
MCHC: 33.3 g/dL (ref 32.0–36.0)
MCV: 92 fL (ref 80.0–100.0)
MPV: 9.3 fL (ref 7.5–12.5)
Monocytes Relative: 7.3 %
Neutro Abs: 4382 cells/uL (ref 1500–7800)
Neutrophils Relative %: 63.5 %
Platelets: 270 10*3/uL (ref 140–400)
RBC: 4.14 10*6/uL (ref 3.80–5.10)
RDW: 12.5 % (ref 11.0–15.0)
Total Lymphocyte: 26.6 %
WBC: 6.9 10*3/uL (ref 3.8–10.8)

## 2022-03-13 LAB — VITAMIN D 25 HYDROXY (VIT D DEFICIENCY, FRACTURES): Vit D, 25-Hydroxy: 111 ng/mL — ABNORMAL HIGH (ref 30–100)

## 2022-03-14 ENCOUNTER — Other Ambulatory Visit: Payer: Self-pay | Admitting: Internal Medicine

## 2022-03-31 ENCOUNTER — Other Ambulatory Visit: Payer: Self-pay | Admitting: Internal Medicine

## 2022-03-31 NOTE — Telephone Encounter (Signed)
Patient has request refill on medication Vitamin D. Patient medication isnt on medication list. Medication was discontinued. Medication pend and sent to PCP Mahlon Gammon, MD for approval.

## 2022-03-31 NOTE — Telephone Encounter (Signed)
Please tell her she does not need such high doses of Vit D She can just take OTC vit d 2000 units

## 2022-04-13 ENCOUNTER — Other Ambulatory Visit: Payer: Self-pay | Admitting: Internal Medicine

## 2022-08-07 ENCOUNTER — Encounter: Payer: Self-pay | Admitting: Cardiology

## 2022-08-07 ENCOUNTER — Ambulatory Visit: Payer: Medicare PPO | Attending: Cardiology | Admitting: Cardiology

## 2022-08-07 VITALS — BP 118/56 | HR 87 | Ht 62.5 in | Wt 130.4 lb

## 2022-08-07 DIAGNOSIS — I340 Nonrheumatic mitral (valve) insufficiency: Secondary | ICD-10-CM | POA: Diagnosis not present

## 2022-08-07 DIAGNOSIS — I352 Nonrheumatic aortic (valve) stenosis with insufficiency: Secondary | ICD-10-CM

## 2022-08-07 DIAGNOSIS — I10 Essential (primary) hypertension: Secondary | ICD-10-CM | POA: Diagnosis not present

## 2022-08-07 DIAGNOSIS — I350 Nonrheumatic aortic (valve) stenosis: Secondary | ICD-10-CM

## 2022-08-07 DIAGNOSIS — E785 Hyperlipidemia, unspecified: Secondary | ICD-10-CM

## 2022-08-07 DIAGNOSIS — I493 Ventricular premature depolarization: Secondary | ICD-10-CM | POA: Diagnosis not present

## 2022-08-07 NOTE — Progress Notes (Signed)
Primary Care Provider: Mahlon GammonGupta, Anjali L, MD [facility] Cardiologist: Bryan Lemmaavid Samiah Ricklefs, MD Electrophysiologist: None  Clinic Note: Chief Complaint  Patient presents with   Follow-up    6 months.  Stable.   Aortic Stenosis    Mild progression evidence of shortness of breath on exertion, not interested in further management.   ===================================  ASSESSMENT/PLAN   Problem List Items Addressed This Visit       Cardiology Problems   Severe aortic stenosis by prior echocardiogram - Primary (Chronic)    Progression to severe aortic stenosis as well as noted drop in EF.  Still remains asymptomatic other than exertional dyspnea which is multifactorial related to joint back pain.  She is quite confident in her decision that she did not want to proceed with further evaluation as such, we will not continue with surveillance echocardiography.  Monitor and treat symptoms.      Relevant Orders   EKG 12-Lead (Completed)   Nonrheumatic aortic insufficiency with aortic stenosis (Chronic)    Moderate to severe AI on follow-up echocardiogram..   Patient is indicated that she would not want to have SAVR or TAVR-therefore we have decided to forego further noninvasive monitoring.  Continue to treat symptoms with BP, heart rate and CHF management.  No active CHF at this point.  Has notable increased pulse pressure because of AI.  Need to be careful about over doing afterload reduction.  Avoid dehydration.      Relevant Orders   EKG 12-Lead (Completed)   PVC's (premature ventricular contractions) (Chronic)    Doing relatively well.  No longer on beta-blocker as she did not tolerate due to fatigue.      Essential hypertension (Chronic)   Dyslipidemia, goal LDL below 100 (Chronic)    Labs relatively well-controlled as of July on Mevacor.  She seems to be tolerating it well.  Continue to follow-up with PCP.  Would have low threshold to cut out any medications that  would make her symptoms and symptomatic.  Would not be more aggressive.      Mitral valve insufficiency (Chronic)    Myxomatous mitral valve noted.  There are similar reasons, no further evaluation as she is not interested in invasive management.  Continue supportive care.       ===================================  HPI:    Robin [name] Froman is a 88 y.o. female with a PMH notable for SEVERE AORTIC STENOSIS/AORTIC INSUFFICIENCY along with HTN, HLD and palpitations with underlying COPD who presents today for 1863-month follow-up.  Robin [name] Sellinger was last seen on February 17, 2022.  She was pretty much stable, and really limited by back pain as far as mobility.  Not very active.  Had some URI type symptoms.  Able to walk around nursing facility using a walker but uses a cane around the apartment.  If she is going any further than that she will use the wheelchair.  No chest pain or pressure at rest or exertion.  Just a little exertional dyspnea.  She Walks Further Than Usual.  No Real PND, Orthopnea with Trivial Edema. => She has indicated that she would not want to go forward with any further cardiac evaluation for her aortic valve.  No plans to test further.  Recent Hospitalizations: None  Reviewed  CV studies:    The following studies were reviewed today: (if available, images/films reviewed: From Epic Chart or Care Everywhere) No recent studies:  Interval History:   Robin [name] Vaden presents here today for follow-up  is noticing that she continues to have notable exertional dyspnea getting dressed, showering and doing some routine ADLs.  She is able to walk around the house and perform some of her chores without significant dyspnea.  No chest pain or pressure.  She says that she is limited as much by her back and hip pain with walking as she is due to dyspnea.  No  Exertional chest pain or pressure.  No PND, orthopnea or edema.  She denies any rapid irregular heartbeats palpitations.  No syncope  or near syncope.  No TIA or amaurosis fugax.  No claudication.  She notes exercise intolerance, but again is limited by unsteady gait, back and hip pain.  REVIEWED OF SYSTEMS   Review of Systems  Constitutional:  Positive for malaise/fatigue. Negative for weight loss.  HENT:  Negative for congestion.   Respiratory:  Positive for shortness of breath (With minimal positive exertion.). Negative for cough and wheezing.   Cardiovascular:        Per HPI  Gastrointestinal:  Negative for blood in stool, diarrhea and melena.  Genitourinary:  Negative for hematuria.  Musculoskeletal:  Positive for back pain and joint pain (Hips).  Neurological:  Positive for dizziness (Sometimes standing up too fast.) and focal weakness (Both legs seem very weak.).  Endo/Heme/Allergies:  Positive for environmental allergies. Does not bruise/bleed easily.  Psychiatric/Behavioral:  Positive for memory loss. Negative for depression. The patient is not nervous/anxious and does not have insomnia.    I have reviewed and (if needed) personally updated the patient's problem list, medications, allergies, past medical and surgical history, social and family history.   PAST MEDICAL HISTORY   Past Medical History:  Diagnosis Date   Arthritis    COPD (chronic obstructive pulmonary disease) ([place])    Depression    Diverticulosis    GERD (gastroesophageal reflux disease)    Hyperlipemia    Hypertension    Irregular heart beat 2014   PVCs   Moderate to severe aortic insufficiency 07/2019   Echo (03/08/2020): Aortic stenosis is now severe with mean gradient 60 mmHg. Aortic regurgitation is moderate to severe.   Osteoporosis    Severe aortic stenosis 06/11/2019   Echo 06/2019: EF 60-65%. Mild basal septal LVH, GR 2 DD. Mod LA dilation.- High LVEDP. Severe AI, Mod AS-peak gradient 43 mmHg, mean 28 mmHg;; 02/2020: Mean AoV Gradient 60 mmHg with Mod AI. EF 70-755.  Mod LVH. Mod LA dilation. Mod MR.    PAST SURGICAL HISTORY    Past Surgical History:  Procedure Laterality Date   ABDOMINAL HYSTERECTOMY     both appy and hyst done together   APPENDECTOMY     CATARACT EXTRACTION     TRANSTHORACIC ECHOCARDIOGRAM  06/2019   EF 60-65%. Mild basal septal LVH, GR 2 DD. Mod LA dilation.- High LVEDP. Severe AI, Mod AS-peak gradient 43 mmHg, mean 28 mmHg.Marland Kitchen   TRANSTHORACIC ECHOCARDIOGRAM  09/06/2020   EF 60-65%.  Normal LV fxn.  Mod Conc LVH of basal septal segment.  Indeterminate filling pattern (likely Gr2DD w/ Mod LA dilation).  Nl RV size and function.  Mildly increased PAP ~ 39.5 mmHg.  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  Mild-Mod TR.  Severe AoV Ca++ -> Mod-Severe AI & AS (mean gradient 33 mmHg) - ? lower b.c lower LVEF?    Immunization History  Administered Date(s) Administered   Influenza, High Dose Seasonal PF 06/11/2019   Influenza-Unspecified 05/18/2020, 06/24/2021  Moderna SARS-COV2 Booster Vaccination 02/02/2021   Moderna Sars-Covid-2 Vaccination 09/01/2019, 09/29/2019, 07/12/2020   Pfizer Covid-19 Vaccine Bivalent Booster 104yrs & up 06/24/2021   Pneumococcal Polysaccharide-23 11/07/2013   Tdap 11/05/2012    MEDICATIONS/ALLERGIES   Current Meds  Medication Sig   acetaminophen (TYLENOL) 650 MG CR tablet Take 650 mg by mouth in the morning, at noon, and at bedtime.    amLODipine (NORVASC) 2.5 MG tablet TAKE 1 TABLET BY MOUTH EVERY DAY   aspirin 81 MG tablet Take 81 mg by mouth daily.   lovastatin (MEVACOR) 40 MG tablet TAKE 1 TABLET BY MOUTH EVERY DAY   MELATONIN PO Take 2 mg by mouth at bedtime as needed.   pantoprazole (PROTONIX) 20 MG tablet Take 20 mg by mouth daily as needed.   Probiotic Product (PROBIOTIC DAILY PO) Take 1 capsule by mouth daily.   sertraline (ZOLOFT) 100 MG tablet TAKE 1 TABLET BY MOUTH EVERY DAY   SPIRIVA HANDIHALER 18 MCG inhalation capsule INHALE 1 CAPSULE USING THE HANDIHALER DEVICE ONCE A DAY   traMADol (ULTRAM) 50 MG tablet Take 1 tablet  (50 mg total) by mouth every 6 (six) hours as needed for moderate pain.   [Refilled] valsartan-hydrochlorothiazide (DIOVAN-HCT) 320-12.5 MG tablet TAKE 1 TABLET BY MOUTH EVERY DAY    Allergies  Allergen Reactions   Fosamax [Alendronate Sodium]     Poor healing to mouth wound   Augmentin [Amoxicillin-Pot Clavulanate] Nausea Only    DID THE REACTION INVOLVE: Swelling of the face/tongue/throat, SOB, or low BP? No Sudden or severe rash/hives, skin peeling, or the inside of the mouth or nose? No Did it require medical treatment? No When did it last happen?       If all above answers are "NO", may proceed with cephalosporin use.    Hydrocodone Nausea And Vomiting   Mobic [Meloxicam] Rash   Sulfonamide Derivatives Rash    SOCIAL HISTORY/FAMILY HISTORY   Reviewed in Epic:  Pertinent findings:  Social History   Tobacco Use   Smoking status: Former    Packs/day: 1.00    Years: 30.00    Total pack years: 30.00    Types: Cigarettes    Quit date: 08/28/2008    Years since quitting: 14.0   Smokeless tobacco: Never  Vaping Use   Vaping Use: Never used  Substance Use Topics   Alcohol use: Yes    Alcohol/week: 14.0 standard drinks of alcohol    Types: 14 Standard drinks or equivalent per week    Comment: Every now and then   Drug use: No   Social History   Social History Narrative   Currently remarried.  New husband has significant cardiac history.  (Followed by Dr. Ellyn Hack)   She is a retired Pharmacist, hospital.      She and her new husband Obie Dredge") moved to [facility] just prior to the outbreak of the pandemic in the Spring of 2020.    Obie Dredge - Daniel Desjardin) died 01-20-20 -> longstanding Parkinson's disease complicated by dementia.      --> Now finally able to work with PT/OT to rehab - chronic back pain with very unsteady gait/poor balance. -- Uses cane for short distance - but mostly uses Walker.  Really only walks to & from the cafeteria if not working with therapy.She does have a  pretty unsteady gait and has to either use a cane for short distances or uses a walker for longer distance. -->This is really because of her bad back and poor  balance.  As result of this, she really is not all that active, and is very upset about not being on to do her rehab.    OBJCTIVE -PE, EKG, labs   Wt Readings from Last 3 Encounters:  08/07/22 130 lb 6.4 oz (59.1 kg)  03/08/22 131 lb 8 oz (59.6 kg)  02/17/22 131 lb 12.8 oz (59.8 kg)    Physical Exam: BP (!) 118/56 (BP Location: Left Arm, Patient Position: Sitting, Cuff Size: Normal)   Pulse 87   Ht 5' 2.5" (1.588 [name])   Wt 130 lb 6.4 oz (59.1 kg)   SpO2 96%   BMI 23.47 kg/[name]  Physical Exam Vitals reviewed.  Constitutional:      General: She is not in acute distress.    Appearance: Normal appearance. She is normal weight. She is not ill-appearing or toxic-appearing.     Comments: Pleasant, somewhat frail elderly woman.  HENT:     Head: Normocephalic and atraumatic.  Neck:     Vascular: Decreased carotid pulses. No carotid bruit (Radiated aortic murmur), hepatojugular reflux or JVD.  Cardiovascular:     Rate and Rhythm: Normal rate and regular rhythm. Occasional Extrasystoles are present.    Pulses: Intact distal pulses. No decreased pulses.     Heart sounds: S1 normal and S2 normal. Murmur heard.     High-pitched harsh crescendo-decrescendo mid to late systolic murmur is present with a grade of 4/6 at the upper right sternal border radiating to the neck.     High-pitched blowing decrescendo holosystolic murmur of grade 2/6 is also present at the apex radiating to the apex.     High-pitched blowing decrescendo early diastolic murmur is present with a grade of 2/4 at the upper right sternal border radiating to the apex.     No friction rub. Gallop present. S4 sounds present.  Pulmonary:     Effort: Pulmonary effort is normal. No respiratory distress.     Breath sounds: Normal breath sounds. No wheezing, rhonchi or rales.   Musculoskeletal:        General: No swelling.     Cervical back: Normal range of motion and neck supple.  Skin:    General: Skin is warm and dry.  Neurological:     Mental Status: She is alert.     Gait: Gait abnormal (Walks with a cane.  Somewhat unsteady.).     Adult ECG Report  Rate: 87 ;  Rhythm: normal sinus rhythm and 1  AVB, anterior, age-indeterminate. ;   Narrative Interpretation: Stable  Recent Labs: Reviewed Lab Results  Component Value Date   CHOL 175 03/13/2022   HDL 79 03/13/2022   LDLCALC 81 03/13/2022   TRIG 71 03/13/2022   CHOLHDL 2.2 03/13/2022   Lab Results  Component Value Date   CREATININE 0.90 03/13/2022   BUN 27 (H) 03/13/2022   NA 134 (L) 03/13/2022   K 5.0 03/13/2022   CL 96 (L) 03/13/2022   CO2 31 03/13/2022      Latest Ref Rng & Units 03/13/2022    7:55 AM 03/10/2022    8:00 AM 09/01/2021    8:05 AM  CBC  WBC 3.8 - 10.8 Thousand/uL 6.9  CANCELED  5.8   Hemoglobin 11.7 - 15.5 g/dL 83.1   51.7   Hematocrit 35.0 - 45.0 % 38.1   35.6   Platelets 140 - 400 Thousand/uL 270   238     No results found for: "HGBA1C" Lab Results  Component Value Date  TSH 1.23 09/01/2021    ================================================== I spent a total of 19 minutes with the patient spent in direct patient consultation.  Additional time spent with chart review  / charting (studies, outside notes, etc): 12 min Total Time: 30 min  Current medicines are reviewed at length with the patient today.  (+/- concerns) none  Notice: This dictation was prepared with Dragon dictation along with smart phrase technology. Any transcriptional errors that result from this process are unintentional and may not be corrected upon review.  Studies Ordered:   Orders Placed This Encounter  Procedures   EKG 12-Lead   No orders of the defined types were placed in this encounter.   Patient Instructions / Medication Changes & Studies & Tests Ordered   Patient  Instructions  Medication Instructions:  No changes  *If you need a refill on your cardiac medications before your next appointment, please call your pharmacy*   Lab Work: Not needed If y  Testing/Procedures: Not needed   Follow-Up: At [facility], you and your health needs are our priority.  As part of our continuing mission to provide you with exceptional heart care, we have created designated Provider Care Teams.  These Care Teams include your primary Cardiologist (physician) and Advanced Practice Providers (APPs -  Physician Assistants and Nurse Practitioners) who all work together to provide you with the care you need, when you need it.     Your next appointment:   6 month(s)  The format for your next appointment:   In Person  Provider:   Glenetta Hew, MD     Leonie Man, MD, MS Glenetta Hew, [name].D., [name].S. Interventional Cardiologist  [facility]  Pager # [phone] Phone # [phone] [address] [place], [place] [address]   Thank you for choosing [facility]!!

## 2022-08-07 NOTE — Patient Instructions (Signed)
Medication Instructions:  No changes  *If you need a refill on your cardiac medications before your next appointment, please call your pharmacy*   Lab Work: Not needed If y  Testing/Procedures: Not needed   Follow-Up: At [facility], you and your health needs are our priority.  As part of our continuing mission to provide you with exceptional heart care, we have created designated Provider Care Teams.  These Care Teams include your primary Cardiologist (physician) and Advanced Practice Providers (APPs -  Physician Assistants and Nurse Practitioners) who all work together to provide you with the care you need, when you need it.     Your next appointment:   6 month(s)  The format for your next appointment:   In Person  Provider:   Bryan Lemma, MD

## 2022-08-09 ENCOUNTER — Other Ambulatory Visit: Payer: Self-pay | Admitting: Internal Medicine

## 2022-08-28 ENCOUNTER — Encounter: Payer: Self-pay | Admitting: Cardiology

## 2022-08-28 NOTE — Assessment & Plan Note (Signed)
Doing relatively well.  No longer on beta-blocker as she did not tolerate due to fatigue.

## 2022-08-28 NOTE — Assessment & Plan Note (Signed)
Myxomatous mitral valve noted.  There are similar reasons, no further evaluation as she is not interested in invasive management.  Continue supportive care.

## 2022-08-28 NOTE — Assessment & Plan Note (Signed)
Moderate to severe AI on follow-up echocardiogram..   Patient is indicated that she would not want to have SAVR or TAVR-therefore we have decided to forego further noninvasive monitoring.  Continue to treat symptoms with BP, heart rate and CHF management.  No active CHF at this point.  Has notable increased pulse pressure because of AI.  Need to be careful about over doing afterload reduction.  Avoid dehydration.

## 2022-08-28 NOTE — Assessment & Plan Note (Signed)
Progression to severe aortic stenosis as well as noted drop in EF.  Still remains asymptomatic other than exertional dyspnea which is multifactorial related to joint back pain.  She is quite confident in her decision that she did not want to proceed with further evaluation as such, we will not continue with surveillance echocardiography.  Monitor and treat symptoms.

## 2022-08-28 NOTE — Assessment & Plan Note (Signed)
Labs relatively well-controlled as of July on Mevacor.  She seems to be tolerating it well.  Continue to follow-up with PCP.  Would have low threshold to cut out any medications that would make her symptoms and symptomatic.  Would not be more aggressive.

## 2022-09-10 ENCOUNTER — Other Ambulatory Visit: Payer: Self-pay | Admitting: Internal Medicine

## 2022-09-13 ENCOUNTER — Encounter: Payer: Self-pay | Admitting: Internal Medicine

## 2022-09-13 ENCOUNTER — Non-Acute Institutional Stay: Payer: Medicare PPO | Admitting: Internal Medicine

## 2022-09-13 VITALS — BP 142/70 | HR 96 | Temp 97.7°F | Resp 16 | Ht 63.0 in | Wt 132.4 lb

## 2022-09-13 DIAGNOSIS — I10 Essential (primary) hypertension: Secondary | ICD-10-CM | POA: Diagnosis not present

## 2022-09-13 DIAGNOSIS — I351 Nonrheumatic aortic (valve) insufficiency: Secondary | ICD-10-CM | POA: Diagnosis not present

## 2022-09-13 DIAGNOSIS — J410 Simple chronic bronchitis: Secondary | ICD-10-CM | POA: Diagnosis not present

## 2022-09-13 DIAGNOSIS — G8929 Other chronic pain: Secondary | ICD-10-CM

## 2022-09-13 DIAGNOSIS — F339 Major depressive disorder, recurrent, unspecified: Secondary | ICD-10-CM | POA: Diagnosis not present

## 2022-09-13 DIAGNOSIS — E7849 Other hyperlipidemia: Secondary | ICD-10-CM

## 2022-09-13 DIAGNOSIS — E871 Hypo-osmolality and hyponatremia: Secondary | ICD-10-CM | POA: Diagnosis not present

## 2022-09-13 DIAGNOSIS — M810 Age-related osteoporosis without current pathological fracture: Secondary | ICD-10-CM

## 2022-09-13 DIAGNOSIS — M545 Low back pain, unspecified: Secondary | ICD-10-CM | POA: Diagnosis not present

## 2022-09-13 MED ORDER — PANTOPRAZOLE SODIUM 20 MG PO TBEC: 20.0000 mg | DELAYED_RELEASE_TABLET | Freq: Every day | ORAL | 3 refills | Status: DC | PRN

## 2022-09-13 NOTE — Progress Notes (Signed)
Location:  [facility] Service:  Clinic ([id number])  Provider:   Code Status: DNR Goals of Care:     09/13/2022    2:14 PM  Advanced Directives  Does Patient Have a Medical Advance Directive? Yes  Type of Estate agent of [place];Living will;Out of facility DNR (pink MOST or yellow form)  Does patient want to make changes to medical advance directive? No - Patient declined  Copy of Healthcare Power of Attorney in Chart? No - copy requested     Chief Complaint  Patient presents with   Medical Management of Chronic Issues    6 month follow up   Quality Metric Gaps    Discussed the need for Pneumonia vaccine and Covid vaccine    HPI: Patient is a 89 y.o. female seen today for medical management of chronic diseases.    Lives in [place] in [facility]  Has h/o Severe AS with AR Has decided not to undergo TAVR. Continues to be stable  Seen by cardiologist No SOB   Depression and anxiety Stable Doing well   Chronic back pain Has seen neurosurgery before Alternating diarrhea and constipation questionable IBS.  Has seen GI. COPD HLD, hypertension    No Acute issues  Past Medical History:  Diagnosis Date   Arthritis    COPD (chronic obstructive pulmonary disease) (HCC)    Depression    Diverticulosis    GERD (gastroesophageal reflux disease)    Hyperlipemia    Hypertension    Irregular heart beat 2014   PVCs   Moderate to severe aortic insufficiency 07/2019   Echo (03/08/2020): Aortic stenosis is now severe with mean gradient 60 mmHg. Aortic regurgitation is moderate to severe.   Osteoporosis    Severe aortic stenosis 06/11/2019   Echo 06/2019: EF 60-65%. Mild basal septal LVH, GR 2 DD. Mod LA dilation.- High LVEDP. Severe AI, Mod AS-peak gradient 43 mmHg, mean 28 mmHg;; 02/2020: Mean AoV Gradient 60 mmHg with Mod AI. EF 70-755.  Mod LVH. Mod LA dilation. Mod MR.    Past Surgical History:  Procedure Laterality Date   ABDOMINAL  HYSTERECTOMY     both appy and hyst done together   APPENDECTOMY     CATARACT EXTRACTION     TRANSTHORACIC ECHOCARDIOGRAM  06/2019   EF 60-65%. Mild basal septal LVH, GR 2 DD. Mod LA dilation.- High LVEDP. Severe AI, Mod AS-peak gradient 43 mmHg, mean 28 mmHg.Marland Kitchen   TRANSTHORACIC ECHOCARDIOGRAM  09/06/2020   EF 60-65%.  Normal LV fxn.  Mod Conc LVH of basal septal segment.  Indeterminate filling pattern (likely Gr2DD w/ Mod LA dilation).  Nl RV size and function.  Mildly increased PAP ~ 39.5 mmHg.  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  Mild-Mod TR.  Severe AoV Ca++ -> Mod-Severe AI & AS (mean gradient 33 mmHg) - ? lower b.c lower LVEF?    Allergies  Allergen Reactions   Fosamax [Alendronate Sodium]     Poor healing to mouth wound   Augmentin [Amoxicillin-Pot Clavulanate] Nausea Only    DID THE REACTION INVOLVE: Swelling of the face/tongue/throat, SOB, or low BP? No Sudden or severe rash/hives, skin peeling, or the inside of the mouth or nose? No Did it require medical treatment? No When did it last happen?       If all above answers are "NO", may proceed with cephalosporin use.    Hydrocodone Nausea And Vomiting  Mobic [Meloxicam] Rash   Sulfonamide Derivatives Rash    Outpatient Encounter Medications as of 09/13/2022  Medication Sig   acetaminophen (TYLENOL) 650 MG CR tablet Take 650 mg by mouth in the morning, at noon, and at bedtime.    amLODipine (NORVASC) 2.5 MG tablet TAKE 1 TABLET BY MOUTH EVERY DAY   aspirin 81 MG tablet Take 81 mg by mouth daily.   lovastatin (MEVACOR) 40 MG tablet TAKE 1 TABLET BY MOUTH EVERY DAY   MELATONIN PO Take 2 mg by mouth at bedtime as needed.   Probiotic Product (PROBIOTIC DAILY PO) Take 1 capsule by mouth daily.   sertraline (ZOLOFT) 100 MG tablet TAKE 1 TABLET BY MOUTH EVERY DAY   SPIRIVA HANDIHALER 18 MCG inhalation capsule INHALE 1 CAPSULE USING THE HANDIHALER DEVICE ONCE A DAY   valsartan-hydrochlorothiazide  (DIOVAN-HCT) 320-12.5 MG tablet TAKE 1 TABLET BY MOUTH EVERY DAY   [DISCONTINUED] pantoprazole (PROTONIX) 20 MG tablet Take 20 mg by mouth daily as needed.   pantoprazole (PROTONIX) 20 MG tablet Take 1 tablet (20 mg total) by mouth daily as needed.   [DISCONTINUED] traMADol (ULTRAM) 50 MG tablet Take 1 tablet (50 mg total) by mouth every 6 (six) hours as needed for moderate pain. (Patient not taking: Reported on 09/13/2022)   No facility-administered encounter medications on file as of 09/13/2022.    Review of Systems:  Review of Systems  Constitutional:  Negative for activity change and appetite change.  HENT: Negative.    Respiratory:  Negative for cough and shortness of breath.   Cardiovascular:  Negative for leg swelling.  Gastrointestinal:  Negative for constipation.  Genitourinary: Negative.   Musculoskeletal:  Negative for arthralgias, gait problem and myalgias.  Skin: Negative.   Neurological:  Negative for dizziness and weakness.  Psychiatric/Behavioral:  Negative for confusion, dysphoric mood and sleep disturbance.     Health Maintenance  Topic Date Due   Pneumonia Vaccine 50+ Years old (2 - PCV) 11/08/2014   COVID-19 Vaccine (6 - 2023-24 season) 09/29/2022 (Originally 04/28/2022)   DTaP/Tdap/Td (2 - Td or Tdap) 11/06/2022   Medicare Annual Wellness (AWV)  01/03/2023   INFLUENZA VACCINE  Completed   DEXA SCAN  Completed   HPV VACCINES  Aged Out   Zoster Vaccines- Shingrix  Discontinued    Physical Exam: Vitals:   09/13/22 1411  BP: (!) 142/70  Pulse: 96  Resp: 16  Temp: 97.7 F (36.5 C)  TempSrc: Temporal  SpO2: 93%  Weight: 132 lb 6.4 oz (60.1 kg)  Height: 5\' 3"  (1.6 m)   Body mass index is 23.45 kg/m. Physical Exam Vitals reviewed.  Constitutional:      Appearance: Normal appearance.  HENT:     Head: Normocephalic.     Right Ear: Tympanic membrane normal.     Left Ear: Tympanic membrane normal.     Nose: Nose normal.     Mouth/Throat:     Mouth:  Mucous membranes are moist.     Pharynx: Oropharynx is clear.  Eyes:     Pupils: Pupils are equal, round, and reactive to light.  Cardiovascular:     Rate and Rhythm: Normal rate and regular rhythm.     Pulses: Normal pulses.     Heart sounds: Murmur heard.  Pulmonary:     Effort: Pulmonary effort is normal.     Breath sounds: Normal breath sounds.  Abdominal:     General: Abdomen is flat. Bowel sounds are normal.     Palpations: Abdomen  is soft.  Musculoskeletal:        General: No swelling.     Cervical back: Neck supple.  Skin:    General: Skin is warm.  Neurological:     General: No focal deficit present.     Mental Status: She is alert and oriented to person, place, and time.  Psychiatric:        Mood and Affect: Mood normal.        Thought Content: Thought content normal.     Labs reviewed: Basic Metabolic Panel: Recent Labs    10/20/21 0800 03/10/22 0800 03/13/22 0755  NA 133*  --  134*  K 3.8  --  5.0  CL 97*  --  96*  CO2 28  --  31  GLUCOSE 80 CANCELED 93  BUN 30*  --  27*  CREATININE 0.84  --  0.90  CALCIUM 9.2  --  9.6   Liver Function Tests: Recent Labs    03/13/22 0755  AST 18  ALT 13  BILITOT 0.5  PROT 7.0   No results for input(s): "LIPASE", "AMYLASE" in the last 8760 hours. No results for input(s): "AMMONIA" in the last 8760 hours. CBC: Recent Labs    03/10/22 0800 03/13/22 0755  WBC CANCELED 6.9  NEUTROABS  --  4,382  HGB  --  12.7  HCT  --  38.1  MCV  --  92.0  PLT  --  270   Lipid Panel: Recent Labs    03/10/22 0800 03/13/22 0755  CHOL  --  175  HDL  --  79  LDLCALC CANCELED 81  TRIG  --  71  CHOLHDL  --  2.2   No results found for: "HGBA1C"  Procedures since last visit: No results found.  Assessment/Plan 1. Chronic bilateral low back pain without sciatica Tylenol  2. Severe aortic insufficiency and AS Stable  3. Essential hypertension Diovan and Amlodipine  4. Depression, recurrent ([place]) Zoloft  5.  Simple chronic bronchitis (HCC) Spiriva  6. Osteoporosis, unspecified osteoporosis type, unspecified pathological fracture presence Received Reclast for 8 years Does not want Prolia right now DEXA in 12/22 Femur -2.0 Radius -3.7 7. Hyponatremia Repeat BMP  8. Other hyperlipidemia Repeat Lipid on statin    Labs/tests ordered:  * No order type specified * Next appt:  01/08/2023

## 2022-09-21 ENCOUNTER — Other Ambulatory Visit: Payer: Medicare PPO

## 2022-09-21 DIAGNOSIS — E7849 Other hyperlipidemia: Secondary | ICD-10-CM

## 2022-09-21 DIAGNOSIS — I10 Essential (primary) hypertension: Secondary | ICD-10-CM | POA: Diagnosis not present

## 2022-09-21 DIAGNOSIS — E871 Hypo-osmolality and hyponatremia: Secondary | ICD-10-CM

## 2022-09-21 LAB — COMPLETE METABOLIC PANEL WITH GFR
AG Ratio: 1.5 (calc) (ref 1.0–2.5)
ALT: 9 U/L (ref 6–29)
AST: 14 U/L (ref 10–35)
Albumin: 4 g/dL (ref 3.6–5.1)
Alkaline phosphatase (APISO): 64 U/L (ref 37–153)
BUN/Creatinine Ratio: 39 (calc) — ABNORMAL HIGH (ref 6–22)
BUN: 37 mg/dL — ABNORMAL HIGH (ref 7–25)
CO2: 27 mmol/L (ref 20–32)
Calcium: 9.4 mg/dL (ref 8.6–10.4)
Chloride: 102 mmol/L (ref 98–110)
Creat: 0.96 mg/dL — ABNORMAL HIGH (ref 0.60–0.95)
Globulin: 2.7 g/dL (calc) (ref 1.9–3.7)
Glucose, Bld: 89 mg/dL (ref 65–99)
Potassium: 4 mmol/L (ref 3.5–5.3)
Sodium: 138 mmol/L (ref 135–146)
Total Bilirubin: 0.5 mg/dL (ref 0.2–1.2)
Total Protein: 6.7 g/dL (ref 6.1–8.1)
eGFR: 57 mL/min/{1.73_m2} — ABNORMAL LOW (ref >=60)

## 2022-09-21 LAB — CBC WITH DIFFERENTIAL/PLATELET
Absolute Monocytes: 405 cells/uL (ref 200–950)
Basophils Absolute: 38 cells/uL (ref 0–200)
Basophils Relative: 0.7 %
Eosinophils Absolute: 103 cells/uL (ref 15–500)
Eosinophils Relative: 1.9 %
HCT: 36 % (ref 35.0–45.0)
Hemoglobin: 12.3 g/dL (ref 11.7–15.5)
Lymphs Abs: 1582 cells/uL (ref 850–3900)
MCH: 31.2 pg (ref 27.0–33.0)
MCHC: 34.2 g/dL (ref 32.0–36.0)
MCV: 91.4 fL (ref 80.0–100.0)
MPV: 9.8 fL (ref 7.5–12.5)
Monocytes Relative: 7.5 %
Neutro Abs: 3272 cells/uL (ref 1500–7800)
Neutrophils Relative %: 60.6 %
Platelets: 203 10*3/uL (ref 140–400)
RBC: 3.94 10*6/uL (ref 3.80–5.10)
RDW: 12.5 % (ref 11.0–15.0)
Total Lymphocyte: 29.3 %
WBC: 5.4 10*3/uL (ref 3.8–10.8)

## 2022-09-21 LAB — LIPID PANEL
Cholesterol: 158 mg/dL (ref <200)
HDL: 61 mg/dL (ref >=50)
LDL Cholesterol (Calc): 80 mg/dL (calc)
Non-HDL Cholesterol (Calc): 97 mg/dL (calc) (ref <130)
Total CHOL/HDL Ratio: 2.6 (calc) (ref <5.0)
Triglycerides: 90 mg/dL (ref <150)

## 2022-09-21 LAB — SEDIMENTATION RATE: Sed Rate: 6 mm/h (ref 0–30)

## 2022-09-21 LAB — EXTRA SPECIMEN

## 2022-11-08 ENCOUNTER — Other Ambulatory Visit: Payer: Self-pay | Admitting: Internal Medicine

## 2022-11-14 DIAGNOSIS — Z85828 Personal history of other malignant neoplasm of skin: Secondary | ICD-10-CM | POA: Diagnosis not present

## 2022-11-14 DIAGNOSIS — L570 Actinic keratosis: Secondary | ICD-10-CM | POA: Diagnosis not present

## 2022-11-14 DIAGNOSIS — D1801 Hemangioma of skin and subcutaneous tissue: Secondary | ICD-10-CM | POA: Diagnosis not present

## 2022-11-14 DIAGNOSIS — L853 Xerosis cutis: Secondary | ICD-10-CM | POA: Diagnosis not present

## 2022-11-14 DIAGNOSIS — L821 Other seborrheic keratosis: Secondary | ICD-10-CM | POA: Diagnosis not present

## 2022-12-11 ENCOUNTER — Other Ambulatory Visit: Payer: Self-pay | Admitting: Internal Medicine

## 2023-01-08 ENCOUNTER — Encounter: Payer: Self-pay | Admitting: Orthopedic Surgery

## 2023-01-08 ENCOUNTER — Non-Acute Institutional Stay (INDEPENDENT_AMBULATORY_CARE_PROVIDER_SITE_OTHER): Payer: Medicare PPO | Admitting: Orthopedic Surgery

## 2023-01-08 VITALS — BP 132/68 | HR 89 | Temp 97.3°F | Resp 16 | Ht 63.0 in | Wt 132.8 lb

## 2023-01-08 DIAGNOSIS — Z0000 Encounter for general adult medical examination without abnormal findings: Secondary | ICD-10-CM | POA: Diagnosis not present

## 2023-01-08 NOTE — Patient Instructions (Signed)
  [name] , Thank you for taking time to come for your Medicare Wellness Visit. I appreciate your ongoing commitment to your health goals. Please review the following plan we discussed and let me know if I can assist you in the future.   These are the goals we discussed:  Goals      Maintain Mobility and Function     Evidence-based guidance:  Emphasize the importance of physical activity and aerobic exercise as included in treatment plan; assess barriers to adherence; consider patient's abilities and preferences.  Encourage gradual increase in activity or exercise instead of stopping if pain occurs.  Reinforce individual therapy exercise prescription, such as strengthening, stabilization and stretching programs.  Promote optimal body mechanics to stabilize the spine with lifting and functional activity.  Encourage activity and mobility modifications to facilitate optimal function, such as using a log roll for bed mobility or dressing from a seated position.  Reinforce individual adaptive equipment recommendations to limit excessive spinal movements, such as a Event organiser.  Assess adequacy of sleep; encourage use of sleep hygiene techniques, such as bedtime routine; use of white noise; dark, cool bedroom; avoiding daytime naps, heavy meals or exercise before bedtime.  Promote positions and modification to optimize sleep and sexual activity; consider pillows or positioning devices to assist in maintaining neutral spine.  Explore options for applying ergonomic principles at work and home, such as frequent position changes, using ergonomically designed equipment and working at optimal height.  Promote modifications to increase comfort with driving such as lumbar support, optimizing seat and steering wheel position, using cruise control and taking frequent rest stops to stretch and walk.   Notes:      Patient Stated     In 09/23/20 goal is that she would like to have more energy          This is a list of the screening recommended for you and due dates:  Health Maintenance  Topic Date Due   COVID-19 Vaccine (6 - 2023-24 season) 04/28/2022   DTaP/Tdap/Td vaccine (2 - Td or Tdap) 11/06/2022   Flu Shot  03/29/2023   Medicare Annual Wellness Visit  01/08/2024   Pneumonia Vaccine  Completed   DEXA scan (bone density measurement)  Completed   HPV Vaccine  Aged Out   Zoster (Shingles) Vaccine  Discontinued   Recommend tdap and Shingrix vaccine at local pharmacy. Off Prolia> discussed Caltrate supplement and weigh bearing exercise for bone health

## 2023-01-08 NOTE — Progress Notes (Signed)
Subjective:   Robin [name] is a 89 y.o. female who presents for Medicare Annual (Subsequent) preventive examination.  Place of Service: [facility] Provider: Hazle Nordmann, AGNP-C   Review of Systems     Cardiac Risk Factors include: advanced age (>35men, >66 women);hypertension;sedentary lifestyle     Objective:    Today's Vitals   01/08/23 1422  BP: (!) 113/56  Pulse: 89  Resp: 16  Temp: (!) 97.3 F (36.3 C)  SpO2: 94%  Weight: 132 lb 12.8 oz (60.2 kg)  Height: 5\' 3"  (1.6 m)   Body mass index is 23.52 kg/m.     01/08/2023    2:29 PM 09/13/2022    2:14 PM 01/02/2022   10:59 AM 03/09/2021    3:51 PM 09/23/2020    3:54 PM 09/04/2018   10:00 PM 09/04/2018    7:47 PM  Advanced Directives  Does Patient Have a Medical Advance Directive? Yes Yes Yes Yes Yes Yes Yes  Type of Estate agent of [place];Living will;Out of facility DNR (pink MOST or yellow form) Healthcare Power of [place];Living will;Out of facility DNR (pink MOST or yellow form) Out of facility DNR (pink MOST or yellow form) Living will;Healthcare Power of Attorney Out of facility DNR (pink MOST or yellow form) Healthcare Power of [place];Living will Healthcare Power of [place];Living will  Does patient want to make changes to medical advance directive? No - Patient declined No - Patient declined No - Patient declined No - Patient declined No - Patient declined No - Patient declined No - Patient declined  Copy of Healthcare Power of Attorney in Chart? No - copy requested No - copy requested  Yes - validated most recent copy scanned in chart (See row information)  No - copy requested No - copy requested  Pre-existing out of facility DNR order (yellow form or pink MOST form)   Pink MOST/Yellow Form most recent copy in chart - Physician notified to receive inpatient order  Pink MOST form placed in chart (order not valid for inpatient use)      Current Medications (verified) Outpatient  Encounter Medications as of 01/08/2023  Medication Sig   acetaminophen (TYLENOL) 650 MG CR tablet Take 650 mg by mouth in the morning, at noon, and at bedtime.    amLODipine (NORVASC) 2.5 MG tablet TAKE 1 TABLET BY MOUTH EVERY DAY   aspirin 81 MG tablet Take 81 mg by mouth daily.   lovastatin (MEVACOR) 40 MG tablet TAKE 1 TABLET BY MOUTH EVERY DAY   MELATONIN PO Take 2 mg by mouth at bedtime as needed.   pantoprazole (PROTONIX) 20 MG tablet TAKE 1 TABLET BY MOUTH EVERY DAY AS NEEDED   Probiotic Product (PROBIOTIC DAILY PO) Take 1 capsule by mouth daily.   sertraline (ZOLOFT) 100 MG tablet TAKE 1 TABLET BY MOUTH EVERY DAY   SPIRIVA HANDIHALER 18 MCG inhalation capsule INHALE 1 CAPSULE USING THE HANDIHALER DEVICE ONCE A DAY   valsartan-hydrochlorothiazide (DIOVAN-HCT) 320-12.5 MG tablet TAKE 1 TABLET BY MOUTH EVERY DAY   No facility-administered encounter medications on file as of 01/08/2023.    Allergies (verified) Fosamax [alendronate sodium], Augmentin [amoxicillin-pot clavulanate], Hydrocodone, Mobic [meloxicam], and Sulfonamide derivatives   History: Past Medical History:  Diagnosis Date   Arthritis    COPD (chronic obstructive pulmonary disease) (HCC)    Depression    Diverticulosis    GERD (gastroesophageal reflux disease)    Hyperlipemia    Hypertension    Irregular heart beat  2014   PVCs   Moderate to severe aortic insufficiency 07/2019   Echo (03/08/2020): Aortic stenosis is now severe with mean gradient 60 mmHg. Aortic regurgitation is moderate to severe.   Osteoporosis    Severe aortic stenosis 06/11/2019   Echo 06/2019: EF 60-65%. Mild basal septal LVH, GR 2 DD. Mod LA dilation.- High LVEDP. Severe AI, Mod AS-peak gradient 43 mmHg, mean 28 mmHg;; 02/2020: Mean AoV Gradient 60 mmHg with Mod AI. EF 70-755.  Mod LVH. Mod LA dilation. Mod MR.   Past Surgical History:  Procedure Laterality Date   ABDOMINAL HYSTERECTOMY     both appy and hyst done together   APPENDECTOMY      CATARACT EXTRACTION     TRANSTHORACIC ECHOCARDIOGRAM  06/2019   EF 60-65%. Mild basal septal LVH, GR 2 DD. Mod LA dilation.- High LVEDP. Severe AI, Mod AS-peak gradient 43 mmHg, mean 28 mmHg.Marland Kitchen   TRANSTHORACIC ECHOCARDIOGRAM  09/06/2020   EF 60-65%.  Normal LV fxn.  Mod Conc LVH of basal septal segment.  Indeterminate filling pattern (likely Gr2DD w/ Mod LA dilation).  Nl RV size and function.  Mildly increased PAP ~ 39.5 mmHg.  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  Mild-Mod TR.  Severe AoV Ca++ -> Mod-Severe AI & AS (mean gradient 33 mmHg) - ? lower b.c lower LVEF?   Family History  Problem Relation Age of Onset   Lung cancer Father    Cancer Sister        biliary   Hypertension Mother    Cancer Sister    Social History   Socioeconomic History   Marital status: Married    Spouse name: Not on file   Number of children: 2   Years of education: Not on file   Highest education level: Not on file  Occupational History   Occupation: retired-schoolteacher    Comment: Teacher  Tobacco Use   Smoking status: Former    Packs/day: 1.00    Years: 30.00    Additional pack years: 0.00    Total pack years: 30.00    Types: Cigarettes    Quit date: 08/28/2008    Years since quitting: 14.3   Smokeless tobacco: Never  Vaping Use   Vaping Use: Never used  Substance and Sexual Activity   Alcohol use: Not Currently    Comment: Every now and then   Drug use: No   Sexual activity: Not on file  Other Topics Concern   Not on file  Social History Narrative   Currently remarried.  New husband has significant cardiac history.  (Followed by Dr. Herbie Baltimore)   She is a retired Runner, broadcasting/film/video.      She and her new husband [name]") moved to [facility] just prior to the outbreak of the pandemic in the Spring of 2020.    [name] - Nashae Gaier) died Jan 24, 2020 -> longstanding Parkinson's disease complicated by dementia.      --> Now finally able to work with PT/OT to rehab -  chronic back pain with very unsteady gait/poor balance. -- Uses cane for short distance - but mostly uses Walker.  Really only walks to & from the cafeteria if not working with therapy.She does have a pretty unsteady gait and has to either use a cane for short distances or uses a walker for longer distance. -->This is really because of her bad back and poor balance.  As result of this, she really is  not all that active, and is very upset about not being on to do her rehab.   Social Determinants of Health   Financial Resource Strain: Low Risk  (01/08/2023)   Overall Financial Resource Strain (CARDIA)    Difficulty of Paying Living Expenses: Not hard at all  Food Insecurity: No Food Insecurity (01/08/2023)   Hunger Vital Sign    Worried About Running Out of Food in the Last Year: Never true    Ran Out of Food in the Last Year: Never true  Transportation Needs: No Transportation Needs (01/08/2023)   PRAPARE - Administrator, Civil Service (Medical): No    Lack of Transportation (Non-Medical): No  Physical Activity: Inactive (01/08/2023)   Exercise Vital Sign    Days of Exercise per Week: 0 days    Minutes of Exercise per Session: 0 min  Stress: No Stress Concern Present (01/08/2023)   [employer] of Occupational Health - Occupational Stress Questionnaire    Feeling of Stress : Only a little  Social Connections: Socially Isolated (01/08/2023)   Social Connection and Isolation Panel [NHANES]    Frequency of Communication with Friends and Family: More than three times a week    Frequency of Social Gatherings with Friends and Family: More than three times a week    Attends Religious Services: Never    Database administrator or Organizations: No    Attends Banker Meetings: Never    Marital Status: Widowed    Tobacco Counseling Counseling given: Not Answered   Clinical Intake:  Pre-visit preparation completed: No  Pain : No/denies pain     BMI - recorded:  23.52 Nutritional Status: BMI of 19-24  Normal Nutritional Risks: None Diabetes: No  How often do you need to have someone help you when you read instructions, pamphlets, or other written materials from your doctor or pharmacy?: 2 - Rarely What is the last grade level you completed in school?: College  Diabetic?No  Interpreter Needed?: No      Activities of Daily Living    01/08/2023    2:49 PM  In your present state of health, do you have any difficulty performing the following activities:  Hearing? 0  Vision? 0  Difficulty concentrating or making decisions? 0  Walking or climbing stairs? 1  Dressing or bathing? 0  Doing errands, shopping? 0  Preparing Food and eating ? N  Using the Toilet? N  In the past six months, have you accidently leaked urine? Y  Do you have problems with loss of bowel control? N  Managing your Medications? N  Managing your Finances? N  Housekeeping or managing your Housekeeping? N    Patient Care Team: Mahlon Gammon, MD as PCP - General (Internal Medicine) Marykay Lex, MD as PCP - Cardiology (Cardiology)  Indicate any recent Medical Services you may have received from other than [facility] providers in the past year (date may be approximate).     Assessment:   This is a routine wellness examination for [place].  Hearing/Vision screen Hearing Screening - Comments:: No hearing concerns.  Vision Screening - Comments:: No vision concerns. Patient can't remember last eye exam. Patient wears reading glasses that are not prescription.   Dietary issues and exercise activities discussed: Current Exercise Habits: The patient does not participate in regular exercise at present, Exercise limited by: cardiac condition(s);orthopedic condition(s)   Goals Addressed             This Visit's Progress  Maintain Mobility and Function   On track    Evidence-based guidance:  Emphasize the importance of physical activity and aerobic exercise as  included in treatment plan; assess barriers to adherence; consider patient's abilities and preferences.  Encourage gradual increase in activity or exercise instead of stopping if pain occurs.  Reinforce individual therapy exercise prescription, such as strengthening, stabilization and stretching programs.  Promote optimal body mechanics to stabilize the spine with lifting and functional activity.  Encourage activity and mobility modifications to facilitate optimal function, such as using a log roll for bed mobility or dressing from a seated position.  Reinforce individual adaptive equipment recommendations to limit excessive spinal movements, such as a Event organiser.  Assess adequacy of sleep; encourage use of sleep hygiene techniques, such as bedtime routine; use of white noise; dark, cool bedroom; avoiding daytime naps, heavy meals or exercise before bedtime.  Promote positions and modification to optimize sleep and sexual activity; consider pillows or positioning devices to assist in maintaining neutral spine.  Explore options for applying ergonomic principles at work and home, such as frequent position changes, using ergonomically designed equipment and working at optimal height.  Promote modifications to increase comfort with driving such as lumbar support, optimizing seat and steering wheel position, using cruise control and taking frequent rest stops to stretch and walk.   Notes:      Patient Stated   On track    In 09/23/20 goal is that she would like to have more energy        Depression Screen    01/08/2023    2:48 PM 01/08/2023    2:28 PM 01/02/2022    2:28 PM 09/23/2020    3:51 PM  PHQ 2/9 Scores  PHQ - 2 Score 0 0 0 0    Fall Risk    01/08/2023    2:49 PM 01/08/2023    2:28 PM 09/13/2022    2:14 PM 01/02/2022    2:50 PM 01/02/2022    2:27 PM  Fall Risk   Falls in the past year? 0 0 0 0 0  Number falls in past yr: 0 0 0 0 0  Injury with Fall? 0 0 0 0 0  Risk for fall due  to : History of fall(s);Impaired balance/gait;Impaired mobility No Fall Risks Impaired mobility History of fall(s);Impaired balance/gait;Impaired mobility No Fall Risks  Follow up Falls evaluation completed;Education provided Falls evaluation completed Falls evaluation completed Falls evaluation completed;Education provided;Falls prevention discussed Falls evaluation completed    FALL RISK PREVENTION PERTAINING TO THE HOME:  Any stairs in or around the home? No  If so, are there any without handrails? No  Home free of loose throw rugs in walkways, pet beds, electrical cords, etc? Yes  Adequate lighting in your home to reduce risk of falls? Yes   ASSISTIVE DEVICES UTILIZED TO PREVENT FALLS:  Life alert? Yes  Use of a cane, walker or w/c? Yes  Grab bars in the bathroom? Yes  Shower chair or bench in shower? Yes  Elevated toilet seat or a handicapped toilet? Yes   TIMED UP AND GO:  Was the test performed? No .  Length of time to ambulate 10 feet: N/A sec.   Gait slow and steady with assistive device  Cognitive Function:    01/08/2023    2:29 PM 01/02/2022    2:28 PM  MMSE - Mini Mental State Exam  Orientation to time 4 5  Orientation to Place 5 5  Registration 3 3  Attention/ Calculation 5 5  Recall 3 3  Language- name 2 objects 2 2  Language- repeat 1 1  Language- follow 3 step command 3 2  Language- read & follow direction 1 1  Write a sentence 1 1  Copy design 1 0  Total score 29 28        09/23/2020    3:55 PM  6CIT Screen  What Year? 0 points  What month? 0 points  What time? 0 points  Count back from 20 0 points  Months in reverse 0 points  Repeat phrase 2 points  Total Score 2 points    Immunizations Immunization History  Administered Date(s) Administered   Fluad Quad(high Dose 65+) 04/28/2022   Influenza, High Dose Seasonal PF 06/11/2019   Influenza-Unspecified 05/18/2020, 06/24/2021   Moderna SARS-COV2 Booster Vaccination 02/02/2021   Moderna  Sars-Covid-2 Vaccination 09/01/2019, 09/29/2019, 07/12/2020   Pfizer Covid-19 Vaccine Bivalent Booster 73yrs & up 06/24/2021   Pneumococcal Conjugate-13 09/29/2022   Pneumococcal Polysaccharide-23 11/07/2013   Respiratory Syncytial Virus Vaccine,Recomb Aduvanted(Arexvy) 08/25/2022   Tdap 11/05/2012    TDAP status: Due, Education has been provided regarding the importance of this vaccine. Advised may receive this vaccine at local pharmacy or Health Dept. Aware to provide a copy of the vaccination record if obtained from local pharmacy or Health Dept. Verbalized acceptance and understanding.  Flu Vaccine status: Up to date  Pneumococcal vaccine status: Up to date  Covid-19 vaccine status: Completed vaccines  Qualifies for Shingles Vaccine? Yes   Zostavax completed Yes   Shingrix Completed?: No.    Education has been provided regarding the importance of this vaccine. Patient has been advised to call insurance company to determine out of pocket expense if they have not yet received this vaccine. Advised may also receive vaccine at local pharmacy or Health Dept. Verbalized acceptance and understanding.  Screening Tests Health Maintenance  Topic Date Due   COVID-19 Vaccine (6 - 2023-24 season) 04/28/2022   DTaP/Tdap/Td (2 - Td or Tdap) 11/06/2022   INFLUENZA VACCINE  03/29/2023   Medicare Annual Wellness (AWV)  01/08/2024   Pneumonia Vaccine 85+ Years old  Completed   DEXA SCAN  Completed   HPV VACCINES  Aged Out   Zoster Vaccines- Shingrix  Discontinued    Health Maintenance  Health Maintenance Due  Topic Date Due   COVID-19 Vaccine (6 - 2023-24 season) 04/28/2022   DTaP/Tdap/Td (2 - Td or Tdap) 11/06/2022    Colorectal cancer screening: No longer required.   Mammogram status: No longer required due to advanced age.  Bone Density status: Completed 07/2021. Results reflect: Bone density results: OSTEOPOROSIS. Repeat every 2 years.  Lung Cancer Screening: (Low Dose CT Chest  recommended if Age 90-80 years, 30 pack-year currently smoking OR have quit w/in 15years.) does not qualify.   Lung Cancer Screening Referral: No  Additional Screening:  Hepatitis C Screening: does not qualify; Completed   Vision Screening: Recommended annual ophthalmology exams for early detection of glaucoma and other disorders of the eye. Is the patient up to date with their annual eye exam?  No  Who is the provider or what is the name of the office in which the patient attends annual eye exams? Dr. Blima Ledger If pt is not established with a provider, would they like to be referred to a provider to establish care? No .   Dental Screening: Recommended annual dental exams for proper oral hygiene  Community Resource Referral / Chronic Care Management: CRR required  this visit?  No   CCM required this visit?  No      Plan:     I have personally reviewed and noted the following in the patient's chart:   Medical and social history Use of alcohol, tobacco or illicit drugs  Current medications and supplements including opioid prescriptions. Patient is not currently taking opioid prescriptions. Functional ability and status Nutritional status Physical activity Advanced directives List of other physicians Hospitalizations, surgeries, and ER visits in previous 12 months Vitals Screenings to include cognitive, depression, and falls Referrals and appointments  In addition, I have reviewed and discussed with patient certain preventive protocols, quality metrics, and best practice recommendations. A written personalized care plan for preventive services as well as general preventive health recommendations were provided to patient.     Octavia Heir, NP   01/08/2023   Nurse Notes: Recommend tdap and Shingrix vaccine at local pharmacy. Off [facility]> discussed Caltrate supplement and weigh bearing exercise for bone health

## 2023-01-17 ENCOUNTER — Encounter: Payer: Medicare PPO | Admitting: Internal Medicine

## 2023-01-26 ENCOUNTER — Other Ambulatory Visit: Payer: Self-pay | Admitting: Internal Medicine

## 2023-02-20 ENCOUNTER — Encounter: Payer: Self-pay | Admitting: Cardiology

## 2023-02-20 ENCOUNTER — Ambulatory Visit: Payer: Medicare PPO | Attending: Cardiology | Admitting: Cardiology

## 2023-02-20 VITALS — BP 117/66 | HR 85 | Ht 60.0 in | Wt 133.2 lb

## 2023-02-20 DIAGNOSIS — I493 Ventricular premature depolarization: Secondary | ICD-10-CM

## 2023-02-20 DIAGNOSIS — I351 Nonrheumatic aortic (valve) insufficiency: Secondary | ICD-10-CM

## 2023-02-20 DIAGNOSIS — J438 Other emphysema: Secondary | ICD-10-CM

## 2023-02-20 DIAGNOSIS — I10 Essential (primary) hypertension: Secondary | ICD-10-CM

## 2023-02-20 DIAGNOSIS — I350 Nonrheumatic aortic (valve) stenosis: Secondary | ICD-10-CM

## 2023-02-20 DIAGNOSIS — I340 Nonrheumatic mitral (valve) insufficiency: Secondary | ICD-10-CM | POA: Diagnosis not present

## 2023-02-20 DIAGNOSIS — I352 Nonrheumatic aortic (valve) stenosis with insufficiency: Secondary | ICD-10-CM | POA: Diagnosis not present

## 2023-02-20 DIAGNOSIS — E785 Hyperlipidemia, unspecified: Secondary | ICD-10-CM

## 2023-02-20 MED ORDER — ALBUTEROL SULFATE HFA 108 (90 BASE) MCG/ACT IN AERS: 2.0000 | INHALATION_SPRAY | Freq: Four times a day (QID) | RESPIRATORY_TRACT | 3 refills | Status: DC | PRN

## 2023-02-20 NOTE — Progress Notes (Signed)
Primary Care Provider: Mahlon Gammon, MD [facility] Cardiologist: Robin Lemma, MD Electrophysiologist: None  Clinic Note: Chief Complaint  Patient presents with   Follow-up   Cardiac Valve Problem    Famil stable.  Still no interest in further evaluation.     ===================================  ASSESSMENT/PLAN   Problem List Items Addressed This Visit       Cardiology Problems   Severe aortic stenosis by prior echocardiogram (Chronic)   Severe aortic insufficiency (Chronic)   PVC's (premature ventricular contractions) (Chronic)    Did not tolerate beta-blocker because of fatigue and wheezing.  Pretty well-controlled at this point.  If they recur, would switch from amlodipine to diltiazem.      Nonrheumatic aortic insufficiency with aortic stenosis - Primary (Chronic)    Moderate to severe AI/AS on echo.  Decided to forego further evaluation with plans for possible TAVR canceled. Continue to monitor medically.  Treat blood pressure for afterload reduction.  Discussed concerning symptoms.  I think her dyspnea is probably more related to COPD as opposed to CHF from valvular disease. No PND, orthopnea or edema.  Plan: Continue amlodipine 2.5 mg daily along with valsartan HCTZ.  Blood pressure will stabilize. Not on beta-blocker because of her COPD, but we could consider switching from Norvasc to diltiazem yesterday. Avoid dehydration.      Mitral valve insufficiency (Chronic)    Continue to work on afterload reduction.  Similarly not interested in further titration.  Continue supportive care.      Essential hypertension (Chronic)    Stable BP on current dose of amlodipine and high-dose valsartan and HCTZ.  Low threshold to switch from amlodipine to diltiazem.      Dyslipidemia, goal LDL below 100 (Chronic)    Lipids look pretty well-controlled on lovastatin.  Tolerating well.  Labs followed by PCP.        Other   COPD (chronic obstructive pulmonary  disease) (HCC) (Chronic)    She stopped taking her Breo, I recommended that she goes back on Breo--or discuss with PCP.  Will provide prescription for albuterol.      Relevant Medications   albuterol (VENTOLIN HFA) 108 (90 Base) MCG/ACT inhaler    ===================================  HPI:    @HCNARRATIVEHISTORYBEGIN @  Robin [name] is a 89 y.o. female with a PMH below who presents today for 13-month follow-up at the request of Robin Gammon, MD.  PMH  Significant AORTIC STENOSIS/INSUFFICIENCY (Mod-Severe AI/Severe AS,  along with Mod MR). Referred to valve clinic-seen by Dr. Clifton [name] in January 2022, per her wishes, no plans to pursue further evaluation or treatment.  Does not want TAVR. => As such, we have stopped doing follow-up studies. HTN, HLD, palpitations,  COPD => only using Spiriva.  Stopped taking Breo. GERD.  Robin [name] was last seen on August 07, 2022 for follow-up.  Was noticed exertional dyspnea at rest, showering and ADLs.  Also do some chores without significant dyspnea.  No chest pain or pressure.  Activity limited by back and hip pain no chest pain, PND or orthopnea.  Her echocardiogram showed evidence of progression to severe aortic stenosis with aortic insufficiency.  She indicated that she would not want to undergo SAVR or TAVR and therefore we decided to forego any further noninvasive testing.  Did not tolerate beta-blockers due to fatigue for PVCs.  Recent Hospitalizations: None  @HCNARRATIVEHISTORYEND @  Reviewed  CV studies:    The following studies were reviewed today: (if available, images/films reviewed: From  Epic Chart or Care Everywhere) None:  Interval History:   Robin [name] comes in today using her rolling walker.  She notes feeling somewhat dyspneic today.  Some wheezing.  She is pretty much worn out having just returned from a 2-week trip down to the beach with her son and grandkids.  She did everything she could keep up with the  family including going to the beach.  Perhaps the most notable thing was that it totally upset her stomach with constipation and loose stool.  Last probably why she is more fatigued right now than anything else.  She feels like she is just worn out after that long break, but enjoyed it thoroughly.  Once she is able to recover back again she should be okay.  She was able to go all over the place of the beach without too much of problem with shortness of breath, and had no chest pain or pressure.  CV Review of Symptoms (Summary): positive for - dyspnea on exertion and some exercise intolerance negative for - chest pain, edema, irregular heartbeat, orthopnea, palpitations, paroxysmal nocturnal dyspnea, rapid heart rate, shortness of breath, or syncope or near-syncope, TIA or amaurosis fugax, claudication  REVIEWED OF SYSTEMS   Notable Symptoms: Feeling tired and weak today.  Wheezing some.  Extremely exhausted from trip to the beach.  Noting GI issues with constipation and loose stools.      Positional dizziness/unsteady gait => Still walks with walker, troubled by back and hip pain.  Legs feel somewhat weak. Mild memory loss.   I have reviewed and (if needed) personally updated the patient's problem list, medications, allergies, past medical and surgical history, social and family history.   PAST MEDICAL HISTORY   Past Medical History:  Diagnosis Date   Arthritis    COPD (chronic obstructive pulmonary disease) (HCC)    Depression    Diverticulosis    GERD (gastroesophageal reflux disease)    Hyperlipemia    Hypertension    Irregular heart beat 2014   PVCs   Moderate to severe aortic insufficiency 07/2019   Echo (03/08/2020): Aortic stenosis is now severe with mean gradient 60 mmHg. Aortic regurgitation is moderate to severe.   Osteoporosis    Severe aortic stenosis 06/11/2019   Echo 06/2019: EF 60-65%. Mild basal septal LVH, GR 2 DD. Mod LA dilation.- High LVEDP. Severe AI, Mod AS-peak  gradient 43 mmHg, mean 28 mmHg;; 02/2020: Mean AoV Gradient 60 mmHg with Mod AI. EF 70-755.  Mod LVH. Mod LA dilation. Mod MR.    PAST SURGICAL HISTORY   Past Surgical History:  Procedure Laterality Date   ABDOMINAL HYSTERECTOMY     both appy and hyst done together   APPENDECTOMY     CATARACT EXTRACTION     TRANSTHORACIC ECHOCARDIOGRAM  09/09/2021   : EF 60 to 65%.  Normal LV size and function.  Mild LVH.  Indeterminate D Fxn-elevated LAP with Mod LA dilation.  Severe MAC with moderate MR/MS (mean gradient 9.1 mmHg).  Moderate AoV calcification with severe thickening-> Mod to Severe AS/AI; no change.   TRANSTHORACIC ECHOCARDIOGRAM  09/06/2020   EF 60-65%.  Normal LV fxn.  Mod Conc LVH of basal septal segment.  Indeterminate filling pattern (likely Gr2DD w/ Mod LA dilation).  Nl RV size and function.  Mildly increased PAP ~ 39.5 mmHg.  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  Mild-Mod TR.  Severe AoV Ca++ -> Mod-Severe AI &  AS (mean gradient 33 mmHg) - ? lower b.c lower LVEF?   MEDICATIONS/ALLERGIES   Current Meds  Medication Sig   acetaminophen (TYLENOL) 650 MG CR tablet Take 650 mg by mouth in the morning, at noon, and at bedtime.    amLODipine (NORVASC) 2.5 MG tablet TAKE 1 TABLET BY MOUTH EVERY DAY   aspirin 81 MG tablet Take 81 mg by mouth daily.   lovastatin (MEVACOR) 40 MG tablet TAKE 1 TABLET BY MOUTH EVERY DAY   MELATONIN PO Take 2 mg by mouth at bedtime as needed.   pantoprazole (PROTONIX) 20 MG tablet TAKE 1 TABLET BY MOUTH EVERY DAY AS NEEDED   Probiotic Product (PROBIOTIC DAILY PO) Take 1 capsule by mouth daily.   sertraline (ZOLOFT) 100 MG tablet TAKE 1 TABLET BY MOUTH EVERY DAY   SPIRIVA HANDIHALER 18 MCG inhalation capsule INHALE 1 CAPSULE USING THE HANDIHALER DEVICE ONCE A DAY   valsartan-hydrochlorothiazide (DIOVAN-HCT) 320-12.5 MG tablet TAKE 1 TABLET BY MOUTH EVERY DAY  She says that she is not using Breo; and needs refill on  albuterol  SOCIAL HISTORY/FAMILY HISTORY   Reviewed in Epic:  Pertinent findings: Former smoker 30 pack years, quit in January 2010; rare alcohol use.  Social History   Social History Narrative   Currently remarried.  New husband has significant cardiac history.  (Followed by Dr. Herbie Baltimore)   She is a retired Runner, broadcasting/film/video.      She and her new husband [name]") moved to [facility] just prior to the outbreak of the pandemic in the Spring of 2020.    [name] - Lynise Lavanway) died 01-19-2020 -> longstanding Parkinson's disease complicated by dementia.      --> Now finally able to work with PT/OT to rehab - chronic back pain with very unsteady gait/poor balance. -- Uses cane for short distance - but mostly uses Walker.  Really only walks to & from the cafeteria if not working with therapy.She does have a pretty unsteady gait and has to either use a cane for short distances or uses a walker for longer distance. -->This is really because of her bad back and poor balance.  As result of this, she really is not all that active, and is very upset about not being on to do her rehab.    OBJCTIVE -PE, EKG, labs   Wt Readings from Last 3 Encounters:  02/20/23 133 lb 3.2 oz (60.4 kg)  01/08/23 132 lb 12.8 oz (60.2 kg)  09/13/22 132 lb 6.4 oz (60.1 kg)    Physical Exam: BP 117/66 (BP Location: Left Arm, Patient Position: Sitting, Cuff Size: Normal)   Pulse 85   Ht 5' (1.524 m)   Wt 133 lb 3.2 oz (60.4 kg)   SpO2 97%   BMI 26.01 kg/m  Physical Exam Vitals reviewed.  Constitutional:      General: She is not in acute distress.    Appearance: Normal appearance. She is normal weight. She is not ill-appearing or toxic-appearing.     Comments: Seems little worn out today, but otherwise healthy-appearing.  HENT:     Head: Normocephalic and atraumatic.  Neck:     Vascular: Decreased carotid pulses. No carotid bruit or JVD.  Cardiovascular:     Rate and Rhythm: Normal rate and regular rhythm. Occasional  Extrasystoles are present.    Chest Wall: PMI is not displaced.     Pulses: Normal pulses.     Heart sounds: S1 normal and S2 normal. Murmur heard.  High-pitched blowing holosystolic murmur is present with a grade of 2/6 at the apex radiating to the axilla.     High-pitched blowing decrescendo early diastolic murmur is present at the upper right sternal border radiating to the apex.     No friction rub. Gallop present. S4 sounds present.  Pulmonary:     Effort: Pulmonary effort is normal. No respiratory distress (Little out of breath when she first walked in clinic.  Was able to call down after few minutes better.).     Breath sounds: Wheezing (Mild expiratory wheeze) present. No rhonchi or rales.  Chest:     Chest wall: No tenderness.  Musculoskeletal:        General: No swelling. Normal range of motion.     Cervical back: Normal range of motion.  Skin:    General: Skin is warm and dry.  Neurological:     General: No focal deficit present.     Mental Status: She is alert. Mental status is at baseline.     Gait: Gait abnormal (Walker.  Has Scoliosis.).  Psychiatric:        Mood and Affect: Mood normal.        Behavior: Behavior normal.        Thought Content: Thought content normal.        Judgment: Judgment normal.     Adult ECG Report Checked  Recent Labs: Reviewed Lab Results  Component Value Date   CHOL 158 09/21/2022   HDL 61 09/21/2022   LDLCALC 80 09/21/2022   TRIG 90 09/21/2022   CHOLHDL 2.6 09/21/2022   Lab Results  Component Value Date   CREATININE 0.96 (H) 09/21/2022   BUN 37 (H) 09/21/2022   NA 138 09/21/2022   K 4.0 09/21/2022   CL 102 09/21/2022   CO2 27 09/21/2022      Latest Ref Rng & Units 09/21/2022    8:20 AM 03/13/2022    7:55 AM 03/10/2022    8:00 AM  CBC  WBC 3.8 - 10.8 Thousand/uL 5.4  6.9  CANCELED   Hemoglobin 11.7 - 15.5 g/dL 54.0  98.1    Hematocrit 35.0 - 45.0 % 36.0  38.1    Platelets 140 - 400 Thousand/uL 203  270      No  results found for: "HGBA1C" Lab Results  Component Value Date   TSH 1.23 09/01/2021    ================================================== I spent a total of 26 minutes with the patient spent in direct patient consultation.  Additional time spent with chart review  / charting (studies, outside notes, etc): 15 min Total Time: 41 min  Current medicines are reviewed at length with the patient today.  (+/- concerns) N/A  Notice: This dictation was prepared with Dragon dictation along with smart phrase technology. Any transcriptional errors that result from this process are unintentional and may not be corrected upon review.  Studies Ordered:   No orders of the defined types were placed in this encounter.  Meds ordered this encounter  Medications   albuterol (VENTOLIN HFA) 108 (90 Base) MCG/ACT inhaler    Sig: Inhale 2 puffs into the lungs every 6 (six) hours as needed for wheezing or shortness of breath.    Dispense:  8 g    Refill:  3    Patient Instructions / Medication Changes & Studies & Tests Ordered   Patient Instructions  Medication Instructions:    Albuterol inhaler refilled - primary to refill in the future  *If you need a refill  on your cardiac medications before your next appointment, please call your pharmacy*   Lab Work: Not needed    Testing/Procedures: Not needed   Follow-Up: At [facility], you and your health needs are our priority.  As part of our continuing mission to provide you with exceptional heart care, we have created designated Provider Care Teams.  These Care Teams include your primary Cardiologist (physician) and Advanced Practice Providers (APPs -  Physician Assistants and Nurse Practitioners) who all work together to provide you with the care you need, when you need it.     Your next appointment:   6 month(s)  The format for your next appointment:   In Person  Provider:   Bryan Lemma, MD  or Micah Flesher, PA-C, Marjie Skiff,  PA-C, Juanda Crumble, PA-C, Bernadene Person, NP, or Carlos Levering, NP         Marykay Lex, MD, MS Robin [name], M.D., M.S. Interventional Cardiologist  [facility]  Pager # [phone] Phone # [phone] [address] [place], [place] [address]   Thank you for choosing [facility] at [facility]!!

## 2023-02-20 NOTE — Patient Instructions (Signed)
Medication Instructions:    Albuterol inhaler refilled - primary to refill in the future  *If you need a refill on your cardiac medications before your next appointment, please call your pharmacy*   Lab Work: Not needed    Testing/Procedures: Not needed   Follow-Up: At [facility], you and your health needs are our priority.  As part of our continuing mission to provide you with exceptional heart care, we have created designated Provider Care Teams.  These Care Teams include your primary Cardiologist (physician) and Advanced Practice Providers (APPs -  Physician Assistants and Nurse Practitioners) who all work together to provide you with the care you need, when you need it.     Your next appointment:   6 month(s)  The format for your next appointment:   In Person  Provider:   Bryan Lemma, MD  or Micah Flesher, PA-C, Marjie Skiff, PA-C, Juanda Crumble, PA-C, Bernadene Person, NP, or Carlos Levering, NP

## 2023-02-24 ENCOUNTER — Encounter: Payer: Self-pay | Admitting: Cardiology

## 2023-02-24 NOTE — Assessment & Plan Note (Signed)
She stopped taking her Breo, I recommended that she goes back on Breo--or discuss with PCP.  Will provide prescription for albuterol.

## 2023-02-24 NOTE — Assessment & Plan Note (Signed)
Did not tolerate beta-blocker because of fatigue and wheezing.  Pretty well-controlled at this point.  If they recur, would switch from amlodipine to diltiazem.

## 2023-02-24 NOTE — Assessment & Plan Note (Addendum)
Moderate to severe AI/AS on echo.  Decided to forego further evaluation with plans for possible TAVR canceled. Continue to monitor medically.  Treat blood pressure for afterload reduction.  Discussed concerning symptoms.  I think her dyspnea is probably more related to COPD as opposed to CHF from valvular disease. No PND, orthopnea or edema.  Plan: Continue amlodipine 2.5 mg daily along with valsartan HCTZ.  Blood pressure will stabilize. Not on beta-blocker because of her COPD, but we could consider switching from Norvasc to diltiazem yesterday. Avoid dehydration.

## 2023-02-24 NOTE — Assessment & Plan Note (Addendum)
Stable BP on current dose of amlodipine and high-dose valsartan and HCTZ.  Low threshold to switch from amlodipine to diltiazem.

## 2023-02-24 NOTE — Assessment & Plan Note (Signed)
Continue to work on afterload reduction.  Similarly not interested in further titration.  Continue supportive care.

## 2023-02-24 NOTE — Assessment & Plan Note (Signed)
Lipids look pretty well-controlled on lovastatin.  Tolerating well.  Labs followed by PCP.

## 2023-03-09 ENCOUNTER — Other Ambulatory Visit: Payer: Self-pay | Admitting: Internal Medicine

## 2023-04-21 ENCOUNTER — Other Ambulatory Visit: Payer: Self-pay | Admitting: Internal Medicine

## 2023-05-06 ENCOUNTER — Other Ambulatory Visit: Payer: Self-pay | Admitting: Internal Medicine

## 2023-05-11 ENCOUNTER — Telehealth: Payer: Self-pay | Admitting: Internal Medicine

## 2023-05-11 NOTE — Telephone Encounter (Signed)
Patient has not been seen since 08/2022 she has requested that lab work should be done. Please put orders in. She has an appointment with you on 05/23/23 in morning.Thank you

## 2023-05-14 ENCOUNTER — Other Ambulatory Visit: Payer: Medicare PPO

## 2023-05-15 ENCOUNTER — Other Ambulatory Visit: Payer: Self-pay | Admitting: Internal Medicine

## 2023-05-15 DIAGNOSIS — I351 Nonrheumatic aortic (valve) insufficiency: Secondary | ICD-10-CM

## 2023-05-15 DIAGNOSIS — I10 Essential (primary) hypertension: Secondary | ICD-10-CM

## 2023-05-15 DIAGNOSIS — J410 Simple chronic bronchitis: Secondary | ICD-10-CM

## 2023-05-15 NOTE — Telephone Encounter (Signed)
Completed.

## 2023-05-17 ENCOUNTER — Other Ambulatory Visit: Payer: Medicare PPO

## 2023-05-17 DIAGNOSIS — I351 Nonrheumatic aortic (valve) insufficiency: Secondary | ICD-10-CM | POA: Diagnosis not present

## 2023-05-17 DIAGNOSIS — I10 Essential (primary) hypertension: Secondary | ICD-10-CM | POA: Diagnosis not present

## 2023-05-18 LAB — CBC WITH DIFFERENTIAL/PLATELET
Absolute Monocytes: 462 cells/uL (ref 200–950)
Basophils Absolute: 28 cells/uL (ref 0–200)
Basophils Relative: 0.5 %
Eosinophils Absolute: 121 cells/uL (ref 15–500)
Eosinophils Relative: 2.2 %
HCT: 35.7 % (ref 35.0–45.0)
Hemoglobin: 11.8 g/dL (ref 11.7–15.5)
Lymphs Abs: 1414 cells/uL (ref 850–3900)
MCH: 30 pg (ref 27.0–33.0)
MCHC: 33.1 g/dL (ref 32.0–36.0)
MCV: 90.8 fL (ref 80.0–100.0)
MPV: 9.6 fL (ref 7.5–12.5)
Monocytes Relative: 8.4 %
Neutro Abs: 3476 cells/uL (ref 1500–7800)
Neutrophils Relative %: 63.2 %
Platelets: 213 10*3/uL (ref 140–400)
RBC: 3.93 10*6/uL (ref 3.80–5.10)
RDW: 12.9 % (ref 11.0–15.0)
Total Lymphocyte: 25.7 %
WBC: 5.5 10*3/uL (ref 3.8–10.8)

## 2023-05-18 LAB — COMPLETE METABOLIC PANEL WITH GFR
AG Ratio: 1.6 (calc) (ref 1.0–2.5)
ALT: 9 U/L (ref 6–29)
AST: 17 U/L (ref 10–35)
Albumin: 4.2 g/dL (ref 3.6–5.1)
Alkaline phosphatase (APISO): 69 U/L (ref 37–153)
BUN/Creatinine Ratio: 29 (calc) — ABNORMAL HIGH (ref 6–22)
BUN: 29 mg/dL — ABNORMAL HIGH (ref 7–25)
CO2: 27 mmol/L (ref 20–32)
Calcium: 9.4 mg/dL (ref 8.6–10.4)
Chloride: 98 mmol/L (ref 98–110)
Creat: 1 mg/dL — ABNORMAL HIGH (ref 0.60–0.95)
Globulin: 2.6 g/dL (calc) (ref 1.9–3.7)
Glucose, Bld: 86 mg/dL (ref 65–99)
Potassium: 4.3 mmol/L (ref 3.5–5.3)
Sodium: 135 mmol/L (ref 135–146)
Total Bilirubin: 0.5 mg/dL (ref 0.2–1.2)
Total Protein: 6.8 g/dL (ref 6.1–8.1)
eGFR: 54 mL/min/{1.73_m2} — ABNORMAL LOW (ref >=60)

## 2023-05-18 LAB — LIPID PANEL
Cholesterol: 177 mg/dL (ref <200)
HDL: 73 mg/dL (ref >=50)
LDL Cholesterol (Calc): 87 mg/dL (calc)
Non-HDL Cholesterol (Calc): 104 mg/dL (calc) (ref <130)
Total CHOL/HDL Ratio: 2.4 (calc) (ref <5.0)
Triglycerides: 77 mg/dL (ref <150)

## 2023-05-18 LAB — TSH: TSH: 1.5 mIU/L (ref 0.40–4.50)

## 2023-05-23 ENCOUNTER — Encounter: Payer: Self-pay | Admitting: Internal Medicine

## 2023-05-23 ENCOUNTER — Non-Acute Institutional Stay: Payer: Medicare PPO | Admitting: Internal Medicine

## 2023-05-23 VITALS — BP 126/72 | HR 84 | Temp 97.6°F | Resp 17 | Ht 60.0 in | Wt 126.2 lb

## 2023-05-23 DIAGNOSIS — M810 Age-related osteoporosis without current pathological fracture: Secondary | ICD-10-CM

## 2023-05-23 DIAGNOSIS — I10 Essential (primary) hypertension: Secondary | ICD-10-CM | POA: Diagnosis not present

## 2023-05-23 DIAGNOSIS — J410 Simple chronic bronchitis: Secondary | ICD-10-CM | POA: Diagnosis not present

## 2023-05-23 DIAGNOSIS — M545 Low back pain, unspecified: Secondary | ICD-10-CM | POA: Diagnosis not present

## 2023-05-23 DIAGNOSIS — E871 Hypo-osmolality and hyponatremia: Secondary | ICD-10-CM

## 2023-05-23 DIAGNOSIS — I351 Nonrheumatic aortic (valve) insufficiency: Secondary | ICD-10-CM | POA: Diagnosis not present

## 2023-05-23 DIAGNOSIS — K5901 Slow transit constipation: Secondary | ICD-10-CM

## 2023-05-23 DIAGNOSIS — F339 Major depressive disorder, recurrent, unspecified: Secondary | ICD-10-CM | POA: Diagnosis not present

## 2023-05-23 DIAGNOSIS — G8929 Other chronic pain: Secondary | ICD-10-CM

## 2023-05-23 DIAGNOSIS — E7849 Other hyperlipidemia: Secondary | ICD-10-CM | POA: Diagnosis not present

## 2023-05-23 MED ORDER — METHOCARBAMOL 500 MG PO TABS: 250.0000 mg | ORAL_TABLET | Freq: Every day | ORAL | 0 refills | Status: DC | PRN

## 2023-05-23 NOTE — Patient Instructions (Addendum)
Take Calcium 600 mg and Vit D 800 IU every day Senna Plus at night to help with Constipation Can use Salon Pas and Voltaren Gel as needed for back pain Take Robaxin as needed for Muscle relaxant

## 2023-05-23 NOTE — Progress Notes (Signed)
[facility]:  [facility] Service:  Clinic (12)  Provider:   Code Status: DNR Goals of Care:     05/23/2023   11:15 AM  Advanced Directives  Does Patient Have a Medical Advance Directive? Yes  Type of Estate agent of [place];Living will;Out of facility DNR (pink MOST or yellow form)  Does patient want to make changes to medical advance directive? No - Patient declined  Copy of Healthcare Power of Attorney in Chart? No - copy requested     Chief Complaint  Patient presents with   Immunizations    Patient is due for tdap, covid and flu vaccine   Care Management    HPI: Patient is a 89 y.o. female seen today for medical management of chronic diseases.   [place]ives [place] in [facility]  Has h/o Severe AS with AR Has decided not to undergo TAVR. Continues to be stable  Seen by cardiologist No SOB    Depression and anxiety Stable Doing well   Chronic back pain Has seen neurosurgery before Today wanted something stronger for her pain Does not want Gabapentin or Tramadol  Alternating diarrhea and constipation questionable IBS.  Has seen GI. COPD HLD, hypertension   Continues to stay stable walks with her walker Past Medical History:  Diagnosis Date   Arthritis    COPD (chronic obstructive pulmonary disease) (HCC)    Depression    Diverticulosis    GERD (gastroesophageal reflux disease)    Hyperlipemia    Hypertension    Irregular heart beat 2014   PVCs   Moderate to severe aortic insufficiency 07/2019   Echo (03/08/2020): Aortic stenosis is now severe with mean gradient 60 mmHg. Aortic regurgitation is moderate to severe.   Osteoporosis    Severe aortic stenosis 06/11/2019   Echo 06/2019: EF 60-65%. Mild basal septal LVH, GR 2 DD. Mod LA dilation.- High LVEDP. Severe AI, Mod AS-peak gradient 43 mmHg, mean 28 mmHg;; 02/2020: Mean AoV Gradient 60 mmHg with Mod AI. EF 70-755.  Mod LVH. Mod LA dilation. Mod MR.    Past Surgical History:   Procedure Laterality Date   ABDOMINAL HYSTERECTOMY     both appy and hyst done together   APPENDECTOMY     CATARACT EXTRACTION     TRANSTHORACIC ECHOCARDIOGRAM  09/09/2021   : EF 60 to 65%.  Normal LV size and function.  Mild LVH.  Indeterminate D Fxn-elevated LAP with Mod LA dilation.  Severe MAC with moderate MR/MS (mean gradient 9.1 mmHg).  Moderate AoV calcification with severe thickening-> Mod to Severe AS/AI; no change.   TRANSTHORACIC ECHOCARDIOGRAM  09/06/2020   EF 60-65%.  Normal LV fxn.  Mod Conc LVH of basal septal segment.  Indeterminate filling pattern (likely Gr2DD w/ Mod LA dilation).  Nl RV size and function.  Mildly increased PAP ~ 39.5 mmHg.  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  Mild-Mod TR.  Severe AoV Ca++ -> Mod-Severe AI & AS (mean gradient 33 mmHg) - ? lower b.c lower LVEF?    Allergies  Allergen Reactions   Fosamax [Alendronate Sodium]     Poor healing to mouth wound   Augmentin [Amoxicillin-Pot Clavulanate] Nausea Only    DID THE REACTION INVOLVE: Swelling of the face/tongue/throat, SOB, or low BP? No Sudden or severe rash/hives, skin peeling, or the inside of the mouth or nose? No Did it require medical treatment? No When did it  last happen?       If all above answers are "NO", may proceed with cephalosporin use.    Hydrocodone Nausea And Vomiting   Mobic [Meloxicam] Rash   Sulfonamide Derivatives Rash    Outpatient Encounter Medications as of 05/23/2023  Medication Sig   acetaminophen (TYLENOL) 650 MG CR tablet Take 650 mg by mouth in the morning, at noon, and at bedtime.    albuterol (VENTOLIN HFA) 108 (90 Base) MCG/ACT inhaler Inhale 2 puffs into the lungs every 6 (six) hours as needed for wheezing or shortness of breath.   amLODipine (NORVASC) 2.5 MG tablet TAKE 1 TABLET BY MOUTH EVERY DAY   aspirin 81 MG tablet Take 81 mg by mouth daily.   lovastatin (MEVACOR) 40 MG tablet TAKE 1 TABLET BY MOUTH EVERY DAY   MELATONIN  PO Take 2 mg by mouth at bedtime as needed.   methocarbamol (ROBAXIN) 500 MG tablet Take 0.5 tablets (250 mg total) by mouth daily as needed for muscle spasms.   pantoprazole (PROTONIX) 20 MG tablet TAKE 1 TABLET BY MOUTH EVERY DAY AS NEEDED   Probiotic Product (PROBIOTIC DAILY PO) Take 1 capsule by mouth daily.   sertraline (ZOLOFT) 100 MG tablet TAKE 1 TABLET BY MOUTH EVERY DAY   SPIRIVA HANDIHALER 18 MCG inhalation capsule INHALE 1 CAPSULE USING THE HANDIHALER DEVICE ONCE A DAY   valsartan-hydrochlorothiazide (DIOVAN-HCT) 320-12.5 MG tablet TAKE 1 TABLET BY MOUTH EVERY DAY   No facility-administered encounter medications on file as of 05/23/2023.    Review of Systems:  Review of Systems  Constitutional:  Negative for activity change and appetite change.  HENT: Negative.    Respiratory:  Negative for cough and shortness of breath.   Cardiovascular:  Negative for leg swelling.  Gastrointestinal:  Negative for constipation.  Genitourinary: Negative.   Musculoskeletal:  Positive for back pain and gait problem. Negative for arthralgias and myalgias.  Skin: Negative.   Neurological:  Negative for dizziness and weakness.  Psychiatric/Behavioral:  Negative for confusion, dysphoric mood and sleep disturbance.     Health Maintenance  Topic Date Due   DTaP/Tdap/Td (2 - Td or Tdap) 11/06/2022   INFLUENZA VACCINE  03/29/2023   COVID-19 Vaccine (6 - 2023-24 season) 04/29/2023   Medicare Annual Wellness (AWV)  01/08/2024   Pneumonia Vaccine 10+ Years old  Completed   DEXA SCAN  Completed   HPV VACCINES  Aged Out   Zoster Vaccines- Shingrix  Discontinued    Physical Exam: Vitals:   05/23/23 1112  BP: 126/72  Pulse: 84  Resp: 17  Temp: 97.6 F (36.4 C)  TempSrc: Temporal  SpO2: 94%  Weight: 126 lb 3.2 oz (57.2 kg)  Height: 5' (1.524 m)   Body mass index is 24.65 kg/m. Physical Exam Vitals reviewed.  Constitutional:      Appearance: Normal appearance.  HENT:     Head:  Normocephalic.     Nose: Nose normal.     Mouth/Throat:     Mouth: Mucous membranes are moist.     Pharynx: Oropharynx is clear.  Eyes:     Pupils: Pupils are equal, round, and reactive to light.  Cardiovascular:     Rate and Rhythm: Normal rate and regular rhythm.     Pulses: Normal pulses.     Heart sounds: Normal heart sounds. No murmur heard. Pulmonary:     Effort: Pulmonary effort is normal.     Breath sounds: Normal breath sounds.  Abdominal:     General: Abdomen  is flat. Bowel sounds are normal.     Palpations: Abdomen is soft.  Musculoskeletal:        General: No swelling.     Cervical back: Neck supple.  Skin:    General: Skin is warm.  Neurological:     General: No focal deficit present.     Mental Status: She is alert and oriented to person, place, and time.  Psychiatric:        Mood and Affect: Mood normal.        Thought Content: Thought content normal.     Labs reviewed: Basic Metabolic Panel: Recent Labs    09/21/22 0820 05/17/23 0820  NA 138 135  K 4.0 4.3  CL 102 98  CO2 27 27  GLUCOSE 89 86  BUN 37* 29*  CREATININE 0.96* 1.00*  CALCIUM 9.4 9.4  TSH  --  1.50   Liver Function Tests: Recent Labs    09/21/22 0820 05/17/23 0820  AST 14 17  ALT 9 9  BILITOT 0.5 0.5  PROT 6.7 6.8   No results for input(s): "LIPASE", "AMYLASE" in the last 8760 hours. No results for input(s): "AMMONIA" in the last 8760 hours. CBC: Recent Labs    09/21/22 0820 05/17/23 0820  WBC 5.4 5.5  NEUTROABS 3,272 3,476  HGB 12.3 11.8  HCT 36.0 35.7  MCV 91.4 90.8  PLT 203 213   Lipid Panel: Recent Labs    09/21/22 0820 05/17/23 0820  CHOL 158 177  HDL 61 73  LDLCALC 80 87  TRIG 90 77  CHOLHDL 2.6 2.4   No results found for: "HGBA1C"  Procedures since last visit: No results found.  Assessment/Plan 1. Severe aortic insufficiency and AS Decided against AVR Staying stable Per cardiology After load reduction 2. Essential hypertension Diovan and  Norvasc  3. Simple chronic bronchitis (HCC) Sometimes has to use Proair otherwise stable on Spiriva  4. Chronic bilateral low back pain without sciatica Add Robaxin PRN to tylenol Can use Salon pas /Voltaren  5. Depression, recurrent (HCC) Stable on Zoloft  6. Osteoporosis, unspecified osteoporosis type, unspecified pathological fracture presence Received Reclast for 8 years Does not want Prolia right now DEXA in 12/22 Femur -2.0 Radius -3.7  7. Hyponatremia stable  8. Other hyperlipidemia Statin Wants to stop it eventually  9. Slow transit constipation Can use senna as needed    Labs/tests ordered:   Next appt:  Visit date not found

## 2023-06-07 ENCOUNTER — Other Ambulatory Visit: Payer: Self-pay | Admitting: Internal Medicine

## 2023-06-20 ENCOUNTER — Telehealth: Payer: Medicare PPO

## 2023-06-20 NOTE — Telephone Encounter (Signed)
Patient states she need a refill on Spiriva. Patient states she does not remember getting a prescription in June and needs it for her COPD.

## 2023-06-21 ENCOUNTER — Other Ambulatory Visit: Payer: Self-pay | Admitting: Internal Medicine

## 2023-06-21 MED ORDER — TIOTROPIUM BROMIDE MONOHYDRATE 18 MCG IN CAPS: 18.0000 ug | ORAL_CAPSULE | Freq: Every day | RESPIRATORY_TRACT | 3 refills | Status: DC

## 2023-06-21 NOTE — Telephone Encounter (Signed)
Done

## 2023-09-02 ENCOUNTER — Other Ambulatory Visit: Payer: Self-pay | Admitting: Internal Medicine

## 2023-09-05 ENCOUNTER — Other Ambulatory Visit: Payer: Self-pay | Admitting: Internal Medicine

## 2023-09-21 ENCOUNTER — Ambulatory Visit: Payer: Medicare PPO | Admitting: Cardiology

## 2023-09-26 ENCOUNTER — Non-Acute Institutional Stay: Payer: Medicare PPO | Admitting: Internal Medicine

## 2023-09-26 ENCOUNTER — Encounter: Payer: Self-pay | Admitting: Internal Medicine

## 2023-09-26 VITALS — BP 110/60 | HR 88 | Temp 98.7°F | Resp 18 | Ht 60.0 in | Wt 122.6 lb

## 2023-09-26 DIAGNOSIS — R531 Weakness: Secondary | ICD-10-CM

## 2023-09-26 DIAGNOSIS — R112 Nausea with vomiting, unspecified: Secondary | ICD-10-CM

## 2023-09-26 NOTE — Patient Instructions (Addendum)
Take Pantoprazole Every day Encourage PO Fluids Discontinue Norvasc

## 2023-09-26 NOTE — Progress Notes (Unsigned)
[facility]  Clinic (12)  Provider:   Code Status: DNR Goals of Care:     09/26/2023    2:02 PM  Advanced Directives  Does Patient Have a Medical Advance Directive? Yes  Type of Estate agent of [place];Living will;Out of facility DNR (pink MOST or yellow form)     Chief Complaint  Patient presents with   Acute Visit    Feeling weak.    HPI: Patient is a 89 y.o. female seen today for an acute visit for Feeling Weak  Discussed the use of AI scribe software for clinical note transcription with the patient, who gave verbal consent to proceed.  History of Present Illness   The patient presents with gastrointestinal symptoms including nausea, vomiting, and black stools.  She  have been experiencing these symptoms for approximately a week, after eating [employer] and then having  stomach  virus' last Thursday. The vomiting was persistent, yellow, and mostly bile.  They attempted to manage nausea with Peptobismal medication. Stool were black due to that  Currently, stools have returned to normal.  She feels extremely weak, describing themselves as 'weak as this wall,' and have a significant decrease in appetite, with occasional slight nausea after eating.  Experience a gnawing pain in the stomach area and infrequent bowel movements, with only a small movement yesterday. They are able to keep down food but have a poor appetite. mit. They have been drinking Gatorade and ginger ale but dislike Gatorade. She recall a recent episode on Saturday where they felt extremely unwell and used their alert button, resulting in EMS being called. She considered going to the emergency room but decided against it after her vitals were checked. No current nausea like before, but slight nausea with eating.  No dizziness upon standing but feel weak. No pain when the stomach is palpated.      Other history Has h/o Severe AS with AR  Depression  and anxiety Chronic back pain  Alternating diarrhea and constipation questionable IBS.  Has seen GI. COPD HLD, hypertension Past Medical History:  Diagnosis Date   Arthritis    COPD (chronic obstructive pulmonary disease) (HCC)    Depression    Diverticulosis    GERD (gastroesophageal reflux disease)    Hyperlipemia    Hypertension    Irregular heart beat 2014   PVCs   Moderate to severe aortic insufficiency 07/2019   Echo (03/08/2020): Aortic stenosis is now severe with mean gradient 60 mmHg. Aortic regurgitation is moderate to severe.   Osteoporosis    Severe aortic stenosis 06/11/2019   Echo 06/2019: EF 60-65%. Mild basal septal LVH, GR 2 DD. Mod LA dilation.- High LVEDP. Severe AI, Mod AS-peak gradient 43 mmHg, mean 28 mmHg;; 02/2020: Mean AoV Gradient 60 mmHg with Mod AI. EF 70-755.  Mod LVH. Mod LA dilation. Mod MR.    Past Surgical History:  Procedure Laterality Date   ABDOMINAL HYSTERECTOMY     both appy and hyst done together   APPENDECTOMY     CATARACT EXTRACTION     TRANSTHORACIC ECHOCARDIOGRAM  09/09/2021   : EF 60 to 65%.  Normal LV size and function.  Mild LVH.  Indeterminate D Fxn-elevated LAP with Mod LA dilation.  Severe MAC with moderate MR/MS (mean gradient 9.1 mmHg).  Moderate AoV calcification with severe thickening-> Mod to Severe AS/AI; no change.   TRANSTHORACIC ECHOCARDIOGRAM  09/06/2020   EF 60-65%.  Normal LV  fxn.  Mod Conc LVH of basal septal segment.  Indeterminate filling pattern (likely Gr2DD w/ Mod LA dilation).  Nl RV size and function.  Mildly increased PAP ~ 39.5 mmHg.  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  Mild-Mod TR.  Severe AoV Ca++ -> Mod-Severe AI & AS (mean gradient 33 mmHg) - ? lower b.c lower LVEF?    Allergies  Allergen Reactions   Fosamax [Alendronate Sodium]     Poor healing to mouth wound   Augmentin [Amoxicillin-Pot Clavulanate] Nausea Only    DID THE REACTION INVOLVE: Swelling of the  face/tongue/throat, SOB, or low BP? No Sudden or severe rash/hives, skin peeling, or the inside of the mouth or nose? No Did it require medical treatment? No When did it last happen?       If all above answers are "NO", may proceed with cephalosporin use.    Hydrocodone Nausea And Vomiting   Mobic [Meloxicam] Rash   Sulfonamide Derivatives Rash    Outpatient Encounter Medications as of 09/26/2023  Medication Sig   acetaminophen (TYLENOL) 650 MG CR tablet Take 650 mg by mouth in the morning, at noon, and at bedtime.    albuterol (VENTOLIN HFA) 108 (90 Base) MCG/ACT inhaler Inhale 2 puffs into the lungs every 6 (six) hours as needed for wheezing or shortness of breath.   amLODipine (NORVASC) 2.5 MG tablet TAKE 1 TABLET BY MOUTH EVERY DAY   aspirin 81 MG tablet Take 81 mg by mouth daily.   lovastatin (MEVACOR) 40 MG tablet TAKE 1 TABLET BY MOUTH EVERY DAY   MELATONIN PO Take 2 mg by mouth at bedtime as needed.   methocarbamol (ROBAXIN) 500 MG tablet Take 0.5 tablets (250 mg total) by mouth daily as needed for muscle spasms.   pantoprazole (PROTONIX) 20 MG tablet TAKE 1 TABLET BY MOUTH EVERY DAY AS NEEDED   Probiotic Product (PROBIOTIC DAILY PO) Take 1 capsule by mouth daily.   sertraline (ZOLOFT) 100 MG tablet TAKE 1 TABLET BY MOUTH EVERY DAY   tiotropium (SPIRIVA HANDIHALER) 18 MCG inhalation capsule Place 1 capsule (18 mcg total) into inhaler and inhale daily.   valsartan-hydrochlorothiazide (DIOVAN-HCT) 320-12.5 MG tablet TAKE 1 TABLET BY MOUTH EVERY DAY   No facility-administered encounter medications on file as of 09/26/2023.    Review of Systems:  Review of Systems  Constitutional:  Positive for activity change and appetite change.  HENT: Negative.    Respiratory:  Negative for cough and shortness of breath.   Cardiovascular:  Negative for leg swelling.  Gastrointestinal:  Positive for nausea. Negative for abdominal pain, constipation and diarrhea.  Genitourinary: Negative.    Musculoskeletal:  Positive for gait problem. Negative for arthralgias and myalgias.  Skin: Negative.   Neurological:  Positive for weakness. Negative for dizziness.  Psychiatric/Behavioral:  Negative for confusion, dysphoric mood and sleep disturbance.     Health Maintenance  Topic Date Due   DTaP/Tdap/Td (2 - Td or Tdap) 11/06/2022   INFLUENZA VACCINE  03/29/2023   COVID-19 Vaccine (6 - 2024-25 season) 04/29/2023   Medicare Annual Wellness (AWV)  01/08/2024   Pneumonia Vaccine 71+ Years old  Completed   DEXA SCAN  Completed   HPV VACCINES  Aged Out   Zoster Vaccines- Shingrix  Discontinued    Physical Exam: Vitals:   09/26/23 1358  BP: 110/60  Pulse: 88  Resp: 18  Temp: 98.7 F (37.1 C)  SpO2: 93%  Weight: 122 lb 9.6  oz (55.6 kg)  Height: 5' (1.524 m)   Body mass index is 23.94 kg/m. Physical Exam Vitals reviewed.  Constitutional:      Appearance: Normal appearance.  HENT:     Head: Normocephalic.     Nose: Nose normal.     Mouth/Throat:     Mouth: Mucous membranes are moist.     Pharynx: Oropharynx is clear.  Eyes:     Pupils: Pupils are equal, round, and reactive to light.  Cardiovascular:     Rate and Rhythm: Normal rate and regular rhythm.     Pulses: Normal pulses.     Heart sounds: Normal heart sounds. No murmur heard. Pulmonary:     Effort: Pulmonary effort is normal.     Breath sounds: Normal breath sounds.  Abdominal:     General: Abdomen is flat. Bowel sounds are normal. There is no distension.     Palpations: Abdomen is soft.     Tenderness: There is no abdominal tenderness. There is no guarding.  Musculoskeletal:        General: No swelling.     Cervical back: Neck supple.  Skin:    General: Skin is warm.  Neurological:     General: No focal deficit present.     Mental Status: She is alert and oriented to person, place, and time.  Psychiatric:        Mood and Affect: Mood normal.        Thought Content: Thought content normal.      Labs reviewed: Basic Metabolic Panel: Recent Labs    05/17/23 0820  NA 135  K 4.3  CL 98  CO2 27  GLUCOSE 86  BUN 29*  CREATININE 1.00*  CALCIUM 9.4  TSH 1.50   Liver Function Tests: Recent Labs    05/17/23 0820  AST 17  ALT 9  BILITOT 0.5  PROT 6.8   No results for input(s): "LIPASE", "AMYLASE" in the last 8760 hours. No results for input(s): "AMMONIA" in the last 8760 hours. CBC: Recent Labs    05/17/23 0820  WBC 5.5  NEUTROABS 3,476  HGB 11.8  HCT 35.7  MCV 90.8  PLT 213   Lipid Panel: Recent Labs    05/17/23 0820  CHOL 177  HDL 73  LDLCALC 87  TRIG 77  CHOLHDL 2.4   No results found for: "HGBA1C"  Procedures since last visit: No results found.  Assessment/Plan Assessment and Plan    Gastroenteritis Recent history of vomiting and black stools, now resolved. Current symptoms include weakness, decreased appetite, and a gnawing feeling in the stomach. No current nausea or vomiting. Stools are now normal in color. -Order blood work to check hemoglobin, kidney function, liver function, and pancreatic function. -Continue Pantoprazole daily,  Hypotension Blood pressure is low, possibly due to recent illness and decreased oral intake. -Discontinue Amlodipine temporarily. -Monitor blood pressure at home and restart Amlodipine if systolic blood pressure exceeds 140-150.  Dehydration Patient reports feeling very weak and has a dry mouth. She has been drinking fluids but may not be drinking enough due to decreased appetite and nausea. -Encourage increased fluid intake, including Gatorade for electrolyte replacement. -Check sodium levels in blood work.  Follow-up in two weeks to reassess condition. If patient's condition worsens, she should seek immediate medical attention.         Labs/tests ordered:  * No order type specified * Next appt:  11/15/2023

## 2023-09-27 ENCOUNTER — Other Ambulatory Visit: Payer: Medicare PPO

## 2023-09-27 DIAGNOSIS — R112 Nausea with vomiting, unspecified: Secondary | ICD-10-CM | POA: Diagnosis not present

## 2023-09-27 DIAGNOSIS — R531 Weakness: Secondary | ICD-10-CM | POA: Diagnosis not present

## 2023-09-27 LAB — CBC WITH DIFFERENTIAL/PLATELET
Absolute Lymphocytes: 1160 {cells}/uL (ref 850–3900)
Absolute Monocytes: 316 {cells}/uL (ref 200–950)
Basophils Absolute: 28 {cells}/uL (ref 0–200)
Basophils Relative: 0.7 %
Eosinophils Absolute: 60 {cells}/uL (ref 15–500)
Eosinophils Relative: 1.5 %
HCT: 34.9 % — ABNORMAL LOW (ref 35.0–45.0)
Hemoglobin: 11.9 g/dL (ref 11.7–15.5)
MCH: 30.2 pg (ref 27.0–33.0)
MCHC: 34.1 g/dL (ref 32.0–36.0)
MCV: 88.6 fL (ref 80.0–100.0)
MPV: 10.1 fL (ref 7.5–12.5)
Monocytes Relative: 7.9 %
Neutro Abs: 2436 {cells}/uL (ref 1500–7800)
Neutrophils Relative %: 60.9 %
Platelets: 193 10*3/uL (ref 140–400)
RBC: 3.94 10*6/uL (ref 3.80–5.10)
RDW: 12.8 % (ref 11.0–15.0)
Total Lymphocyte: 29 %
WBC: 4 10*3/uL (ref 3.8–10.8)

## 2023-09-27 LAB — COMPLETE METABOLIC PANEL WITH GFR
AG Ratio: 1.8 (calc) (ref 1.0–2.5)
ALT: 13 U/L (ref 6–29)
AST: 18 U/L (ref 10–35)
Albumin: 4 g/dL (ref 3.6–5.1)
Alkaline phosphatase (APISO): 56 U/L (ref 37–153)
BUN: 19 mg/dL (ref 7–25)
CO2: 30 mmol/L (ref 20–32)
Calcium: 8.7 mg/dL (ref 8.6–10.4)
Chloride: 98 mmol/L (ref 98–110)
Creat: 0.87 mg/dL (ref 0.60–0.95)
Globulin: 2.2 g/dL (ref 1.9–3.7)
Glucose, Bld: 99 mg/dL (ref 65–99)
Potassium: 3 mmol/L — ABNORMAL LOW (ref 3.5–5.3)
Sodium: 137 mmol/L (ref 135–146)
Total Bilirubin: 0.7 mg/dL (ref 0.2–1.2)
Total Protein: 6.2 g/dL (ref 6.1–8.1)
eGFR: 64 mL/min/{1.73_m2} (ref >=60)

## 2023-09-27 LAB — LIPASE: Lipase: 20 U/L (ref 7–60)

## 2023-09-28 ENCOUNTER — Other Ambulatory Visit: Payer: Self-pay | Admitting: Internal Medicine

## 2023-09-28 MED ORDER — POTASSIUM CHLORIDE CRYS ER 20 MEQ PO TBCR: 20.0000 meq | EXTENDED_RELEASE_TABLET | Freq: Every day | ORAL | 1 refills | Status: DC

## 2023-10-10 ENCOUNTER — Non-Acute Institutional Stay: Payer: Medicare PPO | Admitting: Internal Medicine

## 2023-10-10 ENCOUNTER — Encounter: Payer: Self-pay | Admitting: Internal Medicine

## 2023-10-10 VITALS — BP 120/70 | HR 90 | Temp 98.2°F | Resp 17 | Ht 60.0 in | Wt 127.0 lb

## 2023-10-10 DIAGNOSIS — I351 Nonrheumatic aortic (valve) insufficiency: Secondary | ICD-10-CM | POA: Diagnosis not present

## 2023-10-10 DIAGNOSIS — R531 Weakness: Secondary | ICD-10-CM | POA: Diagnosis not present

## 2023-10-10 MED ORDER — METHOCARBAMOL 500 MG PO TABS: 250.0000 mg | ORAL_TABLET | Freq: Every day | ORAL | 0 refills | Status: DC | PRN

## 2023-10-11 NOTE — Progress Notes (Signed)
Location:  [facility] Service:  Clinic ([id number])  Provider:   Code Status:  Goals of Care:     10/10/2023    2:14 PM  Advanced Directives  Does Patient Have a Medical Advance Directive? Yes  Type of Estate agent of [place];Living will;Out of facility DNR (pink MOST or yellow form)  Does patient want to make changes to medical advance directive? No - Patient declined     Chief Complaint  Patient presents with   Medical Management of Chronic Issues    Patient is being seen for a 1 month follow up   Immunizations    Patient is due for tdap and covid vaccine    HPI: Patient is a 89 y.o. female seen today for medical management of chronic diseases.   Follow up for her Gastroenteritis visit last week         Discussed the use of AI scribe software for clinical note transcription with the patient, who gave verbal consent to proceed.  History of Present Illness   Robin [name] is an 89 year old female who presents with weakness Following her  gastrointestinal symptoms. Her Nausea and Vomiting is resolved Her labs done were all WNL. Potassium was low and She got supplemented for that.  . Her appetite was initially poor, but she is now eating normally.  pantoprazole (Protonix), which she takes daily, sometimes twice a day, since her gastrointestinal issues began.  She reports a prolonged period of weakness and fatigue, with yesterday being the first day she felt more normal. Her weakness is improving, but she still experiences shortness of breath with extended walking.  She experiences chronic back pain, which has worsened since her recent illness and decreased activity. She has been prescribed Robaxin but has not yet taken it. She wants to try it to alleviate her back pain. She has resumed using an elliptical machine for exercise after a period of inactivity due to her illness.    Her Other history  Has h/o Severe AS with AR  Depression  and anxiety Chronic back pain  Alternating diarrhea and constipation questionable IBS.  Has seen GI. COPD HLD, hypertension Past Medical History:  Diagnosis Date   Arthritis    COPD (chronic obstructive pulmonary disease) (HCC)    Depression    Diverticulosis    GERD (gastroesophageal reflux disease)    Hyperlipemia    Hypertension    Irregular heart beat 2014   PVCs   Moderate to severe aortic insufficiency 07/2019   Echo (03/08/2020): Aortic stenosis is now severe with mean gradient 60 mmHg. Aortic regurgitation is moderate to severe.   Osteoporosis    Severe aortic stenosis 06/11/2019   Echo 06/2019: EF 60-65%. Mild basal septal LVH, GR 2 DD. Mod LA dilation.- High LVEDP. Severe AI, Mod AS-peak gradient 43 mmHg, mean 28 mmHg;; 02/2020: Mean AoV Gradient 60 mmHg with Mod AI. EF 70-755.  Mod LVH. Mod LA dilation. Mod MR.    Past Surgical History:  Procedure Laterality Date   ABDOMINAL HYSTERECTOMY     both appy and hyst done together   APPENDECTOMY     CATARACT EXTRACTION     TRANSTHORACIC ECHOCARDIOGRAM  09/09/2021   : EF 60 to 65%.  Normal LV size and function.  Mild LVH.  Indeterminate D Fxn-elevated LAP with Mod LA dilation.  Severe MAC with moderate MR/MS (mean gradient 9.1 mmHg).  Moderate AoV calcification with severe thickening-> Mod to Severe  AS/AI; no change.   TRANSTHORACIC ECHOCARDIOGRAM  09/06/2020   EF 60-65%.  Normal LV fxn.  Mod Conc LVH of basal septal segment.  Indeterminate filling pattern (likely Gr2DD w/ Mod LA dilation).  Nl RV size and function.  Mildly increased PAP ~ 39.5 mmHg.  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  Mild-Mod TR.  Severe AoV Ca++ -> Mod-Severe AI & AS (mean gradient 33 mmHg) - ? lower b.c lower LVEF?    Allergies  Allergen Reactions   Fosamax [Alendronate Sodium]     Poor healing to mouth wound   Augmentin [Amoxicillin-Pot Clavulanate] Nausea Only    DID THE REACTION INVOLVE: Swelling of the  face/tongue/throat, SOB, or low BP? No Sudden or severe rash/hives, skin peeling, or the inside of the mouth or nose? No Did it require medical treatment? No When did it last happen?       If all above answers are "NO", may proceed with cephalosporin use.    Hydrocodone Nausea And Vomiting   Mobic [Meloxicam] Rash   Sulfonamide Derivatives Rash    Outpatient Encounter Medications as of 10/10/2023  Medication Sig   acetaminophen (TYLENOL) 650 MG CR tablet Take 650 mg by mouth in the morning, at noon, and at bedtime.    albuterol (VENTOLIN HFA) 108 (90 Base) MCG/ACT inhaler Inhale 2 puffs into the lungs every 6 (six) hours as needed for wheezing or shortness of breath.   aspirin 81 MG tablet Take 81 mg by mouth daily.   lovastatin (MEVACOR) 40 MG tablet TAKE 1 TABLET BY MOUTH EVERY DAY   MELATONIN PO Take 2 mg by mouth at bedtime as needed.   methocarbamol (ROBAXIN) 500 MG tablet Take 0.5 tablets (250 mg total) by mouth daily as needed for muscle spasms.   pantoprazole (PROTONIX) 20 MG tablet TAKE 1 TABLET BY MOUTH EVERY DAY AS NEEDED (Patient taking differently: Take 20 mg by mouth 2 (two) times daily.)   Probiotic Product (PROBIOTIC DAILY PO) Take 1 capsule by mouth daily.   sertraline (ZOLOFT) 100 MG tablet TAKE 1 TABLET BY MOUTH EVERY DAY   tiotropium (SPIRIVA HANDIHALER) 18 MCG inhalation capsule Place 1 capsule (18 mcg total) into inhaler and inhale daily.   valsartan-hydrochlorothiazide (DIOVAN-HCT) 320-12.5 MG tablet TAKE 1 TABLET BY MOUTH EVERY DAY   [DISCONTINUED] methocarbamol (ROBAXIN) 500 MG tablet Take 0.5 tablets (250 mg total) by mouth daily as needed for muscle spasms.   [DISCONTINUED] potassium chloride SA (KLOR-CON M) 20 MEQ tablet Take 1 tablet (20 mEq total) by mouth daily for 7 days.   No facility-administered encounter medications on file as of 10/10/2023.    Review of Systems:  Review of Systems  Constitutional:  Positive for appetite change. Negative for activity  change.  HENT: Negative.    Respiratory:  Negative for cough and shortness of breath.   Cardiovascular:  Negative for leg swelling.  Gastrointestinal:  Negative for constipation.  Genitourinary: Negative.   Musculoskeletal:  Positive for back pain and gait problem. Negative for arthralgias and myalgias.  Skin: Negative.   Neurological:  Positive for weakness. Negative for dizziness.  Psychiatric/Behavioral:  Negative for confusion, dysphoric mood and sleep disturbance.     Health Maintenance  Topic Date Due   DTaP/Tdap/Td (2 - Td or Tdap) 11/06/2022   COVID-19 Vaccine (6 - 2024-25 season) 10/26/2023 (Originally 04/29/2023)   Medicare Annual Wellness (AWV)  01/08/2024   Pneumonia Vaccine 3+ Years old  Completed  INFLUENZA VACCINE  Completed   DEXA SCAN  Completed   HPV VACCINES  Aged Out   Zoster Vaccines- Shingrix  Discontinued    Physical Exam: Vitals:   10/10/23 1413  BP: 120/70  Pulse: 90  Resp: 17  Temp: 98.2 F (36.8 C)  TempSrc: Temporal  SpO2: 93%  Weight: 127 lb (57.6 kg)  Height: 5' (1.524 m)   Body mass index is 24.8 kg/m. Physical Exam Vitals reviewed.  Constitutional:      Appearance: Normal appearance.  HENT:     Head: Normocephalic.     Nose: Nose normal.     Mouth/Throat:     Mouth: Mucous membranes are moist.     Pharynx: Oropharynx is clear.  Eyes:     Pupils: Pupils are equal, round, and reactive to light.  Cardiovascular:     Rate and Rhythm: Normal rate and regular rhythm.     Pulses: Normal pulses.     Heart sounds: Murmur heard.  Pulmonary:     Effort: Pulmonary effort is normal.     Breath sounds: Normal breath sounds.  Abdominal:     General: Abdomen is flat. Bowel sounds are normal.     Palpations: Abdomen is soft.  Musculoskeletal:        General: No swelling.     Cervical back: Neck supple.  Skin:    General: Skin is warm.  Neurological:     General: No focal deficit present.     Mental Status: She is alert and oriented to  person, place, and time.  Psychiatric:        Mood and Affect: Mood normal.        Thought Content: Thought content normal.     Labs reviewed: Basic Metabolic Panel: Recent Labs    05/17/23 0820 09/27/23 0830  NA 135 137  K 4.3 3.0*  CL 98 98  CO2 27 30  GLUCOSE 86 99  BUN 29* 19  CREATININE 1.00* 0.87  CALCIUM 9.4 8.7  TSH 1.50  --    Liver Function Tests: Recent Labs    05/17/23 0820 09/27/23 0830  AST 17 18  ALT 9 13  BILITOT 0.5 0.7  PROT 6.8 6.2   Recent Labs    09/27/23 0830  LIPASE 20   No results for input(s): "AMMONIA" in the last 8760 hours. CBC: Recent Labs    05/17/23 0820 09/27/23 0830  WBC 5.5 4.0  NEUTROABS 3,476 2,436  HGB 11.8 11.9  HCT 35.7 34.9*  MCV 90.8 88.6  PLT 213 193   Lipid Panel: Recent Labs    05/17/23 0820  CHOL 177  HDL 73  LDLCALC 87  TRIG 77  CHOLHDL 2.4   No results found for: "HGBA1C"  Procedures since last visit: No results found.  Assessment/Plan Assessment and Plan    Gastroenteritis Recent episode of vomiting and diarrhea, now resolved. Patient is eating normally again. Stool color was noted to be gold, possibly due to diet or bile. Labs were largely normal except for low potassium, possibly due to illness or medication. -Continue Pantoprazole twice daily for another 3-4 weeks to aid in stomach healing. -Encouraged to maintain a diet rich in potassium.  Chronic Back Pain Patient reports ongoing back pain, which has been more bothersome recently. -Order Robaxin, instruct patient to start with half a tablet and increase to a full tablet if needed, especially at night due to its sedative effect.  Weakness PT/OT referral Made  Hypertension She had stopped  Norvasc due to weakness and Hypotension Will continue to monitor BP at home  General Health Maintenance -Complete blood work on 11/15/2023 to monitor potassium levels. -Follow-up appointment scheduled for 11/21/2023. -Consider physical therapy for  weakness and limited mobility, referral to be made and patient can decide based on insurance coverage.        Labs/tests ordered:  * No order type specified * Next appt:  11/15/2023

## 2023-10-23 DIAGNOSIS — M25511 Pain in right shoulder: Secondary | ICD-10-CM | POA: Diagnosis not present

## 2023-10-23 DIAGNOSIS — M6281 Muscle weakness (generalized): Secondary | ICD-10-CM | POA: Diagnosis not present

## 2023-10-24 ENCOUNTER — Ambulatory Visit (INDEPENDENT_AMBULATORY_CARE_PROVIDER_SITE_OTHER): Payer: Medicare PPO | Admitting: Sports Medicine

## 2023-10-24 ENCOUNTER — Encounter: Payer: Self-pay | Admitting: Sports Medicine

## 2023-10-24 VITALS — BP 130/60 | HR 91 | Temp 97.5°F | Resp 16 | Ht 60.0 in | Wt 124.0 lb

## 2023-10-24 DIAGNOSIS — K5900 Constipation, unspecified: Secondary | ICD-10-CM | POA: Diagnosis not present

## 2023-10-24 DIAGNOSIS — J309 Allergic rhinitis, unspecified: Secondary | ICD-10-CM

## 2023-10-24 DIAGNOSIS — R109 Unspecified abdominal pain: Secondary | ICD-10-CM | POA: Diagnosis not present

## 2023-10-24 DIAGNOSIS — K219 Gastro-esophageal reflux disease without esophagitis: Secondary | ICD-10-CM | POA: Diagnosis not present

## 2023-10-24 DIAGNOSIS — E876 Hypokalemia: Secondary | ICD-10-CM

## 2023-10-24 NOTE — Progress Notes (Signed)
 Careteam: Patient Care Team: Mahlon Gammon, MD as PCP - General (Internal Medicine) Marykay Lex, MD as PCP - Cardiology (Cardiology)  PLACE OF SERVICE:  [facility]  Advanced Directive information Does Patient Have a Medical Advance Directive?: Yes, Type of Advance Directive: Healthcare Power of [place];Living will;Out of facility DNR (pink MOST or yellow form), Does patient want to make changes to medical advance directive?: No - Patient declined  Allergies  Allergen Reactions   Fosamax [Alendronate Sodium]     Poor healing to mouth wound   Augmentin [Amoxicillin-Pot Clavulanate] Nausea Only    DID THE REACTION INVOLVE: Swelling of the face/tongue/throat, SOB, or low BP? No Sudden or severe rash/hives, skin peeling, or the inside of the mouth or nose? No Did it require medical treatment? No When did it last happen?       If all above answers are "NO", may proceed with cephalosporin use.    Hydrocodone Nausea And Vomiting   Mobic [Meloxicam] Rash   Sulfonamide Derivatives Rash    Chief Complaint  Patient presents with   Acute Visit    Patient complains of abdominal discomfort.      Discussed the use of AI scribe software for clinical note transcription with the patient, who gave verbal consent to proceed.  History of Present Illness   Robin [name] is an 89 year old female who presents with persistent abdominal discomfort following a stomach virus.    She has been experiencing persistent abdominal discomfort following a stomach virus that occurred a few weeks ago. The discomfort is described as a diffuse 'uncomfortable feeling' across her abdomen. No current nausea, vomiting, or diarrhea, as these symptoms have resolved. No blood in stool or urine, and no dizziness or lightheadedness.  She has been experiencing constipation since the illness, with bowel movements occurring every other day, requiring straining. She uses a stool softener to aid in bowel movements  and has had two successful bowel movements today. No blood in stool or urine is noted.  Her appetite has been poor, and she has not eaten much today, only consuming sugar-coated donuts and coffee for breakfast. No metallic taste or acid sensation in her stomach.  She has a history of taking pantoprazole for acid reflux, which she currently takes as needed. No sore throat, chest pain, or difficulty breathing, although she sometimes feels a sore throat. No burning sensation during urination is reported.  She reports having a persistent runny nose and takes Allegra for management.         Review of Systems:  Review of Systems  Constitutional:  Negative for chills and fever.  HENT:  Negative for congestion and sore throat.   Eyes:  Negative for double vision.  Respiratory:  Negative for cough, sputum production and shortness of breath.   Cardiovascular:  Negative for chest pain, palpitations and leg swelling.  Gastrointestinal:  Positive for abdominal pain (discomfort) and constipation. Negative for heartburn and nausea.  Genitourinary:  Negative for dysuria, frequency and hematuria.  Musculoskeletal:  Negative for falls and myalgias.  Neurological:  Negative for dizziness, sensory change and focal weakness.   Negative unless indicated in HPI.   Past Medical History:  Diagnosis Date   Arthritis    COPD (chronic obstructive pulmonary disease) (HCC)    Depression    Diverticulosis    GERD (gastroesophageal reflux disease)    Hyperlipemia    Hypertension    Irregular heart beat 2014   PVCs   Moderate to  severe aortic insufficiency 07/2019   Echo (03/08/2020): Aortic stenosis is now severe with mean gradient 60 mmHg. Aortic regurgitation is moderate to severe.   Osteoporosis    Severe aortic stenosis 06/11/2019   Echo 06/2019: EF 60-65%. Mild basal septal LVH, GR 2 DD. Mod LA dilation.- High LVEDP. Severe AI, Mod AS-peak gradient 43 mmHg, mean 28 mmHg;; 02/2020: Mean AoV Gradient 60  mmHg with Mod AI. EF 70-755.  Mod LVH. Mod LA dilation. Mod MR.   Past Surgical History:  Procedure Laterality Date   ABDOMINAL HYSTERECTOMY     both appy and hyst done together   APPENDECTOMY     CATARACT EXTRACTION     TRANSTHORACIC ECHOCARDIOGRAM  09/09/2021   : EF 60 to 65%.  Normal LV size and function.  Mild LVH.  Indeterminate D Fxn-elevated LAP with Mod LA dilation.  Severe MAC with moderate MR/MS (mean gradient 9.1 mmHg).  Moderate AoV calcification with severe thickening-> Mod to Severe AS/AI; no change.   TRANSTHORACIC ECHOCARDIOGRAM  09/06/2020   EF 60-65%.  Normal LV fxn.  Mod Conc LVH of basal septal segment.  Indeterminate filling pattern (likely Gr2DD w/ Mod LA dilation).  Nl RV size and function.  Mildly increased PAP ~ 39.5 mmHg.  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  Mild-Mod TR.  Severe AoV Ca++ -> Mod-Severe AI & AS (mean gradient 33 mmHg) - ? lower b.c lower LVEF?   Social History:   reports that she quit smoking about 15 years ago. Her smoking use included cigarettes. She started smoking about 45 years ago. She has a 30 pack-year smoking history. She has never used smokeless tobacco. She reports that she does not currently use alcohol. She reports that she does not use drugs.  Family History  Problem Relation Age of Onset   Lung cancer Father    Cancer Sister        biliary   Hypertension Mother    Cancer Sister     Medications: Patient's Medications  New Prescriptions   No medications on file  Previous Medications   ACETAMINOPHEN (TYLENOL) 650 MG CR TABLET    Take 650 mg by mouth in the morning, at noon, and at bedtime.    ALBUTEROL (VENTOLIN HFA) 108 (90 BASE) MCG/ACT INHALER    Inhale 2 puffs into the lungs every 6 (six) hours as needed for wheezing or shortness of breath.   ASPIRIN 81 MG TABLET    Take 81 mg by mouth daily.   LOVASTATIN (MEVACOR) 40 MG TABLET    TAKE 1 TABLET BY MOUTH EVERY DAY   MELATONIN PO    Take 2 mg  by mouth at bedtime as needed.   METHOCARBAMOL (ROBAXIN) 500 MG TABLET    Take 0.5 tablets (250 mg total) by mouth daily as needed for muscle spasms.   PANTOPRAZOLE (PROTONIX) 20 MG TABLET    TAKE 1 TABLET BY MOUTH EVERY DAY AS NEEDED   PROBIOTIC PRODUCT (PROBIOTIC DAILY PO)    Take 1 capsule by mouth daily.   SERTRALINE (ZOLOFT) 100 MG TABLET    TAKE 1 TABLET BY MOUTH EVERY DAY   TIOTROPIUM (SPIRIVA HANDIHALER) 18 MCG INHALATION CAPSULE    Place 1 capsule (18 mcg total) into inhaler and inhale daily.   VALSARTAN-HYDROCHLOROTHIAZIDE (DIOVAN-HCT) 320-12.5 MG TABLET    TAKE 1 TABLET BY MOUTH EVERY DAY  Modified Medications   No medications on file  Discontinued Medications  No medications on file    Physical Exam: Vitals:   10/24/23 1334  BP: 130/60  Pulse: 91  Resp: 16  Temp: (!) 97.5 F (36.4 C)  SpO2: 96%  Weight: 124 lb (56.2 kg)  Height: 5' (1.524 m)   Body mass index is 24.22 kg/m. BP Readings from Last 3 Encounters:  10/24/23 130/60  10/10/23 120/70  09/26/23 110/60   Wt Readings from Last 3 Encounters:  10/24/23 124 lb (56.2 kg)  10/10/23 127 lb (57.6 kg)  09/26/23 122 lb 9.6 oz (55.6 kg)    Physical Exam Constitutional:      Appearance: Normal appearance.  HENT:     Head: Normocephalic and atraumatic.  Cardiovascular:     Rate and Rhythm: Normal rate and regular rhythm.  Pulmonary:     Effort: Pulmonary effort is normal. No respiratory distress.     Breath sounds: Normal breath sounds. No wheezing.  Abdominal:     General: Bowel sounds are normal. There is no distension.     Tenderness: There is no abdominal tenderness. There is no guarding or rebound.     Comments:    Musculoskeletal:        General: No swelling or tenderness.  Neurological:     Mental Status: She is alert. Mental status is at baseline.     Sensory: No sensory deficit.     Motor: No weakness.     Labs reviewed: Basic Metabolic Panel: Recent Labs    05/17/23 0820 09/27/23 0830   NA 135 137  K 4.3 3.0*  CL 98 98  CO2 27 30  GLUCOSE 86 99  BUN 29* 19  CREATININE 1.00* 0.87  CALCIUM 9.4 8.7  TSH 1.50  --    Liver Function Tests: Recent Labs    05/17/23 0820 09/27/23 0830  AST 17 18  ALT 9 13  BILITOT 0.5 0.7  PROT 6.8 6.2   Recent Labs    09/27/23 0830  LIPASE 20   No results for input(s): "AMMONIA" in the last 8760 hours. CBC: Recent Labs    05/17/23 0820 09/27/23 0830  WBC 5.5 4.0  NEUTROABS 3,476 2,436  HGB 11.8 11.9  HCT 35.7 34.9*  MCV 90.8 88.6  PLT 213 193   Lipid Panel: Recent Labs    05/17/23 0820  CHOL 177  HDL 73  LDLCALC 87  TRIG 77  CHOLHDL 2.4   TSH: Recent Labs    05/17/23 0820  TSH 1.50   A1C: No results found for: "HGBA1C"  Assessment and Plan  1. Abdominal discomfort (Primary) No tenderness on palpation  Vitals stable Pt tolerating PO intake  Reports no nausea, vomiting, diarrhea  2. Constipation, unspecified constipation type -Increase fluid intake. -Continue stool softeners daily. -Increase physical activity.  3. Gastroesophageal reflux disease, unspecified whether esophagitis present Instructed patient to take protonix daily  Monitor for signs of bleeding  4. Allergic rhinitis, unspecified seasonality, unspecified trigger Cont  with allegra   5. Hypokalemia  Basic Metabolic Panel with eGFR

## 2023-10-25 LAB — BASIC METABOLIC PANEL WITH GFR
BUN: 17 mg/dL (ref 7–25)
CO2: 30 mmol/L (ref 20–32)
Calcium: 8.9 mg/dL (ref 8.6–10.4)
Chloride: 97 mmol/L — ABNORMAL LOW (ref 98–110)
Creat: 0.83 mg/dL (ref 0.60–0.95)
Glucose, Bld: 98 mg/dL (ref 65–139)
Potassium: 3.6 mmol/L (ref 3.5–5.3)
Sodium: 136 mmol/L (ref 135–146)
eGFR: 67 mL/min/{1.73_m2} (ref >=60)

## 2023-10-29 DIAGNOSIS — I350 Nonrheumatic aortic (valve) stenosis: Secondary | ICD-10-CM | POA: Diagnosis not present

## 2023-10-29 DIAGNOSIS — M6281 Muscle weakness (generalized): Secondary | ICD-10-CM | POA: Diagnosis not present

## 2023-10-29 DIAGNOSIS — M25511 Pain in right shoulder: Secondary | ICD-10-CM | POA: Diagnosis not present

## 2023-10-29 DIAGNOSIS — J449 Chronic obstructive pulmonary disease, unspecified: Secondary | ICD-10-CM | POA: Diagnosis not present

## 2023-11-02 DIAGNOSIS — I350 Nonrheumatic aortic (valve) stenosis: Secondary | ICD-10-CM | POA: Diagnosis not present

## 2023-11-02 DIAGNOSIS — M6281 Muscle weakness (generalized): Secondary | ICD-10-CM | POA: Diagnosis not present

## 2023-11-02 DIAGNOSIS — J449 Chronic obstructive pulmonary disease, unspecified: Secondary | ICD-10-CM | POA: Diagnosis not present

## 2023-11-02 DIAGNOSIS — M25511 Pain in right shoulder: Secondary | ICD-10-CM | POA: Diagnosis not present

## 2023-11-07 DIAGNOSIS — M6281 Muscle weakness (generalized): Secondary | ICD-10-CM | POA: Diagnosis not present

## 2023-11-07 DIAGNOSIS — M25511 Pain in right shoulder: Secondary | ICD-10-CM | POA: Diagnosis not present

## 2023-11-07 DIAGNOSIS — J449 Chronic obstructive pulmonary disease, unspecified: Secondary | ICD-10-CM | POA: Diagnosis not present

## 2023-11-07 DIAGNOSIS — I350 Nonrheumatic aortic (valve) stenosis: Secondary | ICD-10-CM | POA: Diagnosis not present

## 2023-11-12 DIAGNOSIS — I350 Nonrheumatic aortic (valve) stenosis: Secondary | ICD-10-CM | POA: Diagnosis not present

## 2023-11-12 DIAGNOSIS — M25511 Pain in right shoulder: Secondary | ICD-10-CM | POA: Diagnosis not present

## 2023-11-12 DIAGNOSIS — M6281 Muscle weakness (generalized): Secondary | ICD-10-CM | POA: Diagnosis not present

## 2023-11-12 DIAGNOSIS — J449 Chronic obstructive pulmonary disease, unspecified: Secondary | ICD-10-CM | POA: Diagnosis not present

## 2023-11-14 ENCOUNTER — Telehealth: Payer: Self-pay

## 2023-11-14 NOTE — Telephone Encounter (Signed)
 Copied from CRM [id number]. Topic: Clinical - Request for Lab/Test Order >> Nov 14, 2023  3:01 PM Adrianna P wrote: Reason for CRM: pt scheduled for labs on 3/20 but just had labs done on 2/26 and was seeing if she still needed to go to her lab appt.

## 2023-11-14 NOTE — Telephone Encounter (Signed)
 Lets Cancel the Appointment

## 2023-11-15 ENCOUNTER — Other Ambulatory Visit: Payer: Medicare PPO

## 2023-11-15 DIAGNOSIS — M25511 Pain in right shoulder: Secondary | ICD-10-CM | POA: Diagnosis not present

## 2023-11-15 DIAGNOSIS — E7849 Other hyperlipidemia: Secondary | ICD-10-CM | POA: Diagnosis not present

## 2023-11-15 DIAGNOSIS — F339 Major depressive disorder, recurrent, unspecified: Secondary | ICD-10-CM | POA: Diagnosis not present

## 2023-11-15 DIAGNOSIS — J449 Chronic obstructive pulmonary disease, unspecified: Secondary | ICD-10-CM | POA: Diagnosis not present

## 2023-11-15 DIAGNOSIS — I10 Essential (primary) hypertension: Secondary | ICD-10-CM | POA: Diagnosis not present

## 2023-11-15 DIAGNOSIS — M6281 Muscle weakness (generalized): Secondary | ICD-10-CM | POA: Diagnosis not present

## 2023-11-15 DIAGNOSIS — I350 Nonrheumatic aortic (valve) stenosis: Secondary | ICD-10-CM | POA: Diagnosis not present

## 2023-11-15 LAB — CBC WITH DIFFERENTIAL/PLATELET
Absolute Lymphocytes: 1272 {cells}/uL (ref 850–3900)
Absolute Monocytes: 360 {cells}/uL (ref 200–950)
Basophils Absolute: 48 {cells}/uL (ref 0–200)
Basophils Relative: 0.9 %
Eosinophils Absolute: 207 {cells}/uL (ref 15–500)
Eosinophils Relative: 3.9 %
HCT: 31.8 % — ABNORMAL LOW (ref 35.0–45.0)
Hemoglobin: 10.6 g/dL — ABNORMAL LOW (ref 11.7–15.5)
MCH: 29.4 pg (ref 27.0–33.0)
MCHC: 33.3 g/dL (ref 32.0–36.0)
MCV: 88.1 fL (ref 80.0–100.0)
MPV: 10 fL (ref 7.5–12.5)
Monocytes Relative: 6.8 %
Neutro Abs: 3413 {cells}/uL (ref 1500–7800)
Neutrophils Relative %: 64.4 %
Platelets: 225 10*3/uL (ref 140–400)
RBC: 3.61 10*6/uL — ABNORMAL LOW (ref 3.80–5.10)
RDW: 12.7 % (ref 11.0–15.0)
Total Lymphocyte: 24 %
WBC: 5.3 10*3/uL (ref 3.8–10.8)

## 2023-11-15 LAB — COMPREHENSIVE METABOLIC PANEL
AG Ratio: 1.8 (calc) (ref 1.0–2.5)
ALT: 10 U/L (ref 6–29)
AST: 17 U/L (ref 10–35)
Albumin: 4.2 g/dL (ref 3.6–5.1)
Alkaline phosphatase (APISO): 62 U/L (ref 37–153)
BUN: 17 mg/dL (ref 7–25)
CO2: 28 mmol/L (ref 20–32)
Calcium: 8.7 mg/dL (ref 8.6–10.4)
Chloride: 100 mmol/L (ref 98–110)
Creat: 0.9 mg/dL (ref 0.60–0.95)
Globulin: 2.3 g/dL (ref 1.9–3.7)
Glucose, Bld: 101 mg/dL — ABNORMAL HIGH (ref 65–99)
Potassium: 3.8 mmol/L (ref 3.5–5.3)
Sodium: 138 mmol/L (ref 135–146)
Total Bilirubin: 0.6 mg/dL (ref 0.2–1.2)
Total Protein: 6.5 g/dL (ref 6.1–8.1)
eGFR: 61 mL/min/{1.73_m2} (ref >=60)

## 2023-11-15 LAB — TSH: TSH: 1.83 m[IU]/L (ref 0.40–4.50)

## 2023-11-15 LAB — LIPID PANEL
Cholesterol: 150 mg/dL (ref <200)
HDL: 53 mg/dL (ref >=50)
LDL Cholesterol (Calc): 79 mg/dL
Non-HDL Cholesterol (Calc): 97 mg/dL (ref <130)
Total CHOL/HDL Ratio: 2.8 (calc) (ref <5.0)
Triglycerides: 101 mg/dL (ref <150)

## 2023-11-15 NOTE — Telephone Encounter (Signed)
 Response was provided outside of office hours and patients lab appointment was at 7:45 am (prior to office hours at [facility]).

## 2023-11-20 DIAGNOSIS — I350 Nonrheumatic aortic (valve) stenosis: Secondary | ICD-10-CM | POA: Diagnosis not present

## 2023-11-20 DIAGNOSIS — M25511 Pain in right shoulder: Secondary | ICD-10-CM | POA: Diagnosis not present

## 2023-11-20 DIAGNOSIS — J449 Chronic obstructive pulmonary disease, unspecified: Secondary | ICD-10-CM | POA: Diagnosis not present

## 2023-11-20 DIAGNOSIS — M6281 Muscle weakness (generalized): Secondary | ICD-10-CM | POA: Diagnosis not present

## 2023-11-21 ENCOUNTER — Non-Acute Institutional Stay: Payer: Medicare PPO | Admitting: Internal Medicine

## 2023-11-21 ENCOUNTER — Encounter: Payer: Self-pay | Admitting: Internal Medicine

## 2023-11-21 VITALS — BP 122/87 | HR 93 | Temp 96.8°F | Resp 18 | Ht 60.0 in | Wt 122.8 lb

## 2023-11-21 DIAGNOSIS — M545 Low back pain, unspecified: Secondary | ICD-10-CM

## 2023-11-21 DIAGNOSIS — I351 Nonrheumatic aortic (valve) insufficiency: Secondary | ICD-10-CM

## 2023-11-21 DIAGNOSIS — J410 Simple chronic bronchitis: Secondary | ICD-10-CM

## 2023-11-21 DIAGNOSIS — D649 Anemia, unspecified: Secondary | ICD-10-CM | POA: Diagnosis not present

## 2023-11-21 DIAGNOSIS — F339 Major depressive disorder, recurrent, unspecified: Secondary | ICD-10-CM

## 2023-11-21 DIAGNOSIS — I10 Essential (primary) hypertension: Secondary | ICD-10-CM

## 2023-11-21 DIAGNOSIS — G8929 Other chronic pain: Secondary | ICD-10-CM

## 2023-11-21 DIAGNOSIS — W19XXXD Unspecified fall, subsequent encounter: Secondary | ICD-10-CM

## 2023-11-21 DIAGNOSIS — E7849 Other hyperlipidemia: Secondary | ICD-10-CM

## 2023-11-21 DIAGNOSIS — K219 Gastro-esophageal reflux disease without esophagitis: Secondary | ICD-10-CM

## 2023-11-23 DIAGNOSIS — M6281 Muscle weakness (generalized): Secondary | ICD-10-CM | POA: Diagnosis not present

## 2023-11-25 NOTE — Progress Notes (Signed)
 [facility]: [facility] Service:  Clinic (12)  Provider:   Code Status: DNR Goals of Care:     10/24/2023    1:41 PM  Advanced Directives  Does Patient Have a Medical Advance Directive? Yes  Type of Estate agent of [place];Living will;Out of facility DNR (pink MOST or yellow form)  Does patient want to make changes to medical advance directive? No - Patient declined  Copy of Healthcare Power of Attorney in Chart? No - copy requested     Chief Complaint  Patient presents with   Medical Management of Chronic Issues    Six Months follow-up, Needs to discuss DTAP and Covid/Medicare Annual Wellness Visit    HPI: Patient is a 89 y.o. female seen today for an acute visit aafter Fall and to get prescription for Power Wheelchair [place] in [facility] Has h/o Severe AS with AR  Depression and anxiety Chronic back pain  Alternating diarrhea and constipation questionable IBS.  Has seen GI. COPD HLD, hypertension  She had Gastroenteritis in 02/25 Took almost one month to recover But she is at baseline now She is working with therapy  But feels weak and SOB very easily due to her Severe AS  and COPD She also had a fall Yesterday when she was trying to pick something and fell on her Head Was eval by the facility nurse and was told to go to ED but patient refused.  She does have a small hematoma on back of her head.  But does not have any other issues.  She denied any dizziness syncope or any other issues when she fell.      Past Medical History:  Diagnosis Date   Arthritis    COPD (chronic obstructive pulmonary disease) (HCC)    Depression    Diverticulosis    GERD (gastroesophageal reflux disease)    Hyperlipemia    Hypertension    Irregular heart beat 2014   PVCs   Moderate to severe aortic insufficiency 07/2019   Echo (03/08/2020): Aortic stenosis is now severe with mean gradient 60 mmHg. Aortic regurgitation is moderate to severe.    Osteoporosis    Severe aortic stenosis 06/11/2019   Echo 06/2019: EF 60-65%. Mild basal septal LVH, GR 2 DD. Mod LA dilation.- High LVEDP. Severe AI, Mod AS-peak gradient 43 mmHg, mean 28 mmHg;; 02/2020: Mean AoV Gradient 60 mmHg with Mod AI. EF 70-755.  Mod LVH. Mod LA dilation. Mod MR.    Past Surgical History:  Procedure Laterality Date   ABDOMINAL HYSTERECTOMY     both appy and hyst done together   APPENDECTOMY     CATARACT EXTRACTION     TRANSTHORACIC ECHOCARDIOGRAM  09/09/2021   : EF 60 to 65%.  Normal LV size and function.  Mild LVH.  Indeterminate D Fxn-elevated LAP with Mod LA dilation.  Severe MAC with moderate MR/MS (mean gradient 9.1 mmHg).  Moderate AoV calcification with severe thickening-> Mod to Severe AS/AI; no change.   TRANSTHORACIC ECHOCARDIOGRAM  09/06/2020   EF 60-65%.  Normal LV fxn.  Mod Conc LVH of basal septal segment.  Indeterminate filling pattern (likely Gr2DD w/ Mod LA dilation).  Nl RV size and function.  Mildly increased PAP ~ 39.5 mmHg.  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  Mild-Mod TR.  Severe AoV Ca++ -> Mod-Severe AI & AS (mean gradient 33 mmHg) - ? lower b.c lower LVEF?  Allergies  Allergen Reactions   Fosamax [Alendronate Sodium]     Poor healing to mouth wound   Augmentin [Amoxicillin-Pot Clavulanate] Nausea Only    DID THE REACTION INVOLVE: Swelling of the face/tongue/throat, SOB, or low BP? No Sudden or severe rash/hives, skin peeling, or the inside of the mouth or nose? No Did it require medical treatment? No When did it last happen?       If all above answers are "NO", may proceed with cephalosporin use.    Hydrocodone Nausea And Vomiting   Mobic [Meloxicam] Rash   Sulfonamide Derivatives Rash    Outpatient Encounter Medications as of 11/21/2023  Medication Sig   acetaminophen (TYLENOL) 650 MG CR tablet Take 650 mg by mouth in the morning, at noon, and at bedtime.    albuterol (VENTOLIN HFA) 108 (90 Base)  MCG/ACT inhaler Inhale 2 puffs into the lungs every 6 (six) hours as needed for wheezing or shortness of breath.   aspirin 81 MG tablet Take 81 mg by mouth daily.   lovastatin (MEVACOR) 40 MG tablet TAKE 1 TABLET BY MOUTH EVERY DAY   MELATONIN PO Take 2 mg by mouth at bedtime as needed.   pantoprazole (PROTONIX) 20 MG tablet TAKE 1 TABLET BY MOUTH EVERY DAY AS NEEDED   Probiotic Product (PROBIOTIC DAILY PO) Take 1 capsule by mouth daily.   sertraline (ZOLOFT) 100 MG tablet TAKE 1 TABLET BY MOUTH EVERY DAY   tiotropium (SPIRIVA HANDIHALER) 18 MCG inhalation capsule Place 1 capsule (18 mcg total) into inhaler and inhale daily.   valsartan-hydrochlorothiazide (DIOVAN-HCT) 320-12.5 MG tablet TAKE 1 TABLET BY MOUTH EVERY DAY   [DISCONTINUED] methocarbamol (ROBAXIN) 500 MG tablet Take 0.5 tablets (250 mg total) by mouth daily as needed for muscle spasms.   No facility-administered encounter medications on file as of 11/21/2023.    Review of Systems:  Review of Systems  Constitutional:  Negative for activity change and appetite change.  HENT: Negative.    Respiratory:  Positive for shortness of breath. Negative for cough.   Cardiovascular:  Negative for leg swelling.  Gastrointestinal:  Negative for constipation.  Genitourinary: Negative.   Musculoskeletal:  Positive for gait problem. Negative for arthralgias and myalgias.  Skin: Negative.   Neurological:  Negative for dizziness and weakness.  Psychiatric/Behavioral:  Negative for confusion, dysphoric mood and sleep disturbance.     Health Maintenance  Topic Date Due   DTaP/Tdap/Td (2 - Td or Tdap) 11/06/2022   COVID-19 Vaccine (6 - 2024-25 season) 04/29/2023   Medicare Annual Wellness (AWV)  01/08/2024   Pneumonia Vaccine 3+ Years old  Completed   INFLUENZA VACCINE  Completed   DEXA SCAN  Completed   HPV VACCINES  Aged Out   Zoster Vaccines- Shingrix  Discontinued    Physical Exam: Vitals:   11/21/23 1434  BP: 122/87  Pulse: 93   Resp: 18  Temp: (!) 96.8 F (36 C)  SpO2: 97%  Weight: 122 lb 12.8 oz (55.7 kg)  Height: 5' (1.524 m)   Body mass index is 23.98 kg/m. Physical Exam Vitals reviewed.  Constitutional:      Appearance: Normal appearance.  HENT:     Head: Normocephalic.     Nose: Nose normal.     Mouth/Throat:     Mouth: Mucous membranes are moist.     Pharynx: Oropharynx is clear.  Eyes:     Pupils: Pupils are equal, round, and reactive to light.  Cardiovascular:     Rate and Rhythm: Normal  rate and regular rhythm.     Pulses: Normal pulses.     Heart sounds: Murmur heard.  Pulmonary:     Effort: Pulmonary effort is normal.     Breath sounds: Normal breath sounds.  Abdominal:     General: Abdomen is flat. Bowel sounds are normal.     Palpations: Abdomen is soft.  Musculoskeletal:        General: No swelling.     Cervical back: Neck supple.  Skin:    General: Skin is warm.  Neurological:     Mental Status: She is alert and oriented to person, place, and time.     Comments: Did have Hematoma on her Head  Psychiatric:        Mood and Affect: Mood normal.        Thought Content: Thought content normal.     Labs reviewed: Basic Metabolic Panel: Recent Labs    05/17/23 0820 09/27/23 0830 10/24/23 1358 11/15/23 0850  NA 135 137 136 138  K 4.3 3.0* 3.6 3.8  CL 98 98 97* 100  CO2 27 30 30 28   GLUCOSE 86 99 98 101*  BUN 29* 19 17 17   CREATININE 1.00* 0.87 0.83 0.90  CALCIUM 9.4 8.7 8.9 8.7  TSH 1.50  --   --  1.83   Liver Function Tests: Recent Labs    05/17/23 0820 09/27/23 0830 11/15/23 0850  AST 17 18 17   ALT 9 13 10   BILITOT 0.5 0.7 0.6  PROT 6.8 6.2 6.5   Recent Labs    09/27/23 0830  LIPASE 20   No results for input(s): "AMMONIA" in the last 8760 hours. CBC: Recent Labs    05/17/23 0820 09/27/23 0830 11/15/23 0850  WBC 5.5 4.0 5.3  NEUTROABS 3,476 2,436 3,413  HGB 11.8 11.9 10.6*  HCT 35.7 34.9* 31.8*  MCV 90.8 88.6 88.1  PLT 213 193 225   Lipid  Panel: Recent Labs    05/17/23 0820 11/15/23 0850  CHOL 177 150  HDL 73 53  LDLCALC 87 79  TRIG 77 101  CHOLHDL 2.4 2.8   No results found for: "HGBA1C"  Procedures since last visit: No results found.  Assessment/Plan Need For Power Chair  Patient has a history of COPD and severe AS she is working with OT.  Gets very easily short of breath.  Is able to walk with her walker for few feeds but is limited due to her shortness of breath.  I have talked to OT and written a prescription for power chair S/p fall She seems to have recovered from the fall. At this time we will continue to monitor Essential hypertension I had taken her off Norvasc due to low blood pressures She is doing well on Diovan/HCT  Other issues Severe aortic insufficiency and AS Decided against AVR Staying stable Per cardiology After load reduction Simple chronic bronchitis (HCC) Sometimes has to use Proair otherwise stable on Spiriva    Chronic bilateral low back pain without sciatica  tylenol Can use Salon pas /Voltaren   Depression, recurrent (HCC) Stable on Zoloft    Osteoporosis, unspecified osteoporosis type, unspecified pathological fracture presence Received Reclast for 8 years Does not want Prolia right now DEXA in 12/22 Femur -2.0 Radius -3.7   Hyponatremia stable   Other hyperlipidemia Statin Wants to stop it eventually    Slow transit constipation Can use senna as needed   Labs/tests ordered:  * No order type specified * Next appt:  01/14/2024

## 2023-11-26 DIAGNOSIS — J449 Chronic obstructive pulmonary disease, unspecified: Secondary | ICD-10-CM | POA: Diagnosis not present

## 2023-11-26 DIAGNOSIS — M6281 Muscle weakness (generalized): Secondary | ICD-10-CM | POA: Diagnosis not present

## 2023-11-26 DIAGNOSIS — M25511 Pain in right shoulder: Secondary | ICD-10-CM | POA: Diagnosis not present

## 2023-11-26 DIAGNOSIS — I350 Nonrheumatic aortic (valve) stenosis: Secondary | ICD-10-CM | POA: Diagnosis not present

## 2023-11-28 DIAGNOSIS — M6281 Muscle weakness (generalized): Secondary | ICD-10-CM | POA: Diagnosis not present

## 2023-11-30 DIAGNOSIS — M25511 Pain in right shoulder: Secondary | ICD-10-CM | POA: Diagnosis not present

## 2023-11-30 DIAGNOSIS — M6281 Muscle weakness (generalized): Secondary | ICD-10-CM | POA: Diagnosis not present

## 2023-11-30 DIAGNOSIS — I350 Nonrheumatic aortic (valve) stenosis: Secondary | ICD-10-CM | POA: Diagnosis not present

## 2023-11-30 DIAGNOSIS — J449 Chronic obstructive pulmonary disease, unspecified: Secondary | ICD-10-CM | POA: Diagnosis not present

## 2023-12-02 ENCOUNTER — Other Ambulatory Visit: Payer: Self-pay | Admitting: Internal Medicine

## 2023-12-03 DIAGNOSIS — M6281 Muscle weakness (generalized): Secondary | ICD-10-CM | POA: Diagnosis not present

## 2023-12-03 DIAGNOSIS — J449 Chronic obstructive pulmonary disease, unspecified: Secondary | ICD-10-CM | POA: Diagnosis not present

## 2023-12-03 DIAGNOSIS — M25511 Pain in right shoulder: Secondary | ICD-10-CM | POA: Diagnosis not present

## 2023-12-03 DIAGNOSIS — I350 Nonrheumatic aortic (valve) stenosis: Secondary | ICD-10-CM | POA: Diagnosis not present

## 2023-12-05 DIAGNOSIS — M6281 Muscle weakness (generalized): Secondary | ICD-10-CM | POA: Diagnosis not present

## 2023-12-05 DIAGNOSIS — I350 Nonrheumatic aortic (valve) stenosis: Secondary | ICD-10-CM | POA: Diagnosis not present

## 2023-12-05 DIAGNOSIS — M25511 Pain in right shoulder: Secondary | ICD-10-CM | POA: Diagnosis not present

## 2023-12-05 DIAGNOSIS — J449 Chronic obstructive pulmonary disease, unspecified: Secondary | ICD-10-CM | POA: Diagnosis not present

## 2023-12-07 DIAGNOSIS — M6281 Muscle weakness (generalized): Secondary | ICD-10-CM | POA: Diagnosis not present

## 2023-12-10 DIAGNOSIS — M6281 Muscle weakness (generalized): Secondary | ICD-10-CM | POA: Diagnosis not present

## 2023-12-10 DIAGNOSIS — M25511 Pain in right shoulder: Secondary | ICD-10-CM | POA: Diagnosis not present

## 2023-12-10 DIAGNOSIS — J449 Chronic obstructive pulmonary disease, unspecified: Secondary | ICD-10-CM | POA: Diagnosis not present

## 2023-12-10 DIAGNOSIS — I350 Nonrheumatic aortic (valve) stenosis: Secondary | ICD-10-CM | POA: Diagnosis not present

## 2023-12-11 DIAGNOSIS — M6281 Muscle weakness (generalized): Secondary | ICD-10-CM | POA: Diagnosis not present

## 2023-12-14 DIAGNOSIS — J449 Chronic obstructive pulmonary disease, unspecified: Secondary | ICD-10-CM | POA: Diagnosis not present

## 2023-12-14 DIAGNOSIS — M6281 Muscle weakness (generalized): Secondary | ICD-10-CM | POA: Diagnosis not present

## 2023-12-14 DIAGNOSIS — M25511 Pain in right shoulder: Secondary | ICD-10-CM | POA: Diagnosis not present

## 2023-12-14 DIAGNOSIS — I350 Nonrheumatic aortic (valve) stenosis: Secondary | ICD-10-CM | POA: Diagnosis not present

## 2023-12-17 DIAGNOSIS — M25511 Pain in right shoulder: Secondary | ICD-10-CM | POA: Diagnosis not present

## 2023-12-17 DIAGNOSIS — J449 Chronic obstructive pulmonary disease, unspecified: Secondary | ICD-10-CM | POA: Diagnosis not present

## 2023-12-17 DIAGNOSIS — I350 Nonrheumatic aortic (valve) stenosis: Secondary | ICD-10-CM | POA: Diagnosis not present

## 2023-12-17 DIAGNOSIS — M6281 Muscle weakness (generalized): Secondary | ICD-10-CM | POA: Diagnosis not present

## 2023-12-18 DIAGNOSIS — L821 Other seborrheic keratosis: Secondary | ICD-10-CM | POA: Diagnosis not present

## 2023-12-18 DIAGNOSIS — L814 Other melanin hyperpigmentation: Secondary | ICD-10-CM | POA: Diagnosis not present

## 2023-12-18 DIAGNOSIS — L570 Actinic keratosis: Secondary | ICD-10-CM | POA: Diagnosis not present

## 2023-12-18 DIAGNOSIS — Z85828 Personal history of other malignant neoplasm of skin: Secondary | ICD-10-CM | POA: Diagnosis not present

## 2023-12-18 DIAGNOSIS — D225 Melanocytic nevi of trunk: Secondary | ICD-10-CM | POA: Diagnosis not present

## 2023-12-19 ENCOUNTER — Encounter: Payer: Self-pay | Admitting: Cardiology

## 2023-12-19 ENCOUNTER — Ambulatory Visit: Attending: Cardiology | Admitting: Cardiology

## 2023-12-19 VITALS — BP 120/50 | HR 79 | Ht 60.0 in | Wt 122.4 lb

## 2023-12-19 DIAGNOSIS — J438 Other emphysema: Secondary | ICD-10-CM | POA: Diagnosis not present

## 2023-12-19 DIAGNOSIS — I493 Ventricular premature depolarization: Secondary | ICD-10-CM

## 2023-12-19 DIAGNOSIS — I340 Nonrheumatic mitral (valve) insufficiency: Secondary | ICD-10-CM

## 2023-12-19 DIAGNOSIS — Z7189 Other specified counseling: Secondary | ICD-10-CM

## 2023-12-19 DIAGNOSIS — I352 Nonrheumatic aortic (valve) stenosis with insufficiency: Secondary | ICD-10-CM | POA: Diagnosis not present

## 2023-12-19 DIAGNOSIS — I350 Nonrheumatic aortic (valve) stenosis: Secondary | ICD-10-CM | POA: Diagnosis not present

## 2023-12-19 DIAGNOSIS — E785 Hyperlipidemia, unspecified: Secondary | ICD-10-CM

## 2023-12-19 DIAGNOSIS — I10 Essential (primary) hypertension: Secondary | ICD-10-CM | POA: Diagnosis not present

## 2023-12-19 DIAGNOSIS — M6281 Muscle weakness (generalized): Secondary | ICD-10-CM | POA: Diagnosis not present

## 2023-12-19 NOTE — Patient Instructions (Signed)
 Medication Instructions:  No changes  *If you need a refill on your cardiac medications before your next appointment, please call your pharmacy*   Lab Work: Not needed If you have labs (blood work) drawn today and your tests are completely normal, you will receive your results only by: MyChart Message (if you have MyChart) OR A paper copy in the mail If you have any lab test that is abnormal or we need to change your treatment, we will call you to review the results.   Testing/Procedures:    Follow-Up: At [facility], you and your health needs are our priority.  As part of our continuing mission to provide you with exceptional heart care, we have created designated Provider Care Teams.  These Care Teams include your primary Cardiologist (physician) and Advanced Practice Providers (APPs -  Physician Assistants and Nurse Practitioners) who all work together to provide you with the care you need, when you need it.     Your next appointment:   12 month(s)  The format for your next appointment:   In Person  Provider:   Randene Bustard, MD    Other Instructions   s

## 2023-12-19 NOTE — Progress Notes (Signed)
 Cardiology Office Note:  .   Date:  12/23/2023  ID:  Robin [name], DOB 1932/04/01, MRN [id number] PCP: Marguerite Shiley, MD  [facility] Providers Cardiologist:  Randene Bustard, MD     Chief Complaint  Patient presents with   Follow-up    Early annual follow-up.  Maybe a little more short of breath than before.   Cardiac Valve Problem    Severe aortic stenosis.  Expectant management.    Patient Profile: .     Robin [name] is a very pleasant 89 y.o. female  with a PMH noted below who presents here for 10 month at the request of Marguerite Shiley, MD.  PMH  Significant AORTIC STENOSIS/INSUFFICIENCY (Mod-Severe AI/Severe AS,  along with Mod MR). Referred to valve clinic-seen by Dr. Abel Hoe in January 2022, per her wishes, no plans to pursue further evaluation or treatment.  Does not want TAVR. => As such, we have stopped doing follow-up studies. HTN, HLD, palpitations,  Intolerant of beta-blocker due to fatigue and wheezing. BP controlled on amlodipine  2.5 mg daily along with valsartan HCTZ COPD => only using Spiriva .  Stopped taking Breo. GERD.    Robin SCHROEPFER was last seen on February 20, 2023: Overall pretty stable.  A little bit dyspneic compared to baseline with some wheezing.  She just returned from a 2-week trip to the beach with her son and grandkids and was somewhat worn out.  She did finally a trip besides having a GI bug with loose stools and constipation issues.  Mostly noting fatigue.  She states that she was able to do what ever she wanted to do on the trip as far as walking around all over [address]  She had some auditory wheezes at the time citing some allergies, and was asking for inhaler (she had stopped taking Breo).  She reiterated her decision to forego further evaluation of her aortic valve declining TAVR workup.  Subjective  Discussed the use of AI scribe software for clinical note transcription with the patient, who gave verbal consent to  proceed. History of Present Illness Robin [name] is an 89 year old female with Severe Aortic Stenosis who presents for follow-up of her condition.  During past visits, she is medically she is not interested in pursuing evaluation for and management with TAVR, citing her advanced age and current lack of symptoms.  She experiences shortness of breath when walking longer distances, such as from her apartment to the dining room. She uses a walker for assistance and has a motorized scooter for longer distances. No chest pain, pressure, tightness, heart racing, or irregular heartbeats. No dizziness, lightheadedness, or syncope. Occasional leg swelling but not routinely. No orthopnea or paroxysmal nocturnal dyspnea.  She takes valsartan HCTZ for blood pressure management and was previously on amlodipine , which was discontinued due to fluctuations in blood pressure. She also takes lovastatin for cholesterol management, and her recent labs in March showed good results.  She experiences allergies, taking loratadine daily and using nasal lavage once or twice a week. She had a severe episode about a month ago that lasted three weeks, but symptoms have since improved. She avoids going outside during high pollen times due to exacerbation of symptoms.  She mentions a recent fall in her apartment, which resulted in a contusion on her back. She did not experience syncope but lost her balance while trying to pick up a bottle of water. She uses a medical alert system in her apartment for safety.  Objective   Medications - Aspirin  81 mg - Valsartan-HCTZ 320-12.5 mg daily - Lovastatin 40 mg daily - Sertraline  100 mg daily - Spiriva  inhaler - Albuterol  inhaler PRN - Protonix  20 mg PRN  Studies Reviewed: [name]   EKG Interpretation Date/Time:  Wednesday December 19 2023 10:53:34 EDT Ventricular Rate:  79 PR Interval:  244 QRS Duration:  88 QT Interval:  394 QTC Calculation: 451 R Axis:   52  Text  Interpretation: Sinus rhythm with 1st degree A-V block Left ventricular hypertrophy with repolarization abnormality ( Sokolow-Lyon ) When compared with ECG of 05-Sep-2018 04:19, Premature atrial complexes are no longer Present Confirmed by Randene Bustard ([id number]) on 12/19/2023 10:55:43 AM    Lab Results  Component Value Date   CHOL 150 11/15/2023   HDL 53 11/15/2023   LDLCALC 79 11/15/2023   TRIG 101 11/15/2023   CHOLHDL 2.8 11/15/2023   Lab Results  Component Value Date   NA 138 11/15/2023   K 3.8 11/15/2023   CREATININE 0.90 11/15/2023   EGFR 61 11/15/2023   GLUCOSE 101 (H) 11/15/2023    Risk Assessment/Calculations:           Physical Exam:   VS:  BP (!) 120/50 (BP Location: Left Arm, Patient Position: Sitting, Cuff Size: Normal)   Pulse 79   Ht 5' (1.524 m)   Wt 122 lb 6.4 oz (55.5 kg)   SpO2 92%   BMI 23.90 kg/m    Wt Readings from Last 3 Encounters:  12/19/23 122 lb 6.4 oz (55.5 kg)  11/21/23 122 lb 12.8 oz (55.7 kg)  10/24/23 124 lb (56.2 kg)    GEN: Well nourished, well groomed in no acute distress; healthy appearing NECK: No JVD; radiated AS murmur with parvus at tardus pulse CARDIAC: RRR with ectopy.  Normal S1 with soft S2.  S4 gallop.  [place]/6 SEM at RUSB-neck RESPIRATORY:  Clear to auscultation without rales, wheezing or rhonchi ; nonlabored, good air movement. ABDOMEN: Soft, non-tender, non-distended EXTREMITIES:  No edema; No deformity      ASSESSMENT AND PLAN: .    Problem List Items Addressed This Visit       Cardiology Problems   Dyslipidemia, goal LDL below 100 (Chronic)   Hyperlipidemia managed with lovastatin. Recent cholesterol levels within acceptable range. Lab Results  Component Value Date   CHOL 150 11/15/2023   HDL 53 11/15/2023   LDLCALC 79 11/15/2023   TRIG 101 11/15/2023   CHOLHDL 2.8 11/15/2023   - Continue lovastatin as prescribed. - Monitor cholesterol levels as per routine schedule.      Essential hypertension (Chronic)    Well-controlled BP.  Important for afterload reduction. Had previously been on amlodipine  that was discontinued due to fluctuations in BP with occasional very low blood pressures.  Current readings satisfactory. - Continue valsartan HCTZ as prescribed.  (320-12.5 milligram daily) - Monitor blood pressure regularly. - Consider reducing valsartan dose or discontinuing HCTZ if blood pressure decreases further.      Relevant Orders   EKG 12-Lead (Completed)   Mitral valve insufficiency (Chronic)   Continue afterload reduction      Nonrheumatic aortic insufficiency with aortic stenosis (Chronic)   Associated with aortic stenosis.  Treatment per AS      PVC's (premature ventricular contractions) (Chronic)   Stable.  Not currently on beta-blocker due to exacerbation of reactive airway disease.      Relevant Orders   EKG 12-Lead (Completed)   Severe aortic stenosis by prior echocardiogram - Primary (  Chronic)   Severe aortic valve stenosis, mildly symptomatic with some exertional dyspnea, but mostly asymptomatic from a significant symptom standpoint. TAVR deferred due to current asymptomatic status, and patient wishes. - Monitor for chest pain, syncope, or arrhythmias. - Maintain current blood pressure management. - Discuss goals of care and potential interventions if symptoms develop. => Discussed CODE STATUS indicating that where she to go into cardiac arrest with her severe stenosis, CPR would be unsuccessful.  She does not currently have a DNR status-suggest that she discuss with PCP and family        Other   COPD (chronic obstructive pulmonary disease) (HCC) (Chronic)   Thankfully, she is back on Spiriva  with as needed albuterol .      Goals of care, counseling/discussion (Chronic)   Discussed potential interventions and DNR status due to severe aortic valve stenosis. Considering DNR order. - Discuss and document goals of care and DNR status.           Follow-Up: -  Schedule follow-up appointment in one year. Return in about 1 year (around 12/18/2024) for [employer]. - Advise to return sooner if symptoms develop or change.  Total time spent: 24 min spent with patient + 12 min spent charting = 36 min Recording duration: 24 minutes   Signed, Arleen Lacer, MD, MS Randene Bustard, M.D., M.S. Interventional Cardiologist  [facility]  Pager # [phone] Phone # [phone] [address] [place], [place] [address]

## 2023-12-20 DIAGNOSIS — I350 Nonrheumatic aortic (valve) stenosis: Secondary | ICD-10-CM | POA: Diagnosis not present

## 2023-12-20 DIAGNOSIS — M25511 Pain in right shoulder: Secondary | ICD-10-CM | POA: Diagnosis not present

## 2023-12-20 DIAGNOSIS — M6281 Muscle weakness (generalized): Secondary | ICD-10-CM | POA: Diagnosis not present

## 2023-12-20 DIAGNOSIS — J449 Chronic obstructive pulmonary disease, unspecified: Secondary | ICD-10-CM | POA: Diagnosis not present

## 2023-12-21 DIAGNOSIS — M6281 Muscle weakness (generalized): Secondary | ICD-10-CM | POA: Diagnosis not present

## 2023-12-23 ENCOUNTER — Encounter: Payer: Self-pay | Admitting: Cardiology

## 2023-12-23 DIAGNOSIS — Z7189 Other specified counseling: Secondary | ICD-10-CM | POA: Insufficient documentation

## 2023-12-23 NOTE — Assessment & Plan Note (Signed)
 Thankfully, she is back on Spiriva  with as needed albuterol .

## 2023-12-23 NOTE — Assessment & Plan Note (Addendum)
 Severe aortic valve stenosis, mildly symptomatic with some exertional dyspnea, but mostly asymptomatic from a significant symptom standpoint. TAVR deferred due to current asymptomatic status, and patient wishes. - Monitor for chest pain, syncope, or arrhythmias. - Maintain current blood pressure management. - Discuss goals of care and potential interventions if symptoms develop. => Discussed CODE STATUS indicating that where she to go into cardiac arrest with her severe stenosis, CPR would be unsuccessful.  She does not currently have a DNR status-suggest that she discuss with PCP and family

## 2023-12-23 NOTE — Assessment & Plan Note (Signed)
 Associated with aortic stenosis.  Treatment per AS

## 2023-12-23 NOTE — Assessment & Plan Note (Signed)
 Continue afterload reduction

## 2023-12-23 NOTE — Assessment & Plan Note (Signed)
 Discussed potential interventions and DNR status due to severe aortic valve stenosis. Considering DNR order. - Discuss and document goals of care and DNR status.

## 2023-12-23 NOTE — Assessment & Plan Note (Signed)
 Stable.  Not currently on beta-blocker due to exacerbation of reactive airway disease.

## 2023-12-23 NOTE — Assessment & Plan Note (Signed)
 Well-controlled BP.  Important for afterload reduction. Had previously been on amlodipine  that was discontinued due to fluctuations in BP with occasional very low blood pressures.  Current readings satisfactory. - Continue valsartan HCTZ as prescribed.  (320-12.5 milligram daily) - Monitor blood pressure regularly. - Consider reducing valsartan dose or discontinuing HCTZ if blood pressure decreases further.

## 2023-12-23 NOTE — Assessment & Plan Note (Signed)
 Hyperlipidemia managed with lovastatin. Recent cholesterol levels within acceptable range. Lab Results  Component Value Date   CHOL 150 11/15/2023   HDL 53 11/15/2023   LDLCALC 79 11/15/2023   TRIG 101 11/15/2023   CHOLHDL 2.8 11/15/2023   - Continue lovastatin as prescribed. - Monitor cholesterol levels as per routine schedule.

## 2023-12-24 DIAGNOSIS — M25511 Pain in right shoulder: Secondary | ICD-10-CM | POA: Diagnosis not present

## 2023-12-24 DIAGNOSIS — I350 Nonrheumatic aortic (valve) stenosis: Secondary | ICD-10-CM | POA: Diagnosis not present

## 2023-12-24 DIAGNOSIS — M6281 Muscle weakness (generalized): Secondary | ICD-10-CM | POA: Diagnosis not present

## 2023-12-24 DIAGNOSIS — J449 Chronic obstructive pulmonary disease, unspecified: Secondary | ICD-10-CM | POA: Diagnosis not present

## 2023-12-25 ENCOUNTER — Other Ambulatory Visit: Payer: Self-pay | Admitting: Internal Medicine

## 2023-12-25 DIAGNOSIS — M6281 Muscle weakness (generalized): Secondary | ICD-10-CM | POA: Diagnosis not present

## 2023-12-27 DIAGNOSIS — M6281 Muscle weakness (generalized): Secondary | ICD-10-CM | POA: Diagnosis not present

## 2023-12-28 DIAGNOSIS — I350 Nonrheumatic aortic (valve) stenosis: Secondary | ICD-10-CM | POA: Diagnosis not present

## 2023-12-28 DIAGNOSIS — M25511 Pain in right shoulder: Secondary | ICD-10-CM | POA: Diagnosis not present

## 2023-12-28 DIAGNOSIS — J449 Chronic obstructive pulmonary disease, unspecified: Secondary | ICD-10-CM | POA: Diagnosis not present

## 2023-12-28 DIAGNOSIS — M6281 Muscle weakness (generalized): Secondary | ICD-10-CM | POA: Diagnosis not present

## 2024-01-01 ENCOUNTER — Ambulatory Visit: Payer: Medicare PPO | Admitting: Cardiology

## 2024-01-01 DIAGNOSIS — M25511 Pain in right shoulder: Secondary | ICD-10-CM | POA: Diagnosis not present

## 2024-01-01 DIAGNOSIS — M6281 Muscle weakness (generalized): Secondary | ICD-10-CM | POA: Diagnosis not present

## 2024-01-01 DIAGNOSIS — J449 Chronic obstructive pulmonary disease, unspecified: Secondary | ICD-10-CM | POA: Diagnosis not present

## 2024-01-01 DIAGNOSIS — I350 Nonrheumatic aortic (valve) stenosis: Secondary | ICD-10-CM | POA: Diagnosis not present

## 2024-01-02 DIAGNOSIS — M6281 Muscle weakness (generalized): Secondary | ICD-10-CM | POA: Diagnosis not present

## 2024-01-03 DIAGNOSIS — M25511 Pain in right shoulder: Secondary | ICD-10-CM | POA: Diagnosis not present

## 2024-01-03 DIAGNOSIS — M6281 Muscle weakness (generalized): Secondary | ICD-10-CM | POA: Diagnosis not present

## 2024-01-03 DIAGNOSIS — J449 Chronic obstructive pulmonary disease, unspecified: Secondary | ICD-10-CM | POA: Diagnosis not present

## 2024-01-03 DIAGNOSIS — I350 Nonrheumatic aortic (valve) stenosis: Secondary | ICD-10-CM | POA: Diagnosis not present

## 2024-01-04 DIAGNOSIS — M6281 Muscle weakness (generalized): Secondary | ICD-10-CM | POA: Diagnosis not present

## 2024-01-08 DIAGNOSIS — M6281 Muscle weakness (generalized): Secondary | ICD-10-CM | POA: Diagnosis not present

## 2024-01-08 DIAGNOSIS — M25511 Pain in right shoulder: Secondary | ICD-10-CM | POA: Diagnosis not present

## 2024-01-08 DIAGNOSIS — J449 Chronic obstructive pulmonary disease, unspecified: Secondary | ICD-10-CM | POA: Diagnosis not present

## 2024-01-08 DIAGNOSIS — I350 Nonrheumatic aortic (valve) stenosis: Secondary | ICD-10-CM | POA: Diagnosis not present

## 2024-01-09 DIAGNOSIS — M6281 Muscle weakness (generalized): Secondary | ICD-10-CM | POA: Diagnosis not present

## 2024-01-10 ENCOUNTER — Emergency Department (HOSPITAL_BASED_OUTPATIENT_CLINIC_OR_DEPARTMENT_OTHER): Admitting: Radiology

## 2024-01-10 ENCOUNTER — Encounter (HOSPITAL_BASED_OUTPATIENT_CLINIC_OR_DEPARTMENT_OTHER): Payer: Self-pay | Admitting: Emergency Medicine

## 2024-01-10 ENCOUNTER — Other Ambulatory Visit: Payer: Self-pay

## 2024-01-10 ENCOUNTER — Emergency Department (HOSPITAL_BASED_OUTPATIENT_CLINIC_OR_DEPARTMENT_OTHER)
Admission: EM | Admit: 2024-01-10 | Discharge: 2024-01-10 | Disposition: A | Discharge status: Home / Self Care | Attending: Emergency Medicine | Admitting: Emergency Medicine

## 2024-01-10 DIAGNOSIS — J449 Chronic obstructive pulmonary disease, unspecified: Secondary | ICD-10-CM | POA: Insufficient documentation

## 2024-01-10 DIAGNOSIS — Z79899 Other long term (current) drug therapy: Secondary | ICD-10-CM | POA: Diagnosis not present

## 2024-01-10 DIAGNOSIS — I10 Essential (primary) hypertension: Secondary | ICD-10-CM | POA: Diagnosis not present

## 2024-01-10 DIAGNOSIS — M79671 Pain in right foot: Secondary | ICD-10-CM | POA: Insufficient documentation

## 2024-01-10 DIAGNOSIS — Z7982 Long term (current) use of aspirin: Secondary | ICD-10-CM | POA: Insufficient documentation

## 2024-01-10 DIAGNOSIS — M19071 Primary osteoarthritis, right ankle and foot: Secondary | ICD-10-CM | POA: Diagnosis not present

## 2024-01-10 NOTE — ED Triage Notes (Signed)
 Pt via pov from home with right foot pain after she "rammed it." She was in her new power wheelchair and ran into a door. Pt has swelling to the top and side of the right foot. Pt alert & oriented, nad noted.

## 2024-01-10 NOTE — Discharge Instructions (Signed)
 You were seen today for right foot pain after you hit your foot on the door.  Your x-ray today did not show any acute fractures or bone abnormalities.  I suspect this is most likely bruising after the injury that is causing your pain today.  Recommend you continue to rest, elevate when sitting down, compress the area as needed as well as use ice and Tylenol  for pain relief.  If pain persists despite these measures, recommend you continue to follow-up with [facility] as CT imaging or MRI may be needed at that time.

## 2024-01-10 NOTE — ED Provider Notes (Signed)
 [place] EMERGENCY DEPARTMENT AT [facility] Provider Note   CSN: [id number] Arrival date & time: 01/10/24  1601     History  Chief Complaint  Patient presents with   Foot Pain    Robin [name] is a 89 y.o. female.   Foot Pain  Patient is an 89 year old female presents the ED today with complaints of right foot pain which she reports having "rammed it against the door" when she was attempting to open the door while sitting in her chair.  Notes that the pain has been increasing and is now had difficulty ambulating, limited by pain.  Denies any injuries otherwise.  No other complaints otherwise.  Previous medical history of COPD, HTN, severe aortic stenosis, PVCs, nonrheumatic aortic insufficiency, mitral valve insufficiency, HLD, GERD  Denies weakness, numbness, tingling.     Home Medications Prior to Admission medications   Medication Sig Start Date End Date Taking? Authorizing Provider  acetaminophen  (TYLENOL ) 650 MG CR tablet Take 650 mg by mouth in the morning, at noon, and at bedtime.  Patient not taking: Reported on 12/19/2023    [provider]  albuterol  (VENTOLIN  HFA) 108 (90 Base) MCG/ACT inhaler Inhale 2 puffs into the lungs every 6 (six) hours as needed for wheezing or shortness of breath. 02/20/23   Arleen Lacer, MD  aspirin  81 MG tablet Take 81 mg by mouth daily.    [provider]  lovastatin (MEVACOR) 40 MG tablet TAKE 1 TABLET BY MOUTH EVERY DAY 09/05/23   Marguerite Shiley, MD  MELATONIN PO Take 2 mg by mouth at bedtime as needed. Patient not taking: Reported on 12/19/2023    [provider]  pantoprazole  (PROTONIX ) 20 MG tablet TAKE 1 TABLET BY MOUTH EVERY DAY AS NEEDED 12/25/23   Gupta, Anjali L, MD  Probiotic Product (PROBIOTIC DAILY PO) Take 1 capsule by mouth daily.    [provider]  sertraline  (ZOLOFT ) 100 MG tablet TAKE 1 TABLET BY MOUTH EVERY DAY 12/25/23   Gupta, Anjali L, MD  tiotropium (SPIRIVA   HANDIHALER) 18 MCG inhalation capsule Place 1 capsule (18 mcg total) into inhaler and inhale daily. 06/21/23   Gupta, Anjali L, MD  valsartan-hydrochlorothiazide (DIOVAN-HCT) 320-12.5 MG tablet TAKE 1 TABLET BY MOUTH EVERY DAY 12/03/23   Marguerite Shiley, MD      Allergies    Fosamax [alendronate sodium], Augmentin [amoxicillin-pot clavulanate], Hydrocodone, Mobic [meloxicam], and Sulfonamide derivatives    Review of Systems   Review of Systems  All other systems reviewed and are negative.   Physical Exam Updated Vital Signs BP (!) 133/53 (BP Location: Right Arm)   Pulse 92   Temp 97.9 F (36.6 C)   Resp 18   Ht 5' (1.524 m)   Wt 55.5 kg   SpO2 95%   BMI 23.90 kg/m  Physical Exam Vitals and nursing note reviewed.  Constitutional:      General: She is not in acute distress.    Appearance: Normal appearance. She is not ill-appearing.  HENT:     Head: Normocephalic and atraumatic.  Eyes:     Extraocular Movements: Extraocular movements intact.     Conjunctiva/sclera: Conjunctivae normal.  Cardiovascular:     Rate and Rhythm: Normal rate and regular rhythm.     Pulses: Normal pulses.     Heart sounds: Normal heart sounds. No murmur heard.    No friction rub. No gallop.  Pulmonary:     Effort: Pulmonary effort is normal. No respiratory distress.  Breath sounds: Normal breath sounds.  Abdominal:     General: Abdomen is flat.     Palpations: Abdomen is soft.     Tenderness: There is no abdominal tenderness.  Skin:    General: Skin is warm and dry.     Coloration: Skin is not pale.     Findings: No bruising or erythema.  Neurological:     General: No focal deficit present.     Mental Status: She is alert and oriented to person, place, and time. Mental status is at baseline.     Cranial Nerves: No cranial nerve deficit.     Sensory: No sensory deficit.     Motor: No weakness.     Coordination: Coordination normal.     Gait: Gait normal.  Psychiatric:        Mood and  Affect: Mood normal.     ED Results / Procedures / Treatments   Labs (all labs ordered are listed, but only abnormal results are displayed) Labs Reviewed - No data to display  EKG None  Radiology DG Foot Complete Right Result Date: 01/10/2024 CLINICAL DATA:  Right foot pain. EXAM: RIGHT FOOT COMPLETE - 3+ VIEW COMPARISON:  None Available. FINDINGS: Diffuse osseous demineralization. There is no evidence of acute fracture or dislocation. Calcaneal enthesopathy at the insertion of the Achilles tendon and the origin of the central cord of the plantar fascia. Mild first MTP joint space narrowing and marginal osteophytosis. Mild degenerative changes of the midfoot. Peripheral vascular calcifications are noted. IMPRESSION: 1. No acute osseous abnormality. 2. Mild osteoarthritis of the foot. Electronically Signed   By: Mannie Seek M.D.   On: 01/10/2024 17:17    Procedures Procedures    Medications Ordered in ED Medications - No data to display  ED Course/ Medical Decision Making/ A&P                                 Medical Decision Making Amount and/or Complexity of Data Reviewed Radiology: ordered.   This patient is a 89 year old female who presents to the ED for concern of right lateral foot pain after hitting foot against a door yesterday.  Notes difficulty walking, able to ambulate but with pain.  On physical exam, patient is in no acute distress, afebrile, alert and orient x 4, speaking in full sentences, nontachypneic, nontachycardic.  When evaluating foot, R foot is without any bruising, ecchymosis.  Tenderness noted to palpation over the dorsum of foot, no tenderness to the plantar aspect of foot.  Full ROM of toes and ankle.  Good cap refill and DP pulse 1+.  No tenderness noted over the lateral malleolus or medial malleolus.  No other injuries noted to left leg, right knee, right hip.  Chronic murmurs noted on auscultation.  Exam is otherwise unremarkable  X-ray did not  show any acute fracture or dislocation.  With about signs of bruising or decrease in ROM, low suspicion for ligament injury or neurovascular injury.  Will wrap with Ace wrap as well as have her continue RICE at home, follow-up with [facility] if pain persists.  Patient vital signs have remained stable throughout the course of patient's time in the ED. Low suspicion for any other emergent pathology at this time. I believe this patient is safe to be discharged. Provided strict return to ER precautions. Patient expressed agreement and understanding of plan. All questions were answered.  Differential diagnoses prior to evaluation:  The emergent differential diagnosis includes, but is not limited to, fracture, ligamentous injury, neurovascular injury, dislocation/malalignment. This is not an exhaustive differential.   Past Medical History / Co-morbidities / Social History: COPD, HTN, severe aortic stenosis, PVCs, nonrheumatic aortic insufficiency, mitral valve insufficiency, HLD, GERD  Additional history: Chart reviewed. Pertinent results include: Seen by cardiology on 12/19/2023 and scheduled for a year follow-up with no new changes to regimen.  Lab Tests/Imaging studies: I personally interpreted labs/imaging and the pertinent results include:    X-ray of right foot shows mild osteoarthritis with no acute osseous abnormalities   I agree with the radiologist interpretation.    Medications:  I have reviewed the patients home medicines and have made adjustments as needed.   Disposition: After consideration of the diagnostic results and the patients response to treatment, I feel that the patient would benefit from discharge and treatment as above.   emergency department workup does not suggest an emergent condition requiring admission or immediate intervention beyond what has been performed at this time. The plan is: Symptomatic management at home, RICE, follow-up with Ortho if symptoms persist,  return for any new or worsening symptoms. The patient is safe for discharge and has been instructed to return immediately for worsening symptoms, change in symptoms or any other concerns.   Final Clinical Impression(s) / ED Diagnoses Final diagnoses:  Right foot pain    Rx / DC Orders ED Discharge Orders     None         Hayes Lipps, PA-C 01/10/24 1738    Sallyanne Creamer, DO 01/11/24 1349

## 2024-01-11 DIAGNOSIS — M6281 Muscle weakness (generalized): Secondary | ICD-10-CM | POA: Diagnosis not present

## 2024-01-14 ENCOUNTER — Non-Acute Institutional Stay: Payer: Medicare PPO | Admitting: Orthopedic Surgery

## 2024-01-14 ENCOUNTER — Encounter: Payer: Self-pay | Admitting: Orthopedic Surgery

## 2024-01-14 VITALS — BP 110/52 | HR 85 | Temp 98.0°F | Resp 15 | Ht 63.0 in | Wt 120.0 lb

## 2024-01-14 DIAGNOSIS — Z0000 Encounter for general adult medical examination without abnormal findings: Secondary | ICD-10-CM

## 2024-01-14 NOTE — Progress Notes (Addendum)
 Subjective:   Robin [name] is a 89 y.o. female who presents for Medicare Annual (Subsequent) preventive examination.  Visit Complete: In person Visit Location: [facility]  Patient Medicare AWV questionnaire was completed by the patient on 01/14/2024; I have confirmed that all information answered by patient is correct and no changes since this date.  Cardiac Risk Factors include: advanced age (>84men, >34 women);hypertension;sedentary lifestyle     Objective:     Today's Vitals   01/14/24 1401 01/14/24 1405  BP: (!) 110/52   Pulse: 85   Resp: 15   Temp: 98 F (36.7 C)   SpO2: 94%   Weight: 120 lb (54.4 kg)   Height: 5\' 3"  (1.6 m)   PainSc:  4    Body mass index is 21.26 kg/m.     01/10/2024    4:09 PM 10/24/2023    1:41 PM 10/10/2023    2:14 PM 09/26/2023    2:02 PM 05/23/2023   11:15 AM 01/08/2023    2:29 PM 09/13/2022    2:14 PM  Advanced Directives  Does Patient Have a Medical Advance Directive? No Yes Yes Yes Yes Yes Yes  Type of Furniture conservator/restorer;Living will;Out of facility DNR (pink MOST or yellow form) Healthcare Power of [place];Living will;Out of facility DNR (pink MOST or yellow form) Healthcare Power of [place];Living will;Out of facility DNR (pink MOST or yellow form) Healthcare Power of [place];Living will;Out of facility DNR (pink MOST or yellow form) Healthcare Power of [employer];Living will;Out of facility DNR (pink MOST or yellow form) Healthcare Power of [place];Living will;Out of facility DNR (pink MOST or yellow form)  Does patient want to make changes to medical advance directive?  No - Patient declined No - Patient declined  No - Patient declined No - Patient declined No - Patient declined  Copy of Healthcare Power of Attorney in Chart?  No - copy requested   No - copy requested No - copy requested No - copy requested    Current Medications (verified) Outpatient Encounter Medications as of 01/14/2024   Medication Sig   acetaminophen  (TYLENOL ) 650 MG CR tablet Take 650 mg by mouth in the morning, at noon, and at bedtime.  (Patient not taking: Reported on 12/19/2023)   albuterol  (VENTOLIN  HFA) 108 (90 Base) MCG/ACT inhaler Inhale 2 puffs into the lungs every 6 (six) hours as needed for wheezing or shortness of breath.   aspirin  81 MG tablet Take 81 mg by mouth daily.   lovastatin (MEVACOR) 40 MG tablet TAKE 1 TABLET BY MOUTH EVERY DAY   MELATONIN PO Take 2 mg by mouth at bedtime as needed. (Patient not taking: Reported on 12/19/2023)   pantoprazole  (PROTONIX ) 20 MG tablet TAKE 1 TABLET BY MOUTH EVERY DAY AS NEEDED   Probiotic Product (PROBIOTIC DAILY PO) Take 1 capsule by mouth daily.   sertraline  (ZOLOFT ) 100 MG tablet TAKE 1 TABLET BY MOUTH EVERY DAY   tiotropium (SPIRIVA  HANDIHALER) 18 MCG inhalation capsule Place 1 capsule (18 mcg total) into inhaler and inhale daily.   valsartan-hydrochlorothiazide (DIOVAN-HCT) 320-12.5 MG tablet TAKE 1 TABLET BY MOUTH EVERY DAY   No facility-administered encounter medications on file as of 01/14/2024.    Allergies (verified) Fosamax [alendronate sodium], Augmentin [amoxicillin-pot clavulanate], Hydrocodone, Mobic [meloxicam], and Sulfonamide derivatives   History: Past Medical History:  Diagnosis Date   Arthritis    COPD (chronic obstructive pulmonary disease) (HCC)    Depression    Diverticulosis  GERD (gastroesophageal reflux disease)    Hyperlipemia    Hypertension    Irregular heart beat 2014   PVCs   Moderate to severe aortic insufficiency 07/2019   Echo (03/08/2020): Aortic stenosis is now severe with mean gradient 60 mmHg. Aortic regurgitation is moderate to severe.   Osteoporosis    Severe aortic stenosis 06/11/2019   Echo 06/2019: EF 60-65%. Mild basal septal LVH, GR 2 DD. Mod LA dilation.- High LVEDP. Severe AI, Mod AS-peak gradient 43 mmHg, mean 28 mmHg;; 02/2020: Mean AoV Gradient 60 mmHg with Mod AI. EF 70-755.  Mod LVH. Mod LA  dilation. Mod MR.   Past Surgical History:  Procedure Laterality Date   ABDOMINAL HYSTERECTOMY     both appy and hyst done together   APPENDECTOMY     CATARACT EXTRACTION     TRANSTHORACIC ECHOCARDIOGRAM  09/09/2021   : EF 60 to 65%.  Normal LV size and function.  Mild LVH.  Indeterminate D Fxn-elevated LAP with Mod LA dilation.  Severe MAC with moderate MR/MS (mean gradient 9.1 mmHg).  Moderate AoV calcification with severe thickening-> Mod to Severe AS/AI; no change.   TRANSTHORACIC ECHOCARDIOGRAM  09/06/2020   EF 60-65%.  Normal LV fxn.  Mod Conc LVH of basal septal segment.  Indeterminate filling pattern (likely Gr2DD w/ Mod LA dilation).  Nl RV size and function.  Mildly increased PAP ~ 39.5 mmHg.  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  Mild-Mod TR.  Severe AoV Ca++ -> Mod-Severe AI & AS (mean gradient 33 mmHg) - ? lower b.c lower LVEF?   Family History  Problem Relation Age of Onset   Lung cancer Father    Cancer Sister        biliary   Hypertension Mother    Cancer Sister    Social History   Socioeconomic History   Marital status: Married    Spouse name: Not on file   Number of children: 2   Years of education: Not on file   Highest education level: Not on file  Occupational History   Occupation: retired-schoolteacher    Comment: Teacher  Tobacco Use   Smoking status: Former    Current packs/day: 0.00    Average packs/day: 1 pack/day for 30.0 years (30.0 ttl pk-yrs)    Types: Cigarettes    Start date: 08/28/1978    Quit date: 08/28/2008    Years since quitting: 15.3   Smokeless tobacco: Never  Vaping Use   Vaping status: Never Used  Substance and Sexual Activity   Alcohol use: Not Currently    Comment: Every now and then   Drug use: No   Sexual activity: Not on file  Other Topics Concern   Not on file  Social History Narrative   Currently remarried.  New husband has significant cardiac history.  (Followed by Dr. Addie Holstein)   She is  a retired Runner, broadcasting/film/video.      She and her new husband Robin [name]") moved to [facility] just prior to the outbreak of the pandemic in the Spring of 2020.    Robin [name] - Terie Lear) died January 31, 2020 -> longstanding Parkinson's disease complicated by dementia.      --> Now finally able to work with PT/OT to rehab - chronic back pain with very unsteady gait/poor balance. -- Uses cane for short distance - but mostly uses Walker.  Really only walks to & from the cafeteria if not working with therapy.She does  have a pretty unsteady gait and has to either use a cane for short distances or uses a walker for longer distance. -->This is really because of her bad back and poor balance.  As result of this, she really is not all that active, and is very upset about not being on to do her rehab.   Social Drivers of Corporate investment banker Strain: Low Risk  (01/14/2024)   Overall Financial Resource Strain (CARDIA)    Difficulty of Paying Living Expenses: Not hard at all  Food Insecurity: No Food Insecurity (01/14/2024)   Hunger Vital Sign    Worried About Running Out of Food in the Last Year: Never true    Ran Out of Food in the Last Year: Never true  Transportation Needs: No Transportation Needs (01/14/2024)   PRAPARE - Administrator, Civil Service (Medical): No    Lack of Transportation (Non-Medical): No  Physical Activity: Insufficiently Active (01/14/2024)   Exercise Vital Sign    Days of Exercise per Week: 2 days    Minutes of Exercise per Session: 40 min  Stress: No Stress Concern Present (01/14/2024)   [employer] of Occupational Health - Occupational Stress Questionnaire    Feeling of Stress : Only a little  Social Connections: Socially Isolated (01/14/2024)   Social Connection and Isolation Panel [NHANES]    Frequency of Communication with Friends and Family: More than three times a week    Frequency of Social Gatherings with Friends and Family: More than three times a week    Attends  Religious Services: Never    Database administrator or Organizations: No    Attends Banker Meetings: Never    Marital Status: Widowed    Tobacco Counseling Counseling given: Not Answered   Clinical Intake:  Pre-visit preparation completed: Yes  Pain : No/denies pain Pain Score: 4  Pain Type: Acute pain Pain Location: Foot Pain Orientation: Right     BMI - recorded: 21.26 Nutritional Status: BMI of 19-24  Normal Nutritional Risks: None Diabetes: No  How often do you need to have someone help you when you read instructions, pamphlets, or other written materials from your doctor or pharmacy?: 1 - Never What is the last grade level you completed in school?: College  Interpreter Needed?: No      Activities of Daily Living    01/14/2024    2:09 PM  In your present state of health, do you have any difficulty performing the following activities:  Hearing? 0  Vision? 0  Difficulty concentrating or making decisions? 0  Walking or climbing stairs? 1  Dressing or bathing? 0  Doing errands, shopping? 1  Preparing Food and eating ? Y  Using the Toilet? Y  In the past six months, have you accidently leaked urine? Y  Do you have problems with loss of bowel control? N  Managing your Medications? N  Managing your Finances? Y  Housekeeping or managing your Housekeeping? Y    Patient Care Team: Marguerite Shiley, MD as PCP - General (Internal Medicine) Arleen Lacer, MD as PCP - Cardiology (Cardiology)  Indicate any recent Medical Services you may have received from other than [facility] providers in the past year (date may be approximate).     Assessment:    This is a routine wellness examination for [place].  Hearing/Vision screen No results found.   Goals Addressed             This  Visit's Progress    Maintain Mobility and Function   On track    Evidence-based guidance:  Emphasize the importance of physical activity and aerobic exercise as included  in treatment plan; assess barriers to adherence; consider patient's abilities and preferences.  Encourage gradual increase in activity or exercise instead of stopping if pain occurs.  Reinforce individual therapy exercise prescription, such as strengthening, stabilization and stretching programs.  Promote optimal body mechanics to stabilize the spine with lifting and functional activity.  Encourage activity and mobility modifications to facilitate optimal function, such as using a log roll for bed mobility or dressing from a seated position.  Reinforce individual adaptive equipment recommendations to limit excessive spinal movements, such as a Event organiser.  Assess adequacy of sleep; encourage use of sleep hygiene techniques, such as bedtime routine; use of white noise; dark, cool bedroom; avoiding daytime naps, heavy meals or exercise before bedtime.  Promote positions and modification to optimize sleep and sexual activity; consider pillows or positioning devices to assist in maintaining neutral spine.  Explore options for applying ergonomic principles at work and home, such as frequent position changes, using ergonomically designed equipment and working at optimal height.  Promote modifications to increase comfort with driving such as lumbar support, optimizing seat and steering wheel position, using cruise control and taking frequent rest stops to stretch and walk.   Notes:      Patient Stated   On track    In 09/23/20 goal is that she would like to have more energy        Depression Screen    01/14/2024    2:13 PM 11/21/2023    2:26 PM 05/23/2023   11:14 AM 01/08/2023    2:48 PM 01/08/2023    2:28 PM 01/02/2022    2:28 PM 09/23/2020    3:51 PM  PHQ 2/9 Scores  PHQ - 2 Score 0 0 0 0 0 0 0  PHQ- 9 Score   0      Exception Documentation       --    Fall Risk    01/14/2024    2:14 PM 11/21/2023    2:25 PM 10/24/2023    1:41 PM 10/10/2023    2:14 PM 09/26/2023    2:01 PM  Fall Risk    Falls in the past year? 1 1 0 0 0  Number falls in past yr: 0 1 0 0 0  Injury with Fall? 1 1 0 0 0  Risk for fall due to : History of fall(s);Impaired balance/gait Impaired balance/gait;Impaired mobility No Fall Risks Impaired balance/gait;Impaired mobility Impaired balance/gait;Impaired mobility  Follow up Falls evaluation completed Falls evaluation completed Falls evaluation completed;Education provided;Falls prevention discussed Falls evaluation completed Falls evaluation completed    MEDICARE RISK AT HOME: Medicare Risk at Home Any stairs in or around the home?: No If so, are there any without handrails?: No Home free of loose throw rugs in walkways, pet beds, electrical cords, etc?: Yes Adequate lighting in your home to reduce risk of falls?: Yes Life alert?: Yes Use of a cane, walker or w/c?: Yes Grab bars in the bathroom?: Yes Shower chair or bench in shower?: Yes Elevated toilet seat or a handicapped toilet?: Yes  TIMED UP AND GO:  Was the test performed?  No    Cognitive Function:    01/08/2023    2:29 PM 01/02/2022    2:28 PM  MMSE - Mini Mental State Exam  Not completed:  --  Orientation  to time 4 5  Orientation to Place 5 5  Registration 3 3  Attention/ Calculation 5 5  Recall 3 3  Language- name 2 objects 2 2  Language- repeat 1 1  Language- follow 3 step command 3 2  Language- read & follow direction 1 1  Write a sentence 1 1  Copy design 1 0  Total score 29 28        01/14/2024    2:14 PM 09/23/2020    3:55 PM  6CIT Screen  What Year? 0 points 0 points  What month? 0 points 0 points  What time? 0 points 0 points  Count back from 20 0 points 0 points  Months in reverse 0 points 0 points  Repeat phrase 2 points 2 points  Total Score 2 points 2 points    Immunizations Immunization History  Administered Date(s) Administered   Fluad Quad(high Dose 65+) 04/28/2022, 06/15/2023   Influenza, High Dose Seasonal PF 06/11/2019   Influenza-Unspecified  05/18/2020, 06/24/2021   Moderna SARS-COV2 Booster Vaccination 02/02/2021   Moderna Sars-Covid-2 Vaccination 09/01/2019, 09/29/2019, 07/12/2020   Pfizer Covid-19 Vaccine Bivalent Booster 61yrs & up 06/24/2021   Pneumococcal Conjugate-13 09/29/2022   Pneumococcal Polysaccharide-23 11/07/2013   Respiratory Syncytial Virus Vaccine,Recomb Aduvanted(Arexvy) 08/25/2022   Tdap 11/05/2012    TDAP status: Due, Education has been provided regarding the importance of this vaccine. Advised may receive this vaccine at local pharmacy or Health Dept. Aware to provide a copy of the vaccination record if obtained from local pharmacy or Health Dept. Verbalized acceptance and understanding.  Flu Vaccine status: Up to date  Pneumococcal vaccine status: Up to date  Covid-19 vaccine status: Completed vaccines  Qualifies for Shingles Vaccine? Yes   Zostavax completed No   Shingrix Completed?: No.    Education has been provided regarding the importance of this vaccine. Patient has been advised to call insurance company to determine out of pocket expense if they have not yet received this vaccine. Advised may also receive vaccine at local pharmacy or Health Dept. Verbalized acceptance and understanding.  Screening Tests Health Maintenance  Topic Date Due   DTaP/Tdap/Td (2 - Td or Tdap) 11/06/2022   COVID-19 Vaccine (6 - 2024-25 season) 04/29/2023   INFLUENZA VACCINE  03/28/2024   Medicare Annual Wellness (AWV)  01/13/2025   Pneumonia Vaccine 5+ Years old  Completed   DEXA SCAN  Completed   HPV VACCINES  Aged Out   Meningococcal B Vaccine  Aged Out   Zoster Vaccines- Shingrix  Discontinued    Health Maintenance  Health Maintenance Due  Topic Date Due   DTaP/Tdap/Td (2 - Td or Tdap) 11/06/2022   COVID-19 Vaccine (6 - 2024-25 season) 04/29/2023    Colorectal cancer screening: No longer required.   Mammogram status: No longer required due to advanced age.  Bone Density status: Completed 07/2021.  Results reflect: Bone density results: OSTEOPOROSIS. Repeat every 2022 years.  Lung Cancer Screening: (Low Dose CT Chest recommended if Age 37-80 years, 20 pack-year currently smoking OR have quit w/in 15years.) does not qualify.   Lung Cancer Screening Referral: No  Additional Screening:  Hepatitis C Screening: does not qualify; Completed   Vision Screening: Recommended annual ophthalmology exams for early detection of glaucoma and other disorders of the eye. Is the patient up to date with their annual eye exam?  No  Who is the provider or what is the name of the office in which the patient attends annual eye exams? Dr. Annabell Key If  pt is not established with a provider, would they like to be referred to a provider to establish care? No .   Dental Screening: Recommended annual dental exams for proper oral hygiene  Diabetic Foot Exam: Diabetic Foot Exam: Completed 01/14/2024  Community Resource Referral / Chronic Care Management: CRR required this visit?  No   CCM required this visit?  No     Plan:     I have personally reviewed and noted the following in the patient's chart:   Medical and social history Use of alcohol, tobacco or illicit drugs  Current medications and supplements including opioid prescriptions. Patient is not currently taking opioid prescriptions. Functional ability and status Nutritional status Physical activity Advanced directives List of other physicians Hospitalizations, surgeries, and ER visits in previous 12 months Vitals Screenings to include cognitive, depression, and falls Referrals and appointments  In addition, I have reviewed and discussed with patient certain preventive protocols, quality metrics, and best practice recommendations. A written personalized care plan for preventive services as well as general preventive health recommendations were provided to patient.     Arnetha Bhat, NP   01/14/2024   After Visit Summary: (MyChart) Due to  this being a telephonic visit, the after visit summary with patients personalized plan was offered to patient via MyChart   Nurse Notes: 6CIT score 2, h/o osteoporosis>discussed calcium supplement> off Prolia> does not want to restart> will consider caltrate, ambulates well with PWC, lives in independent living. Refusing Tdap or Shingles vaccines.

## 2024-01-14 NOTE — Patient Instructions (Signed)
  Ms. Oshea , Thank you for taking time to come for your Medicare Wellness Visit. I appreciate your ongoing commitment to your health goals. Please review the following plan we discussed and let me know if I can assist you in the future.   These are the goals we discussed:  Goals      Maintain Mobility and Function     Evidence-based guidance:  Emphasize the importance of physical activity and aerobic exercise as included in treatment plan; assess barriers to adherence; consider patient's abilities and preferences.  Encourage gradual increase in activity or exercise instead of stopping if pain occurs.  Reinforce individual therapy exercise prescription, such as strengthening, stabilization and stretching programs.  Promote optimal body mechanics to stabilize the spine with lifting and functional activity.  Encourage activity and mobility modifications to facilitate optimal function, such as using a log roll for bed mobility or dressing from a seated position.  Reinforce individual adaptive equipment recommendations to limit excessive spinal movements, such as a Event organiser.  Assess adequacy of sleep; encourage use of sleep hygiene techniques, such as bedtime routine; use of white noise; dark, cool bedroom; avoiding daytime naps, heavy meals or exercise before bedtime.  Promote positions and modification to optimize sleep and sexual activity; consider pillows or positioning devices to assist in maintaining neutral spine.  Explore options for applying ergonomic principles at work and home, such as frequent position changes, using ergonomically designed equipment and working at optimal height.  Promote modifications to increase comfort with driving such as lumbar support, optimizing seat and steering wheel position, using cruise control and taking frequent rest stops to stretch and walk.   Notes:      Patient Stated     In 09/23/20 goal is that she would like to have more energy          This is a list of the screening recommended for you and due dates:  Health Maintenance  Topic Date Due   COVID-19 Vaccine (6 - 2024-25 season) 01/30/2024*   DTaP/Tdap/Td vaccine (2 - Td or Tdap) 01/13/2025*   Flu Shot  03/28/2024   Medicare Annual Wellness Visit  01/13/2025   Pneumonia Vaccine  Completed   DEXA scan (bone density measurement)  Completed   HPV Vaccine  Aged Out   Meningitis B Vaccine  Aged Out   Zoster (Shingles) Vaccine  Discontinued  *Topic was postponed. The date shown is not the original due date.  [facility] is open Monday, Tuesday, Wednesday and Friday.   Monday and Wednesday > [facility] Tuesday and Friday> [place] [facility]  Call [facility] to schedule clinic visits> [phone]

## 2024-01-16 DIAGNOSIS — M6281 Muscle weakness (generalized): Secondary | ICD-10-CM | POA: Diagnosis not present

## 2024-01-18 DIAGNOSIS — M6281 Muscle weakness (generalized): Secondary | ICD-10-CM | POA: Diagnosis not present

## 2024-01-23 DIAGNOSIS — M6281 Muscle weakness (generalized): Secondary | ICD-10-CM | POA: Diagnosis not present

## 2024-01-25 DIAGNOSIS — M6281 Muscle weakness (generalized): Secondary | ICD-10-CM | POA: Diagnosis not present

## 2024-02-20 ENCOUNTER — Encounter: Admitting: Internal Medicine

## 2024-02-20 ENCOUNTER — Encounter: Payer: Self-pay | Admitting: Internal Medicine

## 2024-02-20 ENCOUNTER — Ambulatory Visit: Admitting: Internal Medicine

## 2024-02-20 VITALS — BP 127/76 | HR 80 | Temp 98.2°F | Resp 18 | Ht 63.0 in | Wt 119.1 lb

## 2024-02-20 DIAGNOSIS — G8929 Other chronic pain: Secondary | ICD-10-CM

## 2024-02-20 DIAGNOSIS — J410 Simple chronic bronchitis: Secondary | ICD-10-CM

## 2024-02-20 DIAGNOSIS — I10 Essential (primary) hypertension: Secondary | ICD-10-CM | POA: Diagnosis not present

## 2024-02-20 DIAGNOSIS — E7849 Other hyperlipidemia: Secondary | ICD-10-CM

## 2024-02-20 DIAGNOSIS — I351 Nonrheumatic aortic (valve) insufficiency: Secondary | ICD-10-CM

## 2024-02-20 DIAGNOSIS — K219 Gastro-esophageal reflux disease without esophagitis: Secondary | ICD-10-CM | POA: Diagnosis not present

## 2024-02-20 DIAGNOSIS — M545 Low back pain, unspecified: Secondary | ICD-10-CM | POA: Diagnosis not present

## 2024-02-20 DIAGNOSIS — D649 Anemia, unspecified: Secondary | ICD-10-CM

## 2024-02-20 DIAGNOSIS — F339 Major depressive disorder, recurrent, unspecified: Secondary | ICD-10-CM | POA: Diagnosis not present

## 2024-02-20 NOTE — Progress Notes (Signed)
 Location:   [facility] [employer] of Service:   Clinic  Provider:   Code Status: DNR Goals of Care:     01/10/2024    4:09 PM  Advanced Directives  Does Patient Have a Medical Advance Directive? No     Chief Complaint  Patient presents with   Medical Management of Chronic Issues    Three Months with labs    HPI: Patient is a 89 y.o. female seen today for medical management of chronic diseases.   Lives [place] in [facility]  Has h/o Severe AS with AR  Depression and anxiety Chronic back pain  Alternating diarrhea and constipation questionable IBS.  Has seen GI. COPD HLD, hypertension  Discussed the use of AI scribe software for clinical note transcription with the patient, who gave verbal consent to proceed.  History of Present Illness   Robin [name] is an 89 year old female who presents for follow-up regarding her mobility and constipation issues.  She injured her foot by slamming it into the door while maneuvering her new wheelchair, ED visit did not find any fracture She experiences ongoing constipation, sometimes requiring prolonged sitting to have a bowel movement. Senna is effective in managing this. She has no current shortness of breath or leg swelling.  She takes Tylenol  for arthritis pain, Protonix  as needed for gastrointestinal issues, Spiriva  for respiratory issues, and Zoloft  for mood stabilization. She experiences shortness of breath, particularly in the mornings, but notes improvement with her current regimen. Her dietary intake is stable, though she finds the food options repetitive and sometimes unsatisfactory at her residence.  She wears hearing aids without issues  Past Medical History:  Diagnosis Date   Arthritis    COPD (chronic obstructive pulmonary disease) (HCC)    Depression    Diverticulosis    GERD (gastroesophageal reflux disease)    Hyperlipemia    Hypertension    Irregular heart beat 2014   PVCs   Moderate to severe aortic insufficiency  07/2019   Echo (03/08/2020): Aortic stenosis is now severe with mean gradient 60 mmHg. Aortic regurgitation is moderate to severe.   Osteoporosis    Severe aortic stenosis 06/11/2019   Echo 06/2019: EF 60-65%. Mild basal septal LVH, GR 2 DD. Mod LA dilation.- High LVEDP. Severe AI, Mod AS-peak gradient 43 mmHg, mean 28 mmHg;; 02/2020: Mean AoV Gradient 60 mmHg with Mod AI. EF 70-755.  Mod LVH. Mod LA dilation. Mod MR.    Past Surgical History:  Procedure Laterality Date   ABDOMINAL HYSTERECTOMY     both appy and hyst done together   APPENDECTOMY     CATARACT EXTRACTION     TRANSTHORACIC ECHOCARDIOGRAM  09/09/2021   : EF 60 to 65%.  Normal LV size and function.  Mild LVH.  Indeterminate D Fxn-elevated LAP with Mod LA dilation.  Severe MAC with moderate MR/MS (mean gradient 9.1 mmHg).  Moderate AoV calcification with severe thickening-> Mod to Severe AS/AI; no change.   TRANSTHORACIC ECHOCARDIOGRAM  09/06/2020   EF 60-65%.  Normal LV fxn.  Mod Conc LVH of basal septal segment.  Indeterminate filling pattern (likely Gr2DD w/ Mod LA dilation).  Nl RV size and function.  Mildly increased PAP ~ 39.5 mmHg.  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  Mild-Mod TR.  Severe AoV Ca++ -> Mod-Severe AI & AS (mean gradient 33 mmHg) - ? lower b.c lower LVEF?    Allergies  Allergen Reactions  Fosamax [Alendronate Sodium]     Poor healing to mouth wound   Augmentin [Amoxicillin-Pot Clavulanate] Nausea Only    DID THE REACTION INVOLVE: Swelling of the face/tongue/throat, SOB, or low BP? No Sudden or severe rash/hives, skin peeling, or the inside of the mouth or nose? No Did it require medical treatment? No When did it last happen?       If all above answers are "NO", may proceed with cephalosporin use.    Hydrocodone Nausea And Vomiting   Mobic [Meloxicam] Rash   Sulfonamide Derivatives Rash    Outpatient Encounter Medications as of 02/20/2024  Medication Sig    acetaminophen  (TYLENOL ) 650 MG CR tablet Take 650 mg by mouth in the morning, at noon, and at bedtime.    albuterol  (VENTOLIN  HFA) 108 (90 Base) MCG/ACT inhaler Inhale 2 puffs into the lungs every 6 (six) hours as needed for wheezing or shortness of breath.   aspirin  81 MG tablet Take 81 mg by mouth daily.   lovastatin (MEVACOR) 40 MG tablet TAKE 1 TABLET BY MOUTH EVERY DAY   pantoprazole  (PROTONIX ) 20 MG tablet TAKE 1 TABLET BY MOUTH EVERY DAY AS NEEDED   Probiotic Product (PROBIOTIC DAILY PO) Take 1 capsule by mouth daily.   sertraline  (ZOLOFT ) 100 MG tablet TAKE 1 TABLET BY MOUTH EVERY DAY   tiotropium (SPIRIVA  HANDIHALER) 18 MCG inhalation capsule Place 1 capsule (18 mcg total) into inhaler and inhale daily.   valsartan-hydrochlorothiazide (DIOVAN-HCT) 320-12.5 MG tablet TAKE 1 TABLET BY MOUTH EVERY DAY   No facility-administered encounter medications on file as of 02/20/2024.    Review of Systems:  Review of Systems  Constitutional:  Negative for activity change and appetite change.  HENT: Negative.    Respiratory:  Positive for shortness of breath. Negative for cough.   Cardiovascular:  Negative for leg swelling.  Gastrointestinal:  Positive for constipation.  Genitourinary: Negative.   Musculoskeletal:  Positive for gait problem. Negative for arthralgias and myalgias.  Skin: Negative.   Neurological:  Negative for dizziness and weakness.  Psychiatric/Behavioral:  Negative for confusion, dysphoric mood and sleep disturbance.     Health Maintenance  Topic Date Due   COVID-19 Vaccine (6 - 2024-25 season) 03/28/2024 (Originally 04/29/2023)   DTaP/Tdap/Td (2 - Td or Tdap) 01/13/2025 (Originally 11/06/2022)   INFLUENZA VACCINE  03/28/2024   Medicare Annual Wellness (AWV)  01/13/2025   Pneumococcal Vaccine: 50+ Years  Completed   DEXA SCAN  Completed   Hepatitis B Vaccines  Aged Out   HPV VACCINES  Aged Out   Meningococcal B Vaccine  Aged Out   Zoster Vaccines- Shingrix   Discontinued    Physical Exam: Vitals:   02/20/24 1039  BP: 127/76  Pulse: 80  Resp: 18  Temp: 98.2 F (36.8 C)  SpO2: 94%  Weight: 119 lb 1.6 oz (54 kg)  Height: 5' 3 (1.6 m)   Body mass index is 21.1 kg/m. Physical Exam Vitals reviewed.  Constitutional:      Appearance: Normal appearance.  HENT:     Head: Normocephalic.     Nose: Nose normal.     Mouth/Throat:     Mouth: Mucous membranes are moist.     Pharynx: Oropharynx is clear.   Eyes:     Pupils: Pupils are equal, round, and reactive to light.    Cardiovascular:     Rate and Rhythm: Normal rate and regular rhythm.     Pulses: Normal pulses.     Heart sounds: Murmur heard.  Pulmonary:     Effort: Pulmonary effort is normal.     Breath sounds: Normal breath sounds.  Abdominal:     General: Abdomen is flat. Bowel sounds are normal.     Palpations: Abdomen is soft.   Musculoskeletal:        General: No swelling.     Cervical back: Neck supple.   Skin:    General: Skin is warm.   Neurological:     General: No focal deficit present.     Mental Status: She is alert and oriented to person, place, and time.   Psychiatric:        Mood and Affect: Mood normal.        Thought Content: Thought content normal.     Labs reviewed: Basic Metabolic Panel: Recent Labs    05/17/23 0820 09/27/23 0830 10/24/23 1358 11/15/23 0850  NA 135 137 136 138  K 4.3 3.0* 3.6 3.8  CL 98 98 97* 100  CO2 27 30 30 28   GLUCOSE 86 99 98 101*  BUN 29* 19 17 17   CREATININE 1.00* 0.87 0.83 0.90  CALCIUM 9.4 8.7 8.9 8.7  TSH 1.50  --   --  1.83   Liver Function Tests: Recent Labs    05/17/23 0820 09/27/23 0830 11/15/23 0850  AST 17 18 17   ALT 9 13 10   BILITOT 0.5 0.7 0.6  PROT 6.8 6.2 6.5   Recent Labs    09/27/23 0830  LIPASE 20   No results for input(s): AMMONIA in the last 8760 hours. CBC: Recent Labs    05/17/23 0820 09/27/23 0830 11/15/23 0850  WBC 5.5 4.0 5.3  NEUTROABS 3,476 2,436 3,413   HGB 11.8 11.9 10.6*  HCT 35.7 34.9* 31.8*  MCV 90.8 88.6 88.1  PLT 213 193 225   Lipid Panel: Recent Labs    05/17/23 0820 11/15/23 0850  CHOL 177 150  HDL 73 53  LDLCALC 87 79  TRIG 77 101  CHOLHDL 2.4 2.8   No results found for: HGBA1C  Procedures since last visit: No results found.  Assessment/Plan 1. Severe aortic insufficiency and AS (Primary) Decided against AVR Staying stable Per cardiology After load reduction  2. Gastroesophageal reflux disease, unspecified whether esophagitis present Protonix  PRN  3. Essential hypertension Divan and HCTZ - TSH - COMPLETE METABOLIC PANEL WITHOUT GFR  4. Simple chronic bronchitis (HCC) Spiriva   5. Chronic bilateral low back pain without sciatica Tylenol   6. Depression, recurrent (HCC) Zoloft   7. Other hyperlipidemia statin - Lipid panel - COMPLETE METABOLIC PANEL WITHOUT GFR  8. Anemia, unspecified type After her Acute Gastroenteritis - CBC with Differential/Platelet    Labs/tests ordered:  * No order type specified * Next appt:  Visit date not found

## 2024-02-20 NOTE — Patient Instructions (Addendum)
 Labs will be done on December 15 at [facility] at 7:45 am

## 2024-02-28 ENCOUNTER — Other Ambulatory Visit: Payer: Self-pay | Admitting: Internal Medicine

## 2024-02-28 NOTE — Telephone Encounter (Signed)
 Rx amlodipine  was discontinued back in Jan of this year.

## 2024-03-03 NOTE — Addendum Note (Signed)
 Addended by: Jamez Ambrocio LALA on: 03/03/2024 08:25 PM   Modules accepted: Orders

## 2024-05-09 ENCOUNTER — Encounter: Payer: Self-pay | Admitting: *Deleted

## 2024-05-09 ENCOUNTER — Ambulatory Visit
Admission: EM | Admit: 2024-05-09 | Discharge: 2024-05-09 | Disposition: A | Discharge status: Home / Self Care | Attending: Family Medicine | Admitting: Family Medicine

## 2024-05-09 ENCOUNTER — Other Ambulatory Visit: Payer: Self-pay

## 2024-05-09 ENCOUNTER — Ambulatory Visit (INDEPENDENT_AMBULATORY_CARE_PROVIDER_SITE_OTHER)

## 2024-05-09 ENCOUNTER — Ambulatory Visit: Payer: Self-pay

## 2024-05-09 DIAGNOSIS — S2242XA Multiple fractures of ribs, left side, initial encounter for closed fracture: Secondary | ICD-10-CM | POA: Diagnosis not present

## 2024-05-09 DIAGNOSIS — R0789 Other chest pain: Secondary | ICD-10-CM | POA: Diagnosis not present

## 2024-05-09 MED ORDER — ACETAMINOPHEN 325 MG PO TABS: 650.0000 mg | ORAL_TABLET | Freq: Four times a day (QID) | ORAL | 0 refills | Status: AC | PRN

## 2024-05-09 MED ORDER — METHOCARBAMOL 500 MG PO TABS: 500.0000 mg | ORAL_TABLET | Freq: Every evening | ORAL | 0 refills | Status: DC | PRN

## 2024-05-09 NOTE — Telephone Encounter (Signed)
 FYI Only or Action Required?: FYI only for provider.  Patient was last seen in primary care on 02/20/2024 by Charlanne Fredia CROME, MD.  Called Nurse Triage reporting Back Pain.  Symptoms began several days ago.  Interventions attempted: Tramadol  .  Symptoms are: unchanged.  Triage Disposition: See HCP Within 4 Hours (Or PCP Triage)  Patient/caregiver understands and will follow disposition?: Yes  **Patient agrees to be seen in UC/ED as there is no appt. Availability for today 9/12**         Copied from CRM #[id number]. Topic: Clinical - Red Word Triage >> May 09, 2024 12:42 PM Carrielelia G wrote: [facility] that prompted transfer to Nurse Triage: severe back pain, difficulty walking Reason for Disposition  [1] SEVERE back pain (e.g., excruciating, unable to do any normal activities) AND [2] not improved 2 hours after pain medicine  Answer Assessment - Initial Assessment Questions 1. ONSET: When did the pain begin? (e.g., minutes, hours, days)      Last Wednesday night  2. LOCATION: Where does it hurt? (upper, mid or lower back)      Mid back   3. SEVERITY: How bad is the pain?  (e.g., Scale 1-10; mild, moderate, or severe)     8/10  4. PATTERN: Is the pain constant? (e.g., yes, no; constant, intermittent)      Intermittent, when she twists her body the pain returns  5. RADIATION: Does the pain shoot into your legs or somewhere else?     No   6. CAUSE:  What do you think is causing the back pain?      Pt. Reports falling last Wednesday. She fell across her Lazy boy chair as she was reaching for an object.   7. BACK OVERUSE:  Any recent lifting of heavy objects, strenuous work or exercise?     No   8. MEDICINES: What have you taken so far for the pain? (e.g., nothing, acetaminophen , NSAIDS)      Tramadol  (previous prescription)   9. NEUROLOGIC SYMPTOMS: Do you have any weakness, numbness, or problems with bowel/bladder control?     No   10. OTHER  SYMPTOMS: Do you have any other symptoms? (e.g., fever, abdomen pain, burning with urination, blood in urine)  No   Patient referred to UC/ED as there are no available appointments, she agrees with plan of care and will go seek care now, as she has transportation ready to take her.  Protocols used: Back Pain-A-AH

## 2024-05-09 NOTE — ED Provider Notes (Signed)
 [facility]  Note:  This document was prepared using Conservation officer, historic buildings and may include unintentional dictation errors.  MRN: [id number] DOB: 01-Jun-1932  Subjective:   Robin [name] is a 89 y.o. female presenting for 2-day history of persistent left chest wall pain.  Patient accidentally fell by tripping landing to the armrest of her Lazy-boy chair.  No head injury, loss conscious, confusion, weakness, swelling, open wounds.  Patient has tramadol  that she could use for pain.  No current facility-administered medications for this encounter.  Current Outpatient Medications:    acetaminophen  (TYLENOL ) 650 MG CR tablet, Take 650 mg by mouth in the morning, at noon, and at bedtime. , Disp: , Rfl:    albuterol  (VENTOLIN  HFA) 108 (90 Base) MCG/ACT inhaler, Inhale 2 puffs into the lungs every 6 (six) hours as needed for wheezing or shortness of breath., Disp: 8 g, Rfl: 3   aspirin  81 MG tablet, Take 81 mg by mouth daily., Disp: , Rfl:    lovastatin (MEVACOR) 40 MG tablet, TAKE 1 TABLET BY MOUTH EVERY DAY, Disp: 90 tablet, Rfl: 1   pantoprazole  (PROTONIX ) 20 MG tablet, TAKE 1 TABLET BY MOUTH EVERY DAY AS NEEDED, Disp: 90 tablet, Rfl: 1   Probiotic Product (PROBIOTIC DAILY PO), Take 1 capsule by mouth daily., Disp: , Rfl:    sertraline  (ZOLOFT ) 100 MG tablet, TAKE 1 TABLET BY MOUTH EVERY DAY, Disp: 90 tablet, Rfl: 1   tiotropium (SPIRIVA  HANDIHALER) 18 MCG inhalation capsule, Place 1 capsule (18 mcg total) into inhaler and inhale daily., Disp: 90 capsule, Rfl: 3   valsartan-hydrochlorothiazide (DIOVAN-HCT) 320-12.5 MG tablet, TAKE 1 TABLET BY MOUTH EVERY DAY, Disp: 90 tablet, Rfl: 1   Allergies  Allergen Reactions   Fosamax [Alendronate Sodium]     Poor healing to mouth wound   Augmentin [Amoxicillin-Pot Clavulanate] Nausea Only    DID THE REACTION INVOLVE: Swelling of the face/tongue/throat, SOB, or low BP? No Sudden or severe rash/hives, skin peeling,  or the inside of the mouth or nose? No Did it require medical treatment? No When did it last happen?       If all above answers are "NO", may proceed with cephalosporin use.    Hydrocodone Nausea And Vomiting   Mobic [Meloxicam] Rash   Sulfonamide Derivatives Rash    Past Medical History:  Diagnosis Date   Arthritis    COPD (chronic obstructive pulmonary disease) (HCC)    Depression    Diverticulosis    GERD (gastroesophageal reflux disease)    Hyperlipemia    Hypertension    Irregular heart beat 2014   PVCs   Moderate to severe aortic insufficiency 07/2019   Echo (03/08/2020): Aortic stenosis is now severe with mean gradient 60 mmHg. Aortic regurgitation is moderate to severe.   Osteoporosis    Severe aortic stenosis 06/11/2019   Echo 06/2019: EF 60-65%. Mild basal septal LVH, GR 2 DD. Mod LA dilation.- High LVEDP. Severe AI, Mod AS-peak gradient 43 mmHg, mean 28 mmHg;; 02/2020: Mean AoV Gradient 60 mmHg with Mod AI. EF 70-755.  Mod LVH. Mod LA dilation. Mod MR.     Past Surgical History:  Procedure Laterality Date   ABDOMINAL HYSTERECTOMY     both appy and hyst done together   APPENDECTOMY     CATARACT EXTRACTION     TRANSTHORACIC ECHOCARDIOGRAM  09/09/2021   : EF 60 to 65%.  Normal LV size and function.  Mild LVH.  Indeterminate D Fxn-elevated LAP  with Mod LA dilation.  Severe MAC with moderate MR/MS (mean gradient 9.1 mmHg).  Moderate AoV calcification with severe thickening-> Mod to Severe AS/AI; no change.   TRANSTHORACIC ECHOCARDIOGRAM  09/06/2020   EF 60-65%.  Normal LV fxn.  Mod Conc LVH of basal septal segment.  Indeterminate filling pattern (likely Gr2DD w/ Mod LA dilation).  Nl RV size and function.  Mildly increased PAP ~ 39.5 mmHg.  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  Mild-Mod TR.  Severe AoV Ca++ -> Mod-Severe AI & AS (mean gradient 33 mmHg) - ? lower b.c lower LVEF?    Family History  Problem Relation Age of Onset   Lung  cancer Father    Cancer Sister        biliary   Hypertension Mother    Cancer Sister     Social History   Tobacco Use   Smoking status: Former    Current packs/day: 0.00    Average packs/day: 1 pack/day for 30.0 years (30.0 ttl pk-yrs)    Types: Cigarettes    Start date: 08/28/1978    Quit date: 08/28/2008    Years since quitting: 15.7   Smokeless tobacco: Never  Vaping Use   Vaping status: Never Used  Substance Use Topics   Alcohol use: Not Currently    Comment: Every now and then   Drug use: No    ROS   Objective:   Vitals: BP 122/70   Pulse 83   Temp 98 F (36.7 C)   Resp 20   SpO2 92%   Physical Exam Constitutional:      General: She is not in acute distress.    Appearance: Normal appearance. She is well-developed. She is not ill-appearing, toxic-appearing or diaphoretic.  HENT:     Head: Normocephalic and atraumatic.     Nose: Nose normal.     Mouth/Throat:     Mouth: Mucous membranes are moist.  Eyes:     General: No scleral icterus.       Right eye: No discharge.        Left eye: No discharge.     Extraocular Movements: Extraocular movements intact.  Cardiovascular:     Rate and Rhythm: Normal rate and regular rhythm.     Heart sounds: Normal heart sounds. No murmur heard.    No friction rub. No gallop.  Pulmonary:     Effort: Pulmonary effort is normal. No respiratory distress.     Breath sounds: No stridor. No wheezing, rhonchi or rales.  Chest:     Chest wall: Tenderness (lateral chest wall of left side at mid torso) present.  Skin:    General: Skin is warm and dry.  Neurological:     General: No focal deficit present.     Mental Status: She is alert and oriented to person, place, and time.  Psychiatric:        Mood and Affect: Mood normal.        Behavior: Behavior normal.    DG Ribs Unilateral W/Chest Left Result Date: 05/09/2024 CLINICAL DATA:  left chest wall pain. EXAM: LEFT RIBS AND CHEST - 3+ VIEW COMPARISON:  09/04/2018. FINDINGS:  Bilateral lung fields are clear. Bilateral costophrenic angles are clear. Normal cardio-mediastinal silhouette. There are acute mildly displaced fractures of the posterolateral left eighth through tenth ribs. The soft tissues are within normal limits. IMPRESSION: Acute mildly displaced fractures of the posterolateral left eighth through tenth ribs. No  pneumothorax or pleural effusion. Electronically Signed   By: Ree Molt M.D.   On: 05/09/2024 13:52    Assessment and Plan :   PDMP not reviewed this encounter.  1. Left-sided chest wall pain   2. Closed fracture of multiple ribs of left side, initial encounter    Discussed possibility of presenting to the emergency room for chest CT scan if she has 3 consecutive multiple rib fractures with mild displacement.  Patient is not interested in this at the moment.  I advised to maintain strict ER precautions.  Start Tylenol , can use together with tramadol  as needed as prescribed by her PCP.  Can also use Robaxin  but not with tramadol .  Counseled patient on potential for adverse effects with medications prescribed/recommended today, ER and return-to-clinic precautions discussed, patient verbalized understanding.    Christopher Savannah, PA-C 05/09/24 1427

## 2024-05-09 NOTE — ED Triage Notes (Signed)
 Pt reports she fell on WEDNESDAY night. Pt hit the arm rest on her Lazy-boy chair. Pt now has Lt side pain and back pain. Pt ambulatory to room with personal walker .

## 2024-05-09 NOTE — Discharge Instructions (Addendum)
 Do not use any nonsteroidal anti-inflammatories (NSAIDs) like ibuprofen, Motrin, naproxen, Aleve, etc. which are all available over-the-counter.  Please just use Tylenol  at a dose of 500mg -650mg  once every 6 hours as needed for your aches, pains.  You can use methocarbamol , a muscle relaxant, at bedtime only. You can use Tramadol  for severe pain together with Tylenol  but do not use methocarbamol  with the Tramadol .   Please follow up with your PCP.

## 2024-05-20 ENCOUNTER — Ambulatory Visit: Payer: Self-pay

## 2024-05-20 NOTE — Telephone Encounter (Signed)
 FYI Only or Action Required?: FYI only for provider.  Patient was last seen in primary care on 02/20/2024 by Charlanne Fredia CROME, MD.  Called Nurse Triage reporting Chest Pain.  Symptoms began several days ago.  Interventions attempted: Nothing.  Symptoms are: unchanged.  Triage Disposition: See PCP When Office is Open (Within 3 - 7 Days)  Patient/caregiver understands and will follow disposition?: Yes  This RN called [facility] and they were able to schedule her appointment.  Copied from CRM [id number]. Topic: Clinical - Red Word Triage >> May 20, 2024  3:44 PM Susanna ORN wrote: Red Word that prompted transfer to Nurse Triage: Patient is calling to schedule appt with Dr. Charlanne at [facility]. States she had a fall last week and has 3 cracked ribs. Went to urgent care and they want her to follow up with PCP. Checked PCP schedule and next availability is October 1st. Patient requesting to see someone immediately because she's in pain. Reason for Disposition  [1] Chest pain(s) lasting a few seconds AND [2] persists > 3 days  Answer Assessment - Initial Assessment Questions 1. LOCATION: Where does it hurt?       Cracked ribs on left side 2. RADIATION: Does the pain go anywhere else? (e.g., into neck, jaw, arms, back)     Denies, hx arthritis 3. ONSET: When did the chest pain begin? (Minutes, hours or days)      Last week 4. PATTERN: Does the pain come and go, or has it been constant since it started?  Does it get worse with exertion?      constant 5. DURATION: How long does it last (e.g., seconds, minutes, hours)     Constant rib pain 6. SEVERITY: How bad is the pain?  (e.g., Scale 1-10; mild, moderate, or severe)     Moderate to severe 7. CARDIAC RISK FACTORS: Do you have any history of heart problems or risk factors for heart disease? (e.g., angina, prior heart attack; diabetes, high blood pressure, high cholesterol, smoker, or strong family history of heart disease)      denies 8. PULMONARY RISK FACTORS: Do you have any history of lung disease?  (e.g., blood clots in lung, asthma, emphysema, birth control pills)     copd 9. CAUSE: What do you think is causing the chest pain?     Rib's cracked 10. OTHER SYMPTOMS: Do you have any other symptoms? (e.g., dizziness, nausea, vomiting, sweating, fever, difficulty breathing, cough)       denies 11. PREGNANCY: Is there any chance you are pregnant? When was your last menstrual period?       na  Protocols used: Chest Pain-A-AH

## 2024-05-20 NOTE — Telephone Encounter (Signed)
 Message routed to PCP Charlanne Fredia CROME, MD

## 2024-05-20 NOTE — Telephone Encounter (Signed)
 Robin Pellet, RN to [facility]  (Selected Message)     05/20/24  3:56 PM Apt next week

## 2024-05-28 ENCOUNTER — Non-Acute Institutional Stay: Admitting: Internal Medicine

## 2024-05-28 ENCOUNTER — Encounter: Payer: Self-pay | Admitting: Internal Medicine

## 2024-05-28 VITALS — BP 124/88 | HR 85 | Temp 97.2°F | Resp 18 | Ht 63.0 in | Wt 122.4 lb

## 2024-05-28 DIAGNOSIS — R600 Localized edema: Secondary | ICD-10-CM | POA: Diagnosis not present

## 2024-05-28 DIAGNOSIS — J410 Simple chronic bronchitis: Secondary | ICD-10-CM

## 2024-05-28 DIAGNOSIS — S2242XD Multiple fractures of ribs, left side, subsequent encounter for fracture with routine healing: Secondary | ICD-10-CM | POA: Diagnosis not present

## 2024-05-28 DIAGNOSIS — I351 Nonrheumatic aortic (valve) insufficiency: Secondary | ICD-10-CM | POA: Diagnosis not present

## 2024-05-28 MED ORDER — FUROSEMIDE 20 MG PO TABS: 20.0000 mg | ORAL_TABLET | Freq: Every day | ORAL | 0 refills | Status: DC | PRN

## 2024-05-28 MED ORDER — ALBUTEROL SULFATE HFA 108 (90 BASE) MCG/ACT IN AERS: 2.0000 | INHALATION_SPRAY | Freq: Four times a day (QID) | RESPIRATORY_TRACT | 3 refills | Status: AC | PRN

## 2024-05-28 MED ORDER — POTASSIUM CHLORIDE CRYS ER 10 MEQ PO TBCR: 10.0000 meq | EXTENDED_RELEASE_TABLET | Freq: Every day | ORAL | 0 refills | Status: DC

## 2024-05-28 MED ORDER — TRAMADOL HCL 50 MG PO TABS: 25.0000 mg | ORAL_TABLET | Freq: Three times a day (TID) | ORAL | 0 refills | Status: DC | PRN

## 2024-05-28 MED ORDER — FUROSEMIDE 20 MG PO TABS
20.0000 mg | ORAL_TABLET | Freq: Every day | ORAL | 0 refills | Status: DC
Start: 2024-05-28 — End: 2024-05-28

## 2024-05-28 NOTE — Progress Notes (Signed)
 [facility] Service:  Clinic (12)  Provider:   Code Status: DNR Goals of Care:     01/10/2024    4:09 PM  Advanced Directives  Does Patient Have a Medical Advance Directive? No     Chief Complaint  Patient presents with   Hospitalization Follow-up     seen in the ER 05/09/2024     HPI: Patient is a 89 y.o. female seen today for an acute visit for ED follow up  Discussed the use of AI scribe software for clinical note transcription with the patient, who gave verbal consent to proceed.  History of Present Illness   Robin [name] is a 89 year old female who presents with pain and shortness of breath following a fall.  She fell while stepping over her Foot elliptical machine, and fell on her Recliner resulting in significant pain described as 'terrible'.  Went to ED Chest xray showed  Acute mildly displaced fractures of the posterolateral left eighth through tenth ribs. No pneumothorax or pleural effusion.   This pain has worsened her shortness of breath. She has not taken Tylenol  today but is considering using Tramadol , which she has used previously for back pain. Robaxin  is available but not recently used.  Shortness of breath is more pronounced when lying down. She uses a ProAir  inhaler two to three times daily as needed and experiences panic if it is unavailable, especially at night. Spiriva  is also used, though the frequency is unspecified.  Ankle swelling began after the rib injury and has improved but persists. She stopped using Lasix  in November 2021 due to frequent urination.  Constipation is present, with no recent bowel movement. She uses Senna Plus for relief but has not taken it yet.  She manages personal care independently but sleeps in a wingback chair due to rib pain, finding it more comfortable.  Had Some questions about Respite Care  Other History Lives [place] in [facility]   Has h/o Severe AS with AR  Depression and  anxiety Chronic back pain  Alternating diarrhea and constipation questionable IBS.  Has seen GI. COPD HLD, hypertension  Past Medical History:  Diagnosis Date   Arthritis    COPD (chronic obstructive pulmonary disease) (HCC)    Depression    Diverticulosis    GERD (gastroesophageal reflux disease)    Hyperlipemia    Hypertension    Irregular heart beat 2014   PVCs   Moderate to severe aortic insufficiency 07/2019   Echo (03/08/2020): Aortic stenosis is now severe with mean gradient 60 mmHg. Aortic regurgitation is moderate to severe.   Osteoporosis    Severe aortic stenosis 06/11/2019   Echo 06/2019: EF 60-65%. Mild basal septal LVH, GR 2 DD. Mod LA dilation.- High LVEDP. Severe AI, Mod AS-peak gradient 43 mmHg, mean 28 mmHg;; 02/2020: Mean AoV Gradient 60 mmHg with Mod AI. EF 70-755.  Mod LVH. Mod LA dilation. Mod MR.    Past Surgical History:  Procedure Laterality Date   ABDOMINAL HYSTERECTOMY     both appy and hyst done together   APPENDECTOMY     CATARACT EXTRACTION     TRANSTHORACIC ECHOCARDIOGRAM  09/09/2021   : EF 60 to 65%.  Normal LV size and function.  Mild LVH.  Indeterminate D Fxn-elevated LAP with Mod LA dilation.  Severe MAC with moderate MR/MS (mean gradient 9.1 mmHg).  Moderate AoV calcification with severe thickening-> Mod to Severe AS/AI; no change.   TRANSTHORACIC  ECHOCARDIOGRAM  09/06/2020   EF 60-65%.  Normal LV fxn.  Mod Conc LVH of basal septal segment.  Indeterminate filling pattern (likely Gr2DD w/ Mod LA dilation).  Nl RV size and function.  Mildly increased PAP ~ 39.5 mmHg.  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  Mild-Mod TR.  Severe AoV Ca++ -> Mod-Severe AI & AS (mean gradient 33 mmHg) - ? lower b.c lower LVEF?    Allergies  Allergen Reactions   Fosamax [Alendronate Sodium]     Poor healing to mouth wound   Augmentin [Amoxicillin-Pot Clavulanate] Nausea Only    DID THE REACTION INVOLVE: Swelling of the  face/tongue/throat, SOB, or low BP? No Sudden or severe rash/hives, skin peeling, or the inside of the mouth or nose? No Did it require medical treatment? No When did it last happen?       If all above answers are "NO", may proceed with cephalosporin use.    Hydrocodone Nausea And Vomiting   Mobic [Meloxicam] Rash   Sulfonamide Derivatives Rash    Outpatient Encounter Medications as of 05/28/2024  Medication Sig   acetaminophen  (TYLENOL ) 325 MG tablet Take 2 tablets (650 mg total) by mouth every 6 (six) hours as needed for moderate pain (pain score 4-6).   aspirin  81 MG tablet Take 81 mg by mouth daily.   lovastatin (MEVACOR) 40 MG tablet TAKE 1 TABLET BY MOUTH EVERY DAY   methocarbamol  (ROBAXIN ) 500 MG tablet Take 1 tablet (500 mg total) by mouth at bedtime as needed for muscle spasms.   pantoprazole  (PROTONIX ) 20 MG tablet TAKE 1 TABLET BY MOUTH EVERY DAY AS NEEDED   potassium chloride  (KLOR-CON  M) 10 MEQ tablet Take 1 tablet (10 mEq total) by mouth daily.   Probiotic Product (PROBIOTIC DAILY PO) Take 1 capsule by mouth daily.   sertraline  (ZOLOFT ) 100 MG tablet TAKE 1 TABLET BY MOUTH EVERY DAY   tiotropium (SPIRIVA  HANDIHALER) 18 MCG inhalation capsule Place 1 capsule (18 mcg total) into inhaler and inhale daily.   traMADol  (ULTRAM ) 50 MG tablet Take 0.5 tablets (25 mg total) by mouth every 8 (eight) hours as needed.   valsartan-hydrochlorothiazide (DIOVAN-HCT) 320-12.5 MG tablet TAKE 1 TABLET BY MOUTH EVERY DAY   [DISCONTINUED] albuterol  (VENTOLIN  HFA) 108 (90 Base) MCG/ACT inhaler Inhale 2 puffs into the lungs every 6 (six) hours as needed for wheezing or shortness of breath.   [DISCONTINUED] furosemide  (LASIX ) 20 MG tablet Take 1 tablet (20 mg total) by mouth daily.   albuterol  (VENTOLIN  HFA) 108 (90 Base) MCG/ACT inhaler Inhale 2 puffs into the lungs every 6 (six) hours as needed for wheezing or shortness of breath.   furosemide  (LASIX ) 20 MG tablet Take 1 tablet (20 mg total) by  mouth daily as needed.   No facility-administered encounter medications on file as of 05/28/2024.    Review of Systems:  Review of Systems  Constitutional:  Positive for activity change. Negative for appetite change.  HENT: Negative.    Respiratory:  Positive for shortness of breath. Negative for cough.   Cardiovascular:  Positive for leg swelling.  Gastrointestinal:  Positive for constipation.  Genitourinary: Negative.   Musculoskeletal:  Positive for arthralgias and gait problem. Negative for myalgias.  Skin: Negative.   Neurological:  Negative for dizziness and weakness.  Psychiatric/Behavioral:  Negative for confusion, dysphoric mood and sleep disturbance.     Health Maintenance  Topic Date Due   Influenza Vaccine  03/28/2024  COVID-19 Vaccine (6 - 2025-26 season) 04/28/2024   Medicare Annual Wellness (AWV)  01/13/2025   DTaP/Tdap/Td (3 - Td or Tdap) 03/19/2034   Pneumococcal Vaccine: 50+ Years  Completed   DEXA SCAN  Completed   HPV VACCINES  Aged Out   Meningococcal B Vaccine  Aged Out   Zoster Vaccines- Shingrix  Discontinued    Physical Exam: Vitals:   05/28/24 1048  BP: 124/88  Pulse: 85  Resp: 18  Temp: (!) 97.2 F (36.2 C)  SpO2: 98%  Weight: 122 lb 6.4 oz (55.5 kg)  Height: 5' 3 (1.6 m)   Body mass index is 21.68 kg/m. Physical Exam Vitals reviewed.  Constitutional:      Appearance: Normal appearance.  HENT:     Head: Normocephalic.     Nose: Nose normal.     Mouth/Throat:     Mouth: Mucous membranes are moist.     Pharynx: Oropharynx is clear.  Eyes:     Pupils: Pupils are equal, round, and reactive to light.  Cardiovascular:     Rate and Rhythm: Normal rate and regular rhythm.     Pulses: Normal pulses.     Heart sounds: Murmur heard.  Pulmonary:     Effort: Pulmonary effort is normal.     Breath sounds: Normal breath sounds.     Comments: Rales heard in Left Lower chest area Right Lungs were Clear Abdominal:     General: Abdomen is  flat. Bowel sounds are normal.     Palpations: Abdomen is soft.  Musculoskeletal:        General: No swelling.     Cervical back: Neck supple.  Skin:    General: Skin is warm.  Neurological:     General: No focal deficit present.     Mental Status: She is alert and oriented to person, place, and time.  Psychiatric:        Mood and Affect: Mood normal.        Thought Content: Thought content normal.     Labs reviewed: Basic Metabolic Panel: Recent Labs    09/27/23 0830 10/24/23 1358 11/15/23 0850  NA 137 136 138  K 3.0* 3.6 3.8  CL 98 97* 100  CO2 30 30 28   GLUCOSE 99 98 101*  BUN 19 17 17   CREATININE 0.87 0.83 0.90  CALCIUM 8.7 8.9 8.7  TSH  --   --  1.83   Liver Function Tests: Recent Labs    09/27/23 0830 11/15/23 0850  AST 18 17  ALT 13 10  BILITOT 0.7 0.6  PROT 6.2 6.5   Recent Labs    09/27/23 0830  LIPASE 20   No results for input(s): AMMONIA in the last 8760 hours. CBC: Recent Labs    09/27/23 0830 11/15/23 0850  WBC 4.0 5.3  NEUTROABS 2,436 3,413  HGB 11.9 10.6*  HCT 34.9* 31.8*  MCV 88.6 88.1  PLT 193 225   Lipid Panel: Recent Labs    11/15/23 0850  CHOL 150  HDL 53  LDLCALC 79  TRIG 101  CHOLHDL 2.8   No results found for: HGBA1C  Procedures since last visit: DG Ribs Unilateral W/Chest Left Result Date: 05/09/2024 CLINICAL DATA:  left chest wall pain. EXAM: LEFT RIBS AND CHEST - 3+ VIEW COMPARISON:  09/04/2018. FINDINGS: Bilateral lung fields are clear. Bilateral costophrenic angles are clear. Normal cardio-mediastinal silhouette. There are acute mildly displaced fractures of the posterolateral left eighth through tenth ribs. The soft tissues are within  normal limits. IMPRESSION: Acute mildly displaced fractures of the posterolateral left eighth through tenth ribs. No pneumothorax or pleural effusion. Electronically Signed   By: Ree Molt M.D.   On: 05/09/2024 13:52    Assessment/Plan  Assessment and Plan    Rib  fractures with acute pain Rib fractures causing significant pain, impacting breathing, and exacerbating shortness of breath. Pain management crucial to prevent complications. - Prescribed Tylenol , 2 tablets three times a day. - Prescribed tramadol  as needed. - Encouraged use of Robaxin  at night. - Advised proactive pain management.  Chronic obstructive pulmonary disease (COPD) COPD exacerbated by rib fractures limiting lung expansion. No signs of heart failure or pneumonia. - Refilled ProAir  inhaler for use as needed, 2-3 times a day. - Encouraged use of Spiriva  inhaler as prescribed. - Advised seeking emergency care for significant shortness of breath.  Lower extremity edema Lasix  20 mg every day for 3 days an then Prn With Potassium - Prescribed Lasix , 30 tablets, for 3 days.  Constipation Constipation related to decreased mobility and pain from rib fractures. Additional measures needed due to diuretic use. - Continued Senna Plus. - Encouraged use of prunes.  Follow-Up Follow-up needed to monitor recovery and management of conditions. - Scheduled follow-up appointment in 2 weeks.      Discuss Respite care available in [facility] if she needs Does have caregiver coming to help her  Labs/tests ordered:  * No order type specified * Next appt:  06/11/2024

## 2024-05-28 NOTE — Patient Instructions (Addendum)
 1) Please visit your local pharmacy to receive your flu and covid vaccine, if you have not already received.    Take Tylenol  500 mg 2 tabs three times a day  Take Tramadol  25 mg as needed Take Robaxin  250 mg As needed Go TO ED if have sudden SOB  Lasix  20 mg Take every day for 3 days with potassium and then you can take it as needed for Swelling in lungs

## 2024-05-29 ENCOUNTER — Other Ambulatory Visit: Payer: Self-pay | Admitting: Internal Medicine

## 2024-06-11 ENCOUNTER — Non-Acute Institutional Stay: Admitting: Internal Medicine

## 2024-06-11 ENCOUNTER — Encounter: Payer: Self-pay | Admitting: Internal Medicine

## 2024-06-11 VITALS — BP 120/68 | HR 88 | Temp 97.3°F | Resp 18 | Ht 63.0 in | Wt 114.7 lb

## 2024-06-11 DIAGNOSIS — S2242XD Multiple fractures of ribs, left side, subsequent encounter for fracture with routine healing: Secondary | ICD-10-CM | POA: Diagnosis not present

## 2024-06-11 DIAGNOSIS — K219 Gastro-esophageal reflux disease without esophagitis: Secondary | ICD-10-CM

## 2024-06-11 DIAGNOSIS — J410 Simple chronic bronchitis: Secondary | ICD-10-CM | POA: Diagnosis not present

## 2024-06-11 DIAGNOSIS — R630 Anorexia: Secondary | ICD-10-CM | POA: Diagnosis not present

## 2024-06-11 DIAGNOSIS — M545 Low back pain, unspecified: Secondary | ICD-10-CM

## 2024-06-11 DIAGNOSIS — I351 Nonrheumatic aortic (valve) insufficiency: Secondary | ICD-10-CM

## 2024-06-11 DIAGNOSIS — R600 Localized edema: Secondary | ICD-10-CM | POA: Diagnosis not present

## 2024-06-11 DIAGNOSIS — G8929 Other chronic pain: Secondary | ICD-10-CM

## 2024-06-11 MED ORDER — PANTOPRAZOLE SODIUM 20 MG PO TBEC: 20.0000 mg | DELAYED_RELEASE_TABLET | Freq: Every day | ORAL | Status: DC

## 2024-06-11 MED ORDER — METHOCARBAMOL 500 MG PO TABS: 250.0000 mg | ORAL_TABLET | Freq: Every evening | ORAL | Status: DC | PRN

## 2024-06-11 NOTE — Progress Notes (Unsigned)
 [facility]: [facility] Service:  Clinic (12)  Provider:   Code Status: *** Goals of Care:     01/10/2024    4:09 PM  Advanced Directives  Does Patient Have a Medical Advance Directive? No     Chief Complaint  Patient presents with   Medical Management of Chronic Issues    Two week follow-up    HPI: Patient is a 89 y.o. female seen today for an acute visit for  Past Medical History:  Diagnosis Date   Arthritis    COPD (chronic obstructive pulmonary disease) (HCC)    Depression    Diverticulosis    GERD (gastroesophageal reflux disease)    Hyperlipemia    Hypertension    Irregular heart beat 2014   PVCs   Moderate to severe aortic insufficiency 07/2019   Echo (03/08/2020): Aortic stenosis is now severe with mean gradient 60 mmHg. Aortic regurgitation is moderate to severe.   Osteoporosis    Severe aortic stenosis 06/11/2019   Echo 06/2019: EF 60-65%. Mild basal septal LVH, GR 2 DD. Mod LA dilation.- High LVEDP. Severe AI, Mod AS-peak gradient 43 mmHg, mean 28 mmHg;; 02/2020: Mean AoV Gradient 60 mmHg with Mod AI. EF 70-755.  Mod LVH. Mod LA dilation. Mod MR.    Past Surgical History:  Procedure Laterality Date   ABDOMINAL HYSTERECTOMY     both appy and hyst done together   APPENDECTOMY     CATARACT EXTRACTION     TRANSTHORACIC ECHOCARDIOGRAM  09/09/2021   : EF 60 to 65%.  Normal LV size and function.  Mild LVH.  Indeterminate D Fxn-elevated LAP with Mod LA dilation.  Severe MAC with moderate MR/MS (mean gradient 9.1 mmHg).  Moderate AoV calcification with severe thickening-> Mod to Severe AS/AI; no change.   TRANSTHORACIC ECHOCARDIOGRAM  09/06/2020   EF 60-65%.  Normal LV fxn.  Mod Conc LVH of basal septal segment.  Indeterminate filling pattern (likely Gr2DD w/ Mod LA dilation).  Nl RV size and function.  Mildly increased PAP ~ 39.5 mmHg.  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  Mild-Mod TR.  Severe AoV Ca++ ->  Mod-Severe AI & AS (mean gradient 33 mmHg) - ? lower b.c lower LVEF?    Allergies  Allergen Reactions   Fosamax [Alendronate Sodium]     Poor healing to mouth wound   Augmentin [Amoxicillin-Pot Clavulanate] Nausea Only    DID THE REACTION INVOLVE: Swelling of the face/tongue/throat, SOB, or low BP? No Sudden or severe rash/hives, skin peeling, or the inside of the mouth or nose? No Did it require medical treatment? No When did it last happen?       If all above answers are "NO", may proceed with cephalosporin use.    Hydrocodone Nausea And Vomiting   Mobic [Meloxicam] Rash   Sulfonamide Derivatives Rash    Outpatient Encounter Medications as of 06/11/2024  Medication Sig   acetaminophen  (TYLENOL ) 325 MG tablet Take 2 tablets (650 mg total) by mouth every 6 (six) hours as needed for moderate pain (pain score 4-6).   albuterol  (VENTOLIN  HFA) 108 (90 Base) MCG/ACT inhaler Inhale 2 puffs into the lungs every 6 (six) hours as needed for wheezing or shortness of breath.   aspirin  81 MG tablet Take 81 mg by mouth daily.   furosemide  (LASIX ) 20 MG tablet Take 1 tablet (20 mg total) by mouth daily as needed.   lovastatin (MEVACOR)  40 MG tablet TAKE 1 TABLET BY MOUTH EVERY DAY   potassium chloride  (KLOR-CON  M) 10 MEQ tablet Take 1 tablet (10 mEq total) by mouth daily.   Probiotic Product (PROBIOTIC DAILY PO) Take 1 capsule by mouth daily.   sertraline  (ZOLOFT ) 100 MG tablet TAKE 1 TABLET BY MOUTH EVERY DAY   tiotropium (SPIRIVA  HANDIHALER) 18 MCG inhalation capsule Place 1 capsule (18 mcg total) into inhaler and inhale daily.   traMADol  (ULTRAM ) 50 MG tablet Take 0.5 tablets (25 mg total) by mouth every 8 (eight) hours as needed.   valsartan-hydrochlorothiazide (DIOVAN-HCT) 320-12.5 MG tablet TAKE 1 TABLET BY MOUTH EVERY DAY   [DISCONTINUED] methocarbamol  (ROBAXIN ) 500 MG tablet Take 1 tablet (500 mg total) by mouth at bedtime as needed for muscle spasms.   [DISCONTINUED] pantoprazole   (PROTONIX ) 20 MG tablet TAKE 1 TABLET BY MOUTH EVERY DAY AS NEEDED   methocarbamol  (ROBAXIN ) 500 MG tablet Take 0.5 tablets (250 mg total) by mouth at bedtime as needed for muscle spasms.   pantoprazole  (PROTONIX ) 20 MG tablet Take 1 tablet (20 mg total) by mouth daily.   No facility-administered encounter medications on file as of 06/11/2024.    Review of Systems:  Review of Systems  Health Maintenance  Topic Date Due   Influenza Vaccine  03/28/2024   COVID-19 Vaccine (6 - 2025-26 season) 04/28/2024   Medicare Annual Wellness (AWV)  01/13/2025   DTaP/Tdap/Td (3 - Td or Tdap) 03/19/2034   Pneumococcal Vaccine: 50+ Years  Completed   DEXA SCAN  Completed   Meningococcal B Vaccine  Aged Out   Zoster Vaccines- Shingrix  Discontinued    Physical Exam: Vitals:   06/11/24 1317  BP: 120/68  Pulse: 88  Resp: 18  Temp: (!) 97.3 F (36.3 C)  SpO2: 98%  Weight: 114 lb 11.2 oz (52 kg)  Height: 5' 3 (1.6 m)   Body mass index is 20.32 kg/m. Physical Exam  Labs reviewed: Basic Metabolic Panel: Recent Labs    09/27/23 0830 10/24/23 1358 11/15/23 0850  NA 137 136 138  K 3.0* 3.6 3.8  CL 98 97* 100  CO2 30 30 28   GLUCOSE 99 98 101*  BUN 19 17 17   CREATININE 0.87 0.83 0.90  CALCIUM 8.7 8.9 8.7  TSH  --   --  1.83   Liver Function Tests: Recent Labs    09/27/23 0830 11/15/23 0850  AST 18 17  ALT 13 10  BILITOT 0.7 0.6  PROT 6.2 6.5   Recent Labs    09/27/23 0830  LIPASE 20   No results for input(s): AMMONIA in the last 8760 hours. CBC: Recent Labs    09/27/23 0830 11/15/23 0850  WBC 4.0 5.3  NEUTROABS 2,436 3,413  HGB 11.9 10.6*  HCT 34.9* 31.8*  MCV 88.6 88.1  PLT 193 225   Lipid Panel: Recent Labs    11/15/23 0850  CHOL 150  HDL 53  LDLCALC 79  TRIG 101  CHOLHDL 2.8   No results found for: HGBA1C  Procedures since last visit: No results found.  Assessment/Plan 1. Closed fracture of multiple ribs of left side with routine healing,  subsequent encounter (Primary) ***  2. Bilateral leg edema ***  3. Severe aortic insufficiency and AS ***  4. Simple chronic bronchitis (HCC) ***  5. Chronic bilateral low back pain without sciatica ***  6. Gastroesophageal reflux disease, unspecified whether esophagitis present ***  7. Depression, recurrent ***  8. Essential hypertension ***    Labs/tests  ordered:  * No order type specified * Next appt:  08/13/2024

## 2024-06-11 NOTE — Patient Instructions (Signed)
 Labs will be done on August 11, 2024 at [facility] at 7:45 am

## 2024-06-12 MED ORDER — PANTOPRAZOLE SODIUM 20 MG PO TBEC: 20.0000 mg | DELAYED_RELEASE_TABLET | Freq: Two times a day (BID) | ORAL | Status: DC

## 2024-06-12 NOTE — Progress Notes (Signed)
 Cardiology Office Note:    Date:  06/13/2024   ID:  Robin [name], Robin [name] 1931-10-05, MRN [id number]  PCP:  Charlanne Fredia CROME, MD   [facility] Providers Cardiologist:  Alm Clay, MD     Referring MD: Charlanne Fredia CROME, MD   Chief complaint: Follow-up of valvular disease  History of Present Illness:    Robin [name] is a 89 y.o. female with a hx of severe aortic stenosis, moderate-severe aortic insufficiency, PVCs, hypertension, hyperlipidemia, GERD, COPD who is followed by Dr. Clay presents today for routine follow-up of valvular disease.  Previously was a patient of Dr. Blanca in 2019 for follow-up of moderate aortic stenosis.  He working with Dr. Clay in October 2020.  Echo at that time showed trace mitral valve regurgitation, mild tricuspid regurgitation, moderate thickening of AV with moderate AV stenosis.  Patient was referred to the structural heart team in July 2021 for progression of her aortic stenosis and insufficiency to severe.  She was also coping with the loss of her husband around that time.  She was evaluated by Dr. Verlin with the structural heart team in August 2021, patient was unsure if she wished to proceed with TAVR at that time.  Office visit in March 2022 patient was relatively asymptomatic and limited more by arthritic pains.  The patient decided she did not want to go forward with any further testing towards TAVR, and decided to stop checking surveillance echoes.  With goals being to continue blood pressure control and attempt to reduce risk factors for progression of disease.  June 2024 patient was taken off of beta-blockers for her PVCs as she did not tolerate them 2/2 fatigue and wheezing.  PVCs were stated to be well-controlled at that point, if they were to recur, it was recommended to switch from amlodipine  to diltiazem.  Most recently seen by cardiology in April 2025. Patient was stable at this time.  Using a walker for assistance and a  motorized scooter for longer distances.  DNR status was discussed, patient stated she would follow-up with her PCP for further discussion.  Patient reports with her aide today, appears stable from a cardiovascular standpoint.  She denies chest pain, palpitations,  n, v, dark/tarry/bloody stools, hematuria, dizziness, syncope, weight gain. Reports having recently tripped over an elliptical machine, fell, and fractured some ribs on 05/09/2024, patient received care at a local urgent care for this.  Was prescribed Robaxin , did not take any, has been taking tramadol  to treat the pain and subsequently became constipated.  Since the fall, she has noticed a decrease in her energy, increase in DOE, making showering and dressing difficult, as well as a an increase in bilateral LE edema.  PCP trialed on 20 mg Lasix  X 3 days without much improvement in symptoms.  Patient states she has lost some of her appetite, lost some weight (~5 lbs), and was wondering whether an echocardiogram would help us  to figure out her issue.  We discussed her previous echo results, severe aortic valve disease, and previous goals of discontinuing monitoring of valve disease.  Patient does not wish to pursue any valve repair options.    Past Medical History:  Diagnosis Date   Arthritis    COPD (chronic obstructive pulmonary disease) (HCC)    Depression    Diverticulosis    GERD (gastroesophageal reflux disease)    Hyperlipemia    Hypertension    Irregular heart beat 2014   PVCs   Moderate to severe aortic  insufficiency 07/2019   Echo (03/08/2020): Aortic stenosis is now severe with mean gradient 60 mmHg. Aortic regurgitation is moderate to severe.   Osteoporosis    Severe aortic stenosis 06/11/2019   Echo 06/2019: EF 60-65%. Mild basal septal LVH, GR 2 DD. Mod LA dilation.- High LVEDP. Severe AI, Mod AS-peak gradient 43 mmHg, mean 28 mmHg;; 02/2020: Mean AoV Gradient 60 mmHg with Mod AI. EF 70-755.  Mod LVH. Mod LA dilation. Mod MR.     Past Surgical History:  Procedure Laterality Date   ABDOMINAL HYSTERECTOMY     both appy and hyst done together   APPENDECTOMY     CATARACT EXTRACTION     TRANSTHORACIC ECHOCARDIOGRAM  09/09/2021   : EF 60 to 65%.  Normal LV size and function.  Mild LVH.  Indeterminate D Fxn-elevated LAP with Mod LA dilation.  Severe MAC with moderate MR/MS (mean gradient 9.1 mmHg).  Moderate AoV calcification with severe thickening-> Mod to Severe AS/AI; no change.   TRANSTHORACIC ECHOCARDIOGRAM  09/06/2020   EF 60-65%.  Normal LV fxn.  Mod Conc LVH of basal septal segment.  Indeterminate filling pattern (likely Gr2DD w/ Mod LA dilation).  Nl RV size and function.  Mildly increased PAP ~ 39.5 mmHg.  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  Mild-Mod TR.  Severe AoV Ca++ -> Mod-Severe AI & AS (mean gradient 33 mmHg) - ? lower b.c lower LVEF?    Current Medications: Current Meds  Medication Sig   acetaminophen  (TYLENOL ) 325 MG tablet Take 2 tablets (650 mg total) by mouth every 6 (six) hours as needed for moderate pain (pain score 4-6).   albuterol  (VENTOLIN  HFA) 108 (90 Base) MCG/ACT inhaler Inhale 2 puffs into the lungs every 6 (six) hours as needed for wheezing or shortness of breath.   aspirin  81 MG tablet Take 81 mg by mouth daily.   furosemide  (LASIX ) 20 MG tablet Take 1 tablet (20 mg total) by mouth daily.   lovastatin (MEVACOR) 40 MG tablet TAKE 1 TABLET BY MOUTH EVERY DAY   methocarbamol  (ROBAXIN ) 500 MG tablet Take 0.5 tablets (250 mg total) by mouth at bedtime as needed for muscle spasms.   pantoprazole  (PROTONIX ) 20 MG tablet Take 1 tablet (20 mg total) by mouth 2 (two) times daily.   potassium chloride  (KLOR-CON  M) 10 MEQ tablet Take 1 tablet (10 mEq total) by mouth daily. Take only when taking lasix    Probiotic Product (PROBIOTIC DAILY PO) Take 1 capsule by mouth daily.   sertraline  (ZOLOFT ) 100 MG tablet TAKE 1 TABLET BY MOUTH EVERY DAY   tiotropium (SPIRIVA   HANDIHALER) 18 MCG inhalation capsule Place 1 capsule (18 mcg total) into inhaler and inhale daily.   valsartan-hydrochlorothiazide (DIOVAN-HCT) 320-12.5 MG tablet TAKE 1 TABLET BY MOUTH EVERY DAY   [DISCONTINUED] furosemide  (LASIX ) 20 MG tablet Take 1 tablet (20 mg total) by mouth daily as needed.   [DISCONTINUED] potassium chloride  (KLOR-CON  M) 10 MEQ tablet Take 1 tablet (10 mEq total) by mouth daily.     Allergies:   Fosamax [alendronate sodium], Augmentin [amoxicillin-pot clavulanate], Hydrocodone, Mobic [meloxicam], and Sulfonamide derivatives   Social History   Socioeconomic History   Marital status: Married    Spouse name: Not on file   Number of children: 2   Years of education: Not on file   Highest education level: Not on file  Occupational History   Occupation: retired-schoolteacher    Comment: Teacher  Tobacco Use  Smoking status: Former    Current packs/day: 0.00    Average packs/day: 1 pack/day for 30.0 years (30.0 ttl pk-yrs)    Types: Cigarettes    Start date: 08/28/1978    Quit date: 08/28/2008    Years since quitting: 15.8   Smokeless tobacco: Never  Vaping Use   Vaping status: Never Used  Substance and Sexual Activity   Alcohol use: Not Currently    Comment: Every now and then   Drug use: No   Sexual activity: Not on file  Other Topics Concern   Not on file  Social History Narrative   Currently remarried.  New husband has significant cardiac history.  (Followed by Dr. Anner)   She is a retired Runner, broadcasting/film/video.      She and her new husband Darcel) moved to [facility] just prior to the outbreak of the pandemic in the Spring of 2020.    Darcel - Jenefer Woerner) died 09-Feb-2020 -> longstanding Parkinson's disease complicated by dementia.      --> Now finally able to work with PT/OT to rehab - chronic back pain with very unsteady gait/poor balance. -- Uses cane for short distance - but mostly uses Walker.  Really only walks to & from the cafeteria if not working  with therapy.She does have a pretty unsteady gait and has to either use a cane for short distances or uses a walker for longer distance. -->This is really because of her bad back and poor balance.  As result of this, she really is not all that active, and is very upset about not being on to do her rehab.   Social Drivers of Corporate investment banker Strain: Low Risk  (01/14/2024)   Overall Financial Resource Strain (CARDIA)    Difficulty of Paying Living Expenses: Not hard at all  Food Insecurity: No Food Insecurity (01/14/2024)   Hunger Vital Sign    Worried About Running Out of Food in the Last Year: Never true    Ran Out of Food in the Last Year: Never true  Transportation Needs: No Transportation Needs (01/14/2024)   PRAPARE - Administrator, Civil Service (Medical): No    Lack of Transportation (Non-Medical): No  Physical Activity: Insufficiently Active (01/14/2024)   Exercise Vital Sign    Days of Exercise per Week: 2 days    Minutes of Exercise per Session: 40 min  Stress: No Stress Concern Present (01/14/2024)   [employer] of Occupational Health - Occupational Stress Questionnaire    Feeling of Stress : Only a little  Social Connections: Socially Isolated (01/14/2024)   Social Connection and Isolation Panel    Frequency of Communication with Friends and Family: More than three times a week    Frequency of Social Gatherings with Friends and Family: More than three times a week    Attends Religious Services: Never    Database administrator or Organizations: No    Attends Banker Meetings: Never    Marital Status: Widowed     Family History: The patient's family history includes Cancer in her sister and sister; Hypertension in her mother; Lung cancer in her father.  ROS:   Please see the history of present illness.    All other systems reviewed and are negative.  EKGs/Labs/Other Studies Reviewed:    The following studies were reviewed  today:       Recent Labs: 11/15/2023: ALT 10; BUN 17; Creat 0.90; Hemoglobin 10.6; Platelets 225; Potassium  3.8; Sodium 138; TSH 1.83  Recent Lipid Panel    Component Value Date/Time   CHOL 150 11/15/2023 0850   TRIG 101 11/15/2023 0850   HDL 53 11/15/2023 0850   CHOLHDL 2.8 11/15/2023 0850   LDLCALC 79 11/15/2023 0850     Physical Exam:    VS:  BP 130/78   Pulse (!) 55   Ht 5' (1.524 m)   Wt 114 lb (51.7 kg)   SpO2 97%   BMI 22.26 kg/m     Wt Readings from Last 3 Encounters:  06/13/24 114 lb (51.7 kg)  06/11/24 114 lb 11.2 oz (52 kg)  05/28/24 122 lb 6.4 oz (55.5 kg)     GEN:  Well nourished, well developed in no acute distress HEENT: Normal NECK:  No carotid bruits CARDIAC: RRR, systolic murmur (4/6), rubs, gallops RESPIRATORY:  Clear to auscultation without rales, wheezing or rhonchi  MUSCULOSKELETAL:  1+ bilateral ankle edema; No deformity  SKIN: Warm and dry NEUROLOGIC:  Alert and oriented x 3 PSYCHIATRIC:  Normal affect   ASSESSMENT:    1. DOE (dyspnea on exertion)   2. Bilateral leg edema    PLAN:    In order of problems listed above:  DOE Bilateral LE edema Echo 09/09/2021: LVEF 60-65%, no RWMA, mild LVH of the basal-septal segment, indeterminate diastolic parameters.  Normal RV.  LA moderately dilated.  Moderate mitral valve regurgitation, moderate mitral stenosis, severe mitral annular calcification.  Tricuspid valve regurgitation is moderate.  Moderate calcification with severe thickening of AV.  Aortic valve regurgitation is moderate-severe, with moderate AV stenosis.  RA pressure 3 mmHg. Reports DOE, increased fatigue, lack of appetite, bilateral LE edema that started after her fall 1 month prior and have continued since then. Denies chest pain, palpitations, diaphoresis, dizziness, near-syncope Patient not currently taking Lasix  Will start on Lasix  20 mg daily X 5 days to be taking with her 10 mEq potassium supplement If her symptoms improve  without subsequent dizziness/hypotension/kidney injury could consider daily Lasix  dosing If her blood pressure remains mildly elevated following the Lasix  (130/78 in clinic today) would consider titration of BP meds. Ordered CBC to evaluate prior anemia Ordered CMP to evaluate albumin and liver function while on statin ED precautions given  Follow up in 1 week for re-evaluation of symptoms following lasix  therapy   Medication Adjustments/Labs and Tests Ordered: Current medicines are reviewed at length with the patient today.  Concerns regarding medicines are outlined above.  Orders Placed This Encounter  Procedures   CBC   Comp Met (CMET)   Basic Metabolic Panel (BMET)   Meds ordered this encounter  Medications   furosemide  (LASIX ) 20 MG tablet    Sig: Take 1 tablet (20 mg total) by mouth daily.    Dispense:  30 tablet    Refill:  1   potassium chloride  (KLOR-CON  M) 10 MEQ tablet    Sig: Take 1 tablet (10 mEq total) by mouth daily. Take only when taking lasix     Dispense:  30 tablet    Refill:  1    Patient Instructions  Medication Instructions:  START Lasix  20mg  Take 1 tablet daily for 5 days Then take as needed START Potassium 10meq Take 1 tablet daily ONLY when TAKING LASIX  *If you need a refill on your cardiac medications before your next appointment, please call your pharmacy*  Lab Work: TODAY-CBC & CMET  1 WEEK BMET (06/20/2024)  If you have labs (blood work) drawn today and your tests are  completely normal, you will receive your results only by: MyChart Message (if you have MyChart) OR A paper copy in the mail If you have any lab test that is abnormal or we need to change your treatment, we will call you to review the results.  Testing/Procedures: NONE ORDERED  Follow-Up: At [facility], you and your health needs are our priority.  As part of our continuing mission to provide you with exceptional heart care, our providers are all part of one team.  This  team includes your primary Cardiologist (physician) and Advanced Practice Providers or APPs (Physician Assistants and Nurse Practitioners) who all work together to provide you with the care you need, when you need it.  Your next appointment:   1 week(s)   Provider:   ANY APP    We recommend signing up for the patient portal called MyChart.  Sign up information is provided on this After Visit Summary.  MyChart is used to connect with patients for Virtual Visits (Telemedicine).  Patients are able to view lab/test results, encounter notes, upcoming appointments, etc.  Non-urgent messages can be sent to your provider as well.   To learn more about what you can do with MyChart, go to [email or web address].   Other Instructions            Signed, Miriam FORBES Shams, NP  06/13/2024 4:43 PM    [facility]

## 2024-06-13 ENCOUNTER — Encounter: Payer: Self-pay | Admitting: General Practice

## 2024-06-13 ENCOUNTER — Ambulatory Visit: Attending: General Practice | Admitting: Emergency Medicine

## 2024-06-13 VITALS — BP 130/78 | HR 55 | Ht 60.0 in | Wt 114.0 lb

## 2024-06-13 DIAGNOSIS — R600 Localized edema: Secondary | ICD-10-CM | POA: Diagnosis not present

## 2024-06-13 DIAGNOSIS — R0609 Other forms of dyspnea: Secondary | ICD-10-CM

## 2024-06-13 MED ORDER — FUROSEMIDE 20 MG PO TABS: 20.0000 mg | ORAL_TABLET | Freq: Every day | ORAL | 1 refills | Status: AC

## 2024-06-13 MED ORDER — POTASSIUM CHLORIDE CRYS ER 10 MEQ PO TBCR: 10.0000 meq | EXTENDED_RELEASE_TABLET | Freq: Every day | ORAL | 1 refills | Status: AC

## 2024-06-13 NOTE — Patient Instructions (Addendum)
 Medication Instructions:  START Lasix  20mg  Take 1 tablet daily for 5 days Then take as needed START Potassium 10meq Take 1 tablet daily ONLY when TAKING LASIX  *If you need a refill on your cardiac medications before your next appointment, please call your pharmacy*  Lab Work: TODAY-CBC & CMET  1 WEEK BMET (06/20/2024)  If you have labs (blood work) drawn today and your tests are completely normal, you will receive your results only by: MyChart Message (if you have MyChart) OR A paper copy in the mail If you have any lab test that is abnormal or we need to change your treatment, we will call you to review the results.  Testing/Procedures: NONE ORDERED  Follow-Up: At [facility], you and your health needs are our priority.  As part of our continuing mission to provide you with exceptional heart care, our providers are all part of one team.  This team includes your primary Cardiologist (physician) and Advanced Practice Providers or APPs (Physician Assistants and Nurse Practitioners) who all work together to provide you with the care you need, when you need it.  Your next appointment:   1 week(s)   Provider:   ANY APP    We recommend signing up for the patient portal called MyChart.  Sign up information is provided on this After Visit Summary.  MyChart is used to connect with patients for Virtual Visits (Telemedicine).  Patients are able to view lab/test results, encounter notes, upcoming appointments, etc.  Non-urgent messages can be sent to your provider as well.   To learn more about what you can do with MyChart, go to [email or web address].   Other Instructions

## 2024-06-14 LAB — CBC
Hematocrit: 34.1 % (ref 34.0–46.6)
Hemoglobin: 11.5 g/dL (ref 11.1–15.9)
MCH: 29.6 pg (ref 26.6–33.0)
MCHC: 33.7 g/dL (ref 31.5–35.7)
MCV: 88 fL (ref 79–97)
Platelets: 217 x10E3/uL (ref 150–450)
RBC: 3.88 x10E6/uL (ref 3.77–5.28)
RDW: 12.8 % (ref 11.7–15.4)
WBC: 6.8 x10E3/uL (ref 3.4–10.8)

## 2024-06-14 LAB — COMPREHENSIVE METABOLIC PANEL WITH GFR
ALT: 19 IU/L (ref 0–32)
AST: 24 IU/L (ref 0–40)
Albumin: 4.2 g/dL (ref 3.6–4.6)
Alkaline Phosphatase: 79 IU/L (ref 48–129)
BUN/Creatinine Ratio: 15 (ref 12–28)
BUN: 13 mg/dL (ref 10–36)
Bilirubin Total: 0.8 mg/dL (ref 0.0–1.2)
CO2: 21 mmol/L (ref 20–29)
Calcium: 9.2 mg/dL (ref 8.7–10.3)
Chloride: 87 mmol/L — AB (ref 96–106)
Creatinine, Ser: 0.87 mg/dL (ref 0.57–1.00)
Globulin, Total: 2.6 g/dL (ref 1.5–4.5)
Glucose: 138 mg/dL — AB (ref 70–99)
Potassium: 3.6 mmol/L (ref 3.5–5.2)
Sodium: 128 mmol/L — AB (ref 134–144)
Total Protein: 6.8 g/dL (ref 6.0–8.5)
eGFR: 63 mL/min/1.73 (ref >59)

## 2024-06-16 ENCOUNTER — Ambulatory Visit: Payer: Self-pay | Admitting: Emergency Medicine

## 2024-06-17 ENCOUNTER — Ambulatory Visit: Admitting: Physician Assistant

## 2024-06-19 ENCOUNTER — Telehealth: Payer: Self-pay

## 2024-06-19 NOTE — Telephone Encounter (Signed)
 Copied from CRM #[id number]. Topic: Clinical - Lab/Test Results >> Jun 19, 2024 12:31 PM Robin [name] wrote: Reason for CRM: patient calling to ask if Dr Charlanne received her results from [facility] and also would like a call from Dr Charlanne to discus her labs   Pt num [phone] (M)

## 2024-06-20 DIAGNOSIS — Z471 Aftercare following joint replacement surgery: Secondary | ICD-10-CM | POA: Diagnosis not present

## 2024-06-20 DIAGNOSIS — Z96652 Presence of left artificial knee joint: Secondary | ICD-10-CM | POA: Diagnosis not present

## 2024-06-20 DIAGNOSIS — R262 Difficulty in walking, not elsewhere classified: Secondary | ICD-10-CM | POA: Diagnosis not present

## 2024-06-20 DIAGNOSIS — M6281 Muscle weakness (generalized): Secondary | ICD-10-CM | POA: Diagnosis not present

## 2024-06-23 NOTE — Telephone Encounter (Signed)
 D/W the patient her Labs

## 2024-06-24 ENCOUNTER — Encounter: Payer: Self-pay | Admitting: Cardiology

## 2024-06-24 ENCOUNTER — Telehealth: Payer: Self-pay | Admitting: Cardiology

## 2024-06-24 NOTE — Telephone Encounter (Signed)
-----   Message from [facility] J sent at 06/13/2024 12:41 PM EDT ----- Patient needs a 1 week f/u with any APP.

## 2024-06-24 NOTE — Telephone Encounter (Signed)
 Patient contacted 4x to schedule 1 week f/u with no success, will send letter.

## 2024-06-25 ENCOUNTER — Other Ambulatory Visit: Payer: Self-pay | Admitting: Internal Medicine

## 2024-06-26 NOTE — Progress Notes (Unsigned)
 Cardiology Office Note    Date:  06/27/2024  ID:  Bao, Coreas [birth year], MRN [id number] PCP:  Charlanne Fredia CROME, MD  Cardiologist:  Alm Clay, MD  Electrophysiologist:  None   Chief Complaint: Follow up for aortic stenosis   History of Present Illness: .    Robin [name] is a 89 y.o. female with visit-pertinent history of moderate to severe aortic stenosis/insufficiency, moderate to severe mitral stenosis/regurgitation, PVCs, hypertension, hyperlipidemia, COPD and depression.  Patient was previously followed by Dr. Blanca for valvular disease but now follows with Dr. Clay. He has known aortic and mitral valve stenosis and regurgitation. Echo in 08/2020 showed LVEF of 60-65% with normal wall motion, moderate LVH of the basal-septal segment, moderate to severe aortic stenosis/regurgitation as well as a myxomatous mitral valve with mild to moderate mitral regurgitation/stenosis. Aortic stenosis was considered severe on prior Echo in 02/2020. Patient was seen by Dr. Clay in 10/2020 at which time she was doing well from a cardiac standpoint. She reported at that time that she had decided that she did not want to move forward with TAVR evaluation and that she did not want any invasive procedures.  Per Dr. Clay as she is not interested in procedure no indication to continue following up studies.  Patient was seen by Dr. Clay on 12/19/2023, noted to experience shortness of breath when walking longer distances, experience occasional leg swelling but not routinely.  She was taking valsartan hydrochlorothiazide for blood pressure management, previously on amlodipine  which was discontinued due to fluctuations in blood pressure.  Patient was last seen in clinic on 06/13/2024 for follow-up.  Patient reported having recently tripped over an elliptical machine, fell and fractured some ribs on 05/09/2024.  She reported that since her fall she had noted increased dyspnea on exertion and a  decrease in energy, also repeated an increase in bilateral lower extremity edema.  Patient stated that she had been tried on 20 mg of Lasix  for 3 days without much improvement in symptoms.  Patient was started on Lasix  20 mg daily for 5 days to take with 10 mL equivalent potassium supplement.  1 week follow-up was recommended.  Today she presents for follow-up with her care giver regarding increased dyspnea on exertion and lower extremity edema.  She reports that she has been doing well, denies any chest pain, reports that her breathing is stable.  She reports that her increased lower extremity edema resolved a few days after her last office visit.  She reports after her lab work was drawn and there was difficulty obtaining blood she felt that she did not need to take increased Lasix  and did not start the medication at that time.  She reports that she takes Lasix  only as needed, has not taken any consistent dosing.  She feels that her breathing has been stable and has not significantly changed, feels that her lower extremity edema is well-controlled.  She denies any palpitations, orthopnea or PND.  She resides at friend's home and walks with a walker. Labwork independently reviewed: 06/13/2024: Sodium 128, Tessman 3.6, creatinine 0.87, AST 24, ALT 19 ROS: .   Today she denies chest pain, fatigue, palpitations, melena, hematuria, hemoptysis, diaphoresis, weakness, presyncope, syncope, orthopnea, and PND.  All other systems are reviewed and otherwise negative. Studies Reviewed: SABRA   EKG:  EKG is not ordered today.  CV Studies: Cardiac studies reviewed are outlined and summarized above. Otherwise please see EMR for full report. Cardiac Studies & Procedures  ______________________________________________________________________________________________     ECHOCARDIOGRAM  ECHOCARDIOGRAM COMPLETE 09/09/2021  Narrative ECHOCARDIOGRAM REPORT    Patient Name:   KARRINGTON MCCRAVY Date of Exam:  09/09/2021 Medical Rec #:  [id number]       Height:       62.0 in Accession #:    [id number]      Weight:       134.0 lb Date of Birth:  [birth year]       BSA:          1.612 m Patient Age:    89 years        BP:           124/60 mmHg Patient Gender: F               HR:           77 bpm. Exam Location:  [address]  Procedure: 2D Echo, Cardiac Doppler and Color Doppler  Indications:    I35.0 Nonrheumatic aortic (valve) stenosis  History:        Patient has prior history of Echocardiogram examinations, most recent 09/06/2020. COPD, Arrythmias:PVC; Risk Factors:Hypertension and Dyslipidemia. Chronic back pain. Bilateral leg edema.  Sonographer:    Jon Hacker RCS Referring Phys: [phone] TESSA N CONTE   IMPRESSIONS   1. Left ventricular ejection fraction, by estimation, is 60 to 65%. The left ventricle has normal function. The left ventricle has no regional wall motion abnormalities. There is mild left ventricular hypertrophy of the basal-septal segment. Left ventricular diastolic parameters are indeterminate. Elevated left atrial pressure. 2. Right ventricular systolic function is normal. The right ventricular size is normal. There is normal pulmonary artery systolic pressure. 3. Left atrial size was moderately dilated. 4. The mitral valve is degenerative. Moderate mitral valve regurgitation. Moderate mitral stenosis. The mean mitral valve gradient is 9.1 mmHg with average heart rate of 77 bpm. Severe mitral annular calcification. 5. Tricuspid valve regurgitation is moderate. 6. The aortic valve is tricuspid. There is moderate calcification of the aortic valve. There is severe thickening of the aortic valve. Aortic valve regurgitation is moderate to severe. Moderate aortic valve stenosis. 7. The inferior vena cava is normal in size with greater than 50% respiratory variability, suggesting right atrial pressure of 3 mmHg.  Comparison(s): No significant change from prior study. Prior  images reviewed side by side.  FINDINGS Left Ventricle: Left ventricular ejection fraction, by estimation, is 60 to 65%. The left ventricle has normal function. The left ventricle has no regional wall motion abnormalities. The left ventricular internal cavity size was normal in size. There is mild left ventricular hypertrophy of the basal-septal segment. Left ventricular diastolic function could not be evaluated due to mitral stenosis. Left ventricular diastolic parameters are indeterminate. Elevated left atrial pressure.  Right Ventricle: The right ventricular size is normal. No increase in right ventricular wall thickness. Right ventricular systolic function is normal. There is normal pulmonary artery systolic pressure. The tricuspid regurgitant velocity is 2.86 m/s, and with an assumed right atrial pressure of 3 mmHg, the estimated right ventricular systolic pressure is 35.7 mmHg.  Left Atrium: Left atrial size was moderately dilated.  Right Atrium: Right atrial size was normal in size.  Pericardium: There is no evidence of pericardial effusion.  Mitral Valve: The mitral valve is degenerative in appearance. There is mild thickening of the mitral valve leaflet(s). There is mild calcification of the mitral valve leaflet(s). Severe mitral annular calcification. Moderate mitral valve regurgitation, with centrally-directed jet. Moderate mitral valve  stenosis. The mean mitral valve gradient is 9.1 mmHg with average heart rate of 77 bpm.  Tricuspid Valve: The tricuspid valve is normal in structure. Tricuspid valve regurgitation is moderate.  Aortic Valve: The aortic valve is tricuspid. There is moderate calcification of the aortic valve. There is severe thickening of the aortic valve. Aortic valve regurgitation is moderate to severe. Aortic regurgitation PHT measures 207 msec. Moderate aortic stenosis is present. Aortic valve mean gradient measures 25.0 mmHg. Aortic valve peak gradient measures 42.5  mmHg. Aortic valve area, by VTI measures 1.09 cm.  Pulmonic Valve: The pulmonic valve was normal in structure. Pulmonic valve regurgitation is trivial.  Aorta: The aortic root and ascending aorta are structurally normal, with no evidence of dilitation.  Venous: The inferior vena cava is normal in size with greater than 50% respiratory variability, suggesting right atrial pressure of 3 mmHg.  IAS/Shunts: No atrial level shunt detected by color flow Doppler.   LEFT VENTRICLE PLAX 2D LVIDd:         3.60 cm   Diastology LVIDs:         2.50 cm   LV e' medial:    7.35 cm/s LV PW:         1.00 cm   LV E/e' medial:  21.5 LV IVS:        1.50 cm   LV e' lateral:   9.75 cm/s LVOT diam:     2.22 cm   LV E/e' lateral: 16.2 LV SV:         89 LV SV Index:   55 LVOT Area:     3.87 cm   RIGHT VENTRICLE RV Basal diam:  2.60 cm RV S prime:     10.50 cm/s TAPSE (M-mode): 2.6 cm RVSP:           35.7 mmHg  LEFT ATRIUM             Index        RIGHT ATRIUM           Index LA diam:        4.60 cm 2.85 cm/m   RA Pressure: 3.00 mmHg LA Vol (A2C):   73.5 ml 45.58 ml/m  RA Area:     13.90 cm LA Vol (A4C):   58.2 ml 36.09 ml/m  RA Volume:   33.60 ml  20.84 ml/m LA Biplane Vol: 66.5 ml 41.24 ml/m AORTIC VALVE AV Area (Vmax):    1.10 cm AV Area (Vmean):   1.00 cm AV Area (VTI):     1.09 cm AV Vmax:           326.00 cm/s AV Vmean:          233.500 cm/s AV VTI:            0.814 m AV Peak Grad:      42.5 mmHg AV Mean Grad:      25.0 mmHg LVOT Vmax:         92.90 cm/s LVOT Vmean:        60.600 cm/s LVOT VTI:          0.229 m LVOT/AV VTI ratio: 0.28 AI PHT:            207 msec  AORTA Ao Root diam: 2.80 cm Ao Asc diam:  3.10 cm  MITRAL VALVE                TRICUSPID VALVE MV Area (PHT): 3.03 cm  TR Peak grad:   32.7 mmHg MV Mean grad:  9.1 mmHg     TR Vmax:        286.00 cm/s MV Decel Time: 250 msec     Estimated RAP:  3.00 mmHg MR Peak grad: 155.8 mmHg    RVSP:           35.7  mmHg MR Mean grad: 97.0 mmHg MR Vmax:      624.00 cm/s   SHUNTS MR Vmean:     462.0 cm/s    Systemic VTI:  0.23 m MV E velocity: 158.00 cm/s  Systemic Diam: 2.22 cm MV A velocity: 150.00 cm/s MV E/A ratio:  1.05  Mihai Croitoru MD Electronically signed by Jerel Balding MD Signature Date/Time: 09/09/2021/4:16:38 PM    Final    MONITORS  CARDIAC EVENT MONITOR 07/16/2013       ______________________________________________________________________________________________       Current Reported Medications:.    Current Meds  Medication Sig   acetaminophen  (TYLENOL ) 325 MG tablet Take 2 tablets (650 mg total) by mouth every 6 (six) hours as needed for moderate pain (pain score 4-6).   albuterol  (VENTOLIN  HFA) 108 (90 Base) MCG/ACT inhaler Inhale 2 puffs into the lungs every 6 (six) hours as needed for wheezing or shortness of breath.   aspirin  81 MG tablet Take 81 mg by mouth daily.   furosemide  (LASIX ) 20 MG tablet Take 1 tablet (20 mg total) by mouth daily.   lovastatin (MEVACOR) 40 MG tablet TAKE 1 TABLET BY MOUTH EVERY DAY   methocarbamol  (ROBAXIN ) 500 MG tablet Take 0.5 tablets (250 mg total) by mouth at bedtime as needed for muscle spasms.   pantoprazole  (PROTONIX ) 20 MG tablet Take 1 tablet (20 mg total) by mouth 2 (two) times daily.   potassium chloride  (KLOR-CON  M) 10 MEQ tablet Take 1 tablet (10 mEq total) by mouth daily. Take only when taking lasix    Probiotic Product (PROBIOTIC DAILY PO) Take 1 capsule by mouth daily.   sertraline  (ZOLOFT ) 100 MG tablet TAKE 1 TABLET BY MOUTH EVERY DAY   tiotropium (SPIRIVA  HANDIHALER) 18 MCG inhalation capsule Place 1 capsule (18 mcg total) into inhaler and inhale daily.   valsartan-hydrochlorothiazide (DIOVAN-HCT) 320-12.5 MG tablet TAKE 1 TABLET BY MOUTH EVERY DAY    Physical Exam:    VS:  BP 110/62 (BP Location: Right Arm, Patient Position: Sitting, Cuff Size: Normal)   Pulse 79   Ht 5' 3 (1.6 m)   Wt 115 lb (52.2 kg)    SpO2 95%   BMI 20.37 kg/m    Wt Readings from Last 3 Encounters:  06/27/24 115 lb (52.2 kg)  06/13/24 114 lb (51.7 kg)  06/11/24 114 lb 11.2 oz (52 kg)    GEN: Well nourished, well developed in no acute distress NECK: No JVD; No carotid bruits CARDIAC: RRR, 3/6 systolic murmur, no rubs or gallops RESPIRATORY:  Clear to auscultation without rales, wheezing or rhonchi  ABDOMEN: Soft, non-tender, non-distended EXTREMITIES:  Mild bilateral ankle edema; No acute deformity     Asessement and Plan:.    Aortic stenosis/regurgitation/ mitral stensosis/regurgitation: Echo 09/09/2021: LVEF 60-65%, no RWMA, mild LVH of the basal-septal segment, indeterminate diastolic parameters. Normal RV. LA moderately dilated. Moderate mitral valve regurgitation, moderate mitral stenosis, severe mitral annular calcification. Tricuspid valve regurgitation is moderate. Moderate calcification with severe thickening of AV. Aortic valve regurgitation is moderate-severe, with moderate AV stenosis.  At last office visit patient noted increased lower extremity edema, was encouraged to  take Lasix  20 mg for 5 days.  Patient today reports that her increased lower extremity edema completely resolved, she did not take any additional doses of Lasix .  She reports that her breathing is stable.  Encouraged patient to use Lasix  only as needed for increased lower extremity edema.  Patient notes that she is not interested in any invasive procedures such as TAVR.  Will not pursue echocardiogram at this time.  Reviewed ED precautions.  Continue Lasix  20 mg daily as needed. Check BMET.   PVCs: Patient with known history of PVCs however beta-blocker previously discontinued in setting of increased fatigue and wheezing.  Today she reports that she is doing well, denies any increased palpitations.  She will continue to monitor.  Hypertension: Blood pressure today 110/62.  Encouraged patient to monitor at home, she denies any increased dizziness,  lightheadedness, presyncope or syncope.  Continue current antihypertensive regimen.  Hyperlipidemia: Last lipid profile in 11/15/2023 indicated total cholesterol 150, HDL 53, triglycerides 101 and LDL 79.  On lovastatin 40 mg daily.  COPD: Patient reports that her breathing is stable, denies any recent worsening.  On Spiriva .   Disposition: F/u with Dr. Anner in six months per patient request.   Signed, Shakira Los D Dominic Mahaney, NP

## 2024-06-27 ENCOUNTER — Ambulatory Visit: Attending: Cardiology | Admitting: Cardiology

## 2024-06-27 ENCOUNTER — Encounter: Payer: Self-pay | Admitting: Cardiology

## 2024-06-27 VITALS — BP 110/62 | HR 79 | Ht 63.0 in | Wt 115.0 lb

## 2024-06-27 DIAGNOSIS — I352 Nonrheumatic aortic (valve) stenosis with insufficiency: Secondary | ICD-10-CM

## 2024-06-27 DIAGNOSIS — R0609 Other forms of dyspnea: Secondary | ICD-10-CM

## 2024-06-27 DIAGNOSIS — I340 Nonrheumatic mitral (valve) insufficiency: Secondary | ICD-10-CM | POA: Diagnosis not present

## 2024-06-27 DIAGNOSIS — I350 Nonrheumatic aortic (valve) stenosis: Secondary | ICD-10-CM | POA: Diagnosis not present

## 2024-06-27 DIAGNOSIS — I493 Ventricular premature depolarization: Secondary | ICD-10-CM

## 2024-06-27 DIAGNOSIS — R600 Localized edema: Secondary | ICD-10-CM

## 2024-06-27 DIAGNOSIS — Z79899 Other long term (current) drug therapy: Secondary | ICD-10-CM

## 2024-06-27 DIAGNOSIS — I10 Essential (primary) hypertension: Secondary | ICD-10-CM

## 2024-06-27 NOTE — Patient Instructions (Signed)
 Medication Instructions:  Your physician recommends that you continue on your current medications as directed. Please refer to the Current Medication list given to you today.   Labwork: BMET to be completed today  Testing/Procedures: None  Follow-Up: Your physician recommends that you schedule a follow-up appointment in: 6 months  Any Other Special Instructions Will Be Listed Below (If Applicable). Thank you for choosing [facility]!     If you need a refill on your cardiac medications before your next appointment, please call your pharmacy.

## 2024-06-28 LAB — BASIC METABOLIC PANEL WITH GFR
BUN/Creatinine Ratio: 17 (ref 12–28)
BUN: 16 mg/dL (ref 10–36)
CO2: 25 mmol/L (ref 20–29)
Calcium: 9 mg/dL (ref 8.7–10.3)
Chloride: 95 mmol/L — ABNORMAL LOW (ref 96–106)
Creatinine, Ser: 0.95 mg/dL (ref 0.57–1.00)
Glucose: 110 mg/dL — AB (ref 70–99)
Potassium: 3.8 mmol/L (ref 3.5–5.2)
Sodium: 134 mmol/L (ref 134–144)
eGFR: 57 mL/min/1.73 — AB (ref >59)

## 2024-06-29 ENCOUNTER — Ambulatory Visit: Payer: Self-pay | Admitting: Cardiology

## 2024-07-07 ENCOUNTER — Ambulatory Visit: Admitting: Cardiology

## 2024-08-13 ENCOUNTER — Encounter: Payer: Self-pay | Admitting: Internal Medicine

## 2024-08-13 ENCOUNTER — Non-Acute Institutional Stay: Payer: Self-pay | Admitting: Internal Medicine

## 2024-08-13 VITALS — BP 138/76 | HR 88 | Temp 98.7°F | Ht 63.0 in | Wt 113.6 lb

## 2024-08-13 DIAGNOSIS — M545 Low back pain, unspecified: Secondary | ICD-10-CM

## 2024-08-13 DIAGNOSIS — I10 Essential (primary) hypertension: Secondary | ICD-10-CM | POA: Diagnosis not present

## 2024-08-13 DIAGNOSIS — K219 Gastro-esophageal reflux disease without esophagitis: Secondary | ICD-10-CM

## 2024-08-13 DIAGNOSIS — F339 Major depressive disorder, recurrent, unspecified: Secondary | ICD-10-CM | POA: Diagnosis not present

## 2024-08-13 DIAGNOSIS — J410 Simple chronic bronchitis: Secondary | ICD-10-CM | POA: Diagnosis not present

## 2024-08-13 DIAGNOSIS — G8929 Other chronic pain: Secondary | ICD-10-CM | POA: Diagnosis not present

## 2024-08-13 DIAGNOSIS — I351 Nonrheumatic aortic (valve) insufficiency: Secondary | ICD-10-CM | POA: Diagnosis not present

## 2024-08-13 DIAGNOSIS — R600 Localized edema: Secondary | ICD-10-CM | POA: Diagnosis not present

## 2024-08-13 DIAGNOSIS — M810 Age-related osteoporosis without current pathological fracture: Secondary | ICD-10-CM

## 2024-08-13 MED ORDER — FLUTICASONE FUROATE-VILANTEROL 200-25 MCG/ACT IN AEPB: 1.0000 | INHALATION_SPRAY | Freq: Every day | RESPIRATORY_TRACT | 11 refills | Status: AC

## 2024-08-13 NOTE — Patient Instructions (Addendum)
 Labs will be done on December 08, 2024, at [facility] at 7:45 am

## 2024-08-15 ENCOUNTER — Telehealth: Payer: Self-pay

## 2024-08-15 NOTE — Telephone Encounter (Signed)
 Copied from CRM #[id number]. Topic: Clinical - Prescription Issue >> Aug 15, 2024  1:05 PM Mercer PEDLAR wrote: Reason for CRM: Patient is calling regarding fluticasone  furoate-vilanterol (BREO ELLIPTA ) 200-25 MCG/ACT AEPB . She stated that her insurance does nit cover it and would like to know if there are any alternatives that would be covered.   [id number] [address], [place] - [address] [place] [address] Phone: [phone] Fax: [phone]

## 2024-08-15 NOTE — Telephone Encounter (Signed)
Please advise on possible alternative? 

## 2024-08-18 MED ORDER — PANTOPRAZOLE SODIUM 20 MG PO TBEC: 20.0000 mg | DELAYED_RELEASE_TABLET | Freq: Every day | ORAL | Status: AC | PRN

## 2024-08-18 MED ORDER — SENNA-DOCUSATE SODIUM 8.6-50 MG PO TABS: 2.0000 | ORAL_TABLET | Freq: Every day | ORAL | Status: AC

## 2024-08-18 NOTE — Telephone Encounter (Signed)
 Spoke with patient about the co-pay of inhaler patient aware and inagreement.

## 2024-08-18 NOTE — Telephone Encounter (Signed)
 I called pharmacy and they said her Copay is $47 for this inhaler and this is the best price for it. It is covered by her [employer]

## 2024-08-18 NOTE — Progress Notes (Signed)
 "  Location:  [facility] Curator of Service:   Clinic  Provider:   Code Status: DNR Goals of Care:     01/10/2024    4:09 PM  Advanced Directives  Does Patient Have a Medical Advance Directive? No     Chief Complaint  Patient presents with   Follow-up    6 month follow up   Constipation    Patient has been having constipation since Saturday. Patient reports she has been using a product OTC    HPI: Patient is a 89 y.o. female seen today for an acute visit for Follow up   [place] lives [place] in [facility]  Has h/o Severe AS with AR  Depression and anxiety Chronic back pain  Alternating diarrhea and constipation questionable IBS.  Has seen GI. COPD HLD, hypertension  Uses [employer] Power chair for long distance  Had Rib fracture after fall in 05/2024  Was seen By Cardiology for DOE It improved with few days of Lasix   Further work up of Aortic Stenosis Deferred at this time due to her Goals of care and refusal for  intervention  Discussed the use of AI scribe software for clinical note transcription with the patient, who gave verbal consent to proceed.   Bronchitis  She experiences shortness of breath on waking and at times during the day. Her caregiver hears wheezing that she does not always notice. She uses Spiriva  daily and ProAir  (albuterol ) as needed,   . She has fatigue and dry mouth and is not coughing up sputum. She is not using Lasix  recently and takes Zyrtec for allergies.  She has back pain that is tolerable at rest and worsens with brief standing or activity. She uses Tylenol  as needed. She is not taking Robaxin  now.  She has significant constipation managed with Senna. After taking two Senna tablets she had a bowel movement.  She has persistent dry mouth while taking Zyrtec for allergies and uses a [facility] spray.  She received a flu shot but not COVID or RSV vaccines. She takes pantoprazole  as needed for gastrointestinal symptoms, Lasix  as needed for  swelling, and Zoloft  regularly.         Past Medical History:  Diagnosis Date   Arthritis    COPD (chronic obstructive pulmonary disease) (HCC)    Depression    Diverticulosis    GERD (gastroesophageal reflux disease)    Hyperlipemia    Hypertension    Irregular heart beat 2014   PVCs   Moderate to severe aortic insufficiency 07/2019   Echo (03/08/2020): Aortic stenosis is now severe with mean gradient 60 mmHg. Aortic regurgitation is moderate to severe.   Osteoporosis    Severe aortic stenosis 06/11/2019   Echo 06/2019: EF 60-65%. Mild basal septal LVH, GR 2 DD. Mod LA dilation.- High LVEDP. Severe AI, Mod AS-peak gradient 43 mmHg, mean 28 mmHg;; 02/2020: Mean AoV Gradient 60 mmHg with Mod AI. EF 70-755.  Mod LVH. Mod LA dilation. Mod MR.    Past Surgical History:  Procedure Laterality Date   ABDOMINAL HYSTERECTOMY     both appy and hyst done together   APPENDECTOMY     CATARACT EXTRACTION     TRANSTHORACIC ECHOCARDIOGRAM  09/09/2021   : EF 60 to 65%.  Normal LV size and function.  Mild LVH.  Indeterminate D Fxn-elevated LAP with Mod LA dilation.  Severe MAC with moderate MR/MS (mean gradient 9.1 mmHg).  Moderate AoV calcification with severe thickening-> Mod  to Severe AS/AI; no change.   TRANSTHORACIC ECHOCARDIOGRAM  09/06/2020   EF 60-65%.  Normal LV fxn.  Mod Conc LVH of basal septal segment.  Indeterminate filling pattern (likely Gr2DD w/ Mod LA dilation).  Nl RV size and function.  Mildly increased PAP ~ 39.5 mmHg.  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  Mild-Mod TR.  Severe AoV Ca++ -> Mod-Severe AI & AS (mean gradient 33 mmHg) - ? lower b.c lower LVEF?    Allergies[1]  Outpatient Encounter Medications as of 08/13/2024  Medication Sig   acetaminophen  (TYLENOL ) 325 MG tablet Take 2 tablets (650 mg total) by mouth every 6 (six) hours as needed for moderate pain (pain score 4-6).   albuterol  (VENTOLIN  HFA) 108 (90 Base) MCG/ACT inhaler Inhale 2  puffs into the lungs every 6 (six) hours as needed for wheezing or shortness of breath.   aspirin  81 MG tablet Take 81 mg by mouth daily.   fluticasone  furoate-vilanterol (BREO ELLIPTA ) 200-25 MCG/ACT AEPB Inhale 1 puff into the lungs daily.   furosemide  (LASIX ) 20 MG tablet Take 1 tablet (20 mg total) by mouth daily. (Patient taking differently: Take 20 mg by mouth as needed for fluid or edema.)   lovastatin (MEVACOR) 40 MG tablet TAKE 1 TABLET BY MOUTH EVERY DAY   methocarbamol  (ROBAXIN ) 500 MG tablet Take 0.5 tablets (250 mg total) by mouth at bedtime as needed for muscle spasms.   pantoprazole  (PROTONIX ) 20 MG tablet Take 1 tablet (20 mg total) by mouth 2 (two) times daily.   potassium chloride  (KLOR-CON  M) 10 MEQ tablet Take 1 tablet (10 mEq total) by mouth daily. Take only when taking lasix    Probiotic Product (PROBIOTIC DAILY PO) Take 1 capsule by mouth daily.   sertraline  (ZOLOFT ) 100 MG tablet TAKE 1 TABLET BY MOUTH EVERY DAY   tiotropium (SPIRIVA  HANDIHALER) 18 MCG inhalation capsule Place 1 capsule (18 mcg total) into inhaler and inhale daily.   valsartan-hydrochlorothiazide (DIOVAN-HCT) 320-12.5 MG tablet TAKE 1 TABLET BY MOUTH EVERY DAY   No facility-administered encounter medications on file as of 08/13/2024.    Review of Systems:  Review of Systems  Constitutional:  Negative for activity change and appetite change.  HENT: Negative.    Respiratory:  Positive for shortness of breath. Negative for cough.   Cardiovascular:  Negative for leg swelling.  Gastrointestinal:  Negative for constipation.  Genitourinary: Negative.   Musculoskeletal:  Positive for back pain and gait problem. Negative for arthralgias and myalgias.  Skin: Negative.   Neurological:  Negative for dizziness and weakness.  Psychiatric/Behavioral:  Negative for confusion, dysphoric mood and sleep disturbance.     Health Maintenance  Topic Date Due   COVID-19 Vaccine (5 - 2025-26 season) 08/29/2024  (Originally 04/28/2024)   Medicare Annual Wellness (AWV)  01/13/2025   DTaP/Tdap/Td (3 - Td or Tdap) 03/19/2034   Pneumococcal Vaccine: 50+ Years  Completed   Influenza Vaccine  Completed   Bone Density Scan  Completed   Meningococcal B Vaccine  Aged Out   Zoster Vaccines- Shingrix  Discontinued    Physical Exam: Vitals:   08/13/24 1421  BP: 138/76  Pulse: 88  Temp: 98.7 F (37.1 C)  SpO2: 95%  Weight: 113 lb 9.6 oz (51.5 kg)  Height: 5' 3 (1.6 m)   Body mass index is 20.12 kg/m. Physical Exam Vitals reviewed.  Constitutional:      Appearance: Normal appearance.  HENT:  Head: Normocephalic.     Nose: Nose normal.     Mouth/Throat:     Mouth: Mucous membranes are moist.     Pharynx: Oropharynx is clear.  Eyes:     Pupils: Pupils are equal, round, and reactive to light.  Cardiovascular:     Rate and Rhythm: Normal rate and regular rhythm.     Pulses: Normal pulses.     Heart sounds: Murmur heard.  Pulmonary:     Effort: Pulmonary effort is normal.     Breath sounds: Normal breath sounds. No wheezing.  Abdominal:     General: Abdomen is flat. Bowel sounds are normal.     Palpations: Abdomen is soft.  Musculoskeletal:        General: No swelling.     Cervical back: Neck supple.  Skin:    General: Skin is warm.  Neurological:     General: No focal deficit present.     Mental Status: She is alert and oriented to person, place, and time.  Psychiatric:        Mood and Affect: Mood normal.        Thought Content: Thought content normal.     Labs reviewed: Basic Metabolic Panel: Recent Labs    11/15/23 0850 06/13/24 1259 06/27/24 1204  NA 138 128* 134  K 3.8 3.6 3.8  CL 100 87* 95*  CO2 28 21 25   GLUCOSE 101* 138* 110*  BUN 17 13 16   CREATININE 0.90 0.87 0.95  CALCIUM 8.7 9.2 9.0  TSH 1.83  --   --    Liver Function Tests: Recent Labs    09/27/23 0830 11/15/23 0850 06/13/24 1259  AST 18 17 24   ALT 13 10 19   ALKPHOS  --   --  79  BILITOT 0.7  0.6 0.8  PROT 6.2 6.5 6.8  ALBUMIN  --   --  4.2   Recent Labs    09/27/23 0830  LIPASE 20   No results for input(s): AMMONIA in the last 8760 hours. CBC: Recent Labs    09/27/23 0830 11/15/23 0850 06/13/24 1259  WBC 4.0 5.3 6.8  NEUTROABS 2,436 3,413  --   HGB 11.9 10.6* 11.5  HCT 34.9* 31.8* 34.1  MCV 88.6 88.1 88  PLT 193 225 217   Lipid Panel: Recent Labs    11/15/23 0850  CHOL 150  HDL 53  LDLCALC 79  TRIG 101  CHOLHDL 2.8   No results found for: HGBA1C  Procedures since last visit: No results found.  Assessment/Plan 1. Simple chronic bronchitis (HCC) (Primary) Start on Breo Stop Spiriva  Can use Ventolin  PRN 2. Severe aortic insufficiency and AS No Intervention or further Echo  3. Bilateral leg edema Uses Lasix  PRN  4. Chronic bilateral low back pain without sciatica Only Tylenol   5. Gastroesophageal reflux disease, unspecified whether esophagitis present Protonix  PRN  6. Depression, recurrent Zoloft   7. Essential hypertension On Diovan  8 Constipation Take Senna Plus 2 tablets     Labs/tests ordered:  * No order type specified * Next appt:  12/10/2024     [1]  Allergies Allergen Reactions   Fosamax [Alendronate Sodium]     Poor healing to mouth wound   Augmentin [Amoxicillin-Pot Clavulanate] Nausea Only    DID THE REACTION INVOLVE: Swelling of the face/tongue/throat, SOB, or low BP? No Sudden or severe rash/hives, skin peeling, or the inside of the mouth or nose? No Did it require medical treatment? No When did it last happen?  If all above answers are NO, may proceed with cephalosporin use.    Hydrocodone Nausea And Vomiting   Mobic [Meloxicam] Rash   Sulfonamide Derivatives Rash   "

## 2024-08-31 ENCOUNTER — Other Ambulatory Visit: Payer: Self-pay | Admitting: Internal Medicine

## 2024-09-05 ENCOUNTER — Other Ambulatory Visit: Payer: Self-pay | Admitting: Internal Medicine

## 2024-09-05 NOTE — Telephone Encounter (Signed)
 Per OV Dated 08/13/24: Assessment/Plan 1. Simple chronic bronchitis (HCC) (Primary) Start on Breo Stop Spiriva  Can use Ventolin  PRN

## 2024-09-17 ENCOUNTER — Encounter: Payer: Self-pay | Admitting: Internal Medicine

## 2024-09-19 NOTE — Progress Notes (Signed)
 Patient walked in the clinic to show her Leg wound She Hit her leg on cabinet and sustained a skin tear. Was seen By the nurse and is now getting Foam dressing Some redness around the wound Not tender. She did have some swelling D/w the Nurse will continue Dressing 2/week Told her to Take Lasix  for leg swelling and Elevate She will see me next week to follow up

## 2024-09-24 ENCOUNTER — Encounter: Payer: Self-pay | Admitting: Internal Medicine

## 2024-09-24 ENCOUNTER — Non-Acute Institutional Stay: Admitting: Internal Medicine

## 2024-09-24 VITALS — BP 120/80 | HR 66 | Temp 98.5°F | Resp 18 | Ht 63.0 in | Wt 107.5 lb

## 2024-09-24 DIAGNOSIS — R600 Localized edema: Secondary | ICD-10-CM | POA: Diagnosis not present

## 2024-09-24 DIAGNOSIS — S81801D Unspecified open wound, right lower leg, subsequent encounter: Secondary | ICD-10-CM | POA: Diagnosis not present

## 2024-09-24 NOTE — Progress Notes (Signed)
 "  Location: [facility] Scientist of Service:  Clinic ([id number])  Provider:   Code Status:  Goals of Care:     01/10/2024    4:09 PM  Advanced Directives  Does Patient Have a Medical Advance Directive? No     Chief Complaint  Patient presents with   Medical Management of Chronic Issues    Right Leg Wound follow-up per Dr. Charlanne request.     HPI: Patient is a 89 y.o. female seen today for an acute visit for Follow up of her Right leg Wound   Sustained Skin tear few weeks ago Facility nurse is doing dressing 2/week Last week I told her to take Lasix  as her leg was swollen  She came for follow-up today Her leg looks much smaller edema is better the wound is also dry.  Some redness around the wound persist but no discharge nontender  Patient is now taking Breo for her chronic bronchitis and feels much better.  Spiriva  is stopped     Past Medical History:  Diagnosis Date   Arthritis    COPD (chronic obstructive pulmonary disease) (HCC)    Depression    Diverticulosis    GERD (gastroesophageal reflux disease)    Hyperlipemia    Hypertension    Irregular heart beat 2014   PVCs   Moderate to severe aortic insufficiency 07/2019   Echo (03/08/2020): Aortic stenosis is now severe with mean gradient 60 mmHg. Aortic regurgitation is moderate to severe.   Osteoporosis    Severe aortic stenosis 06/11/2019   Echo 06/2019: EF 60-65%. Mild basal septal LVH, GR 2 DD. Mod LA dilation.- High LVEDP. Severe AI, Mod AS-peak gradient 43 mmHg, mean 28 mmHg;; 02/2020: Mean AoV Gradient 60 mmHg with Mod AI. EF 70-755.  Mod LVH. Mod LA dilation. Mod MR.    Past Surgical History:  Procedure Laterality Date   ABDOMINAL HYSTERECTOMY     both appy and hyst done together   APPENDECTOMY     CATARACT EXTRACTION     TRANSTHORACIC ECHOCARDIOGRAM  09/09/2021   : EF 60 to 65%.  Normal LV size and function.  Mild LVH.  Indeterminate D Fxn-elevated LAP with Mod LA dilation.  Severe MAC with  moderate MR/MS (mean gradient 9.1 mmHg).  Moderate AoV calcification with severe thickening-> Mod to Severe AS/AI; no change.   TRANSTHORACIC ECHOCARDIOGRAM  09/06/2020   EF 60-65%.  Normal LV fxn.  Mod Conc LVH of basal septal segment.  Indeterminate filling pattern (likely Gr2DD w/ Mod LA dilation).  Nl RV size and function.  Mildly increased PAP ~ 39.5 mmHg.  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  Mild-Mod TR.  Severe AoV Ca++ -> Mod-Severe AI & AS (mean gradient 33 mmHg) - ? lower b.c lower LVEF?    Allergies[1]  Outpatient Encounter Medications as of 09/24/2024  Medication Sig   acetaminophen  (TYLENOL ) 325 MG tablet Take 2 tablets (650 mg total) by mouth every 6 (six) hours as needed for moderate pain (pain score 4-6).   albuterol  (VENTOLIN  HFA) 108 (90 Base) MCG/ACT inhaler Inhale 2 puffs into the lungs every 6 (six) hours as needed for wheezing or shortness of breath.   aspirin  81 MG tablet Take 81 mg by mouth daily.   fluticasone  furoate-vilanterol (BREO ELLIPTA ) 200-25 MCG/ACT AEPB Inhale 1 puff into the lungs daily.   furosemide  (LASIX ) 20 MG tablet Take 1 tablet (20 mg total) by mouth daily. (  Patient taking differently: Take 20 mg by mouth as needed for fluid or edema.)   lovastatin (MEVACOR) 40 MG tablet TAKE 1 TABLET BY MOUTH EVERY DAY   pantoprazole  (PROTONIX ) 20 MG tablet Take 1 tablet (20 mg total) by mouth daily as needed.   potassium chloride  (KLOR-CON  M) 10 MEQ tablet Take 1 tablet (10 mEq total) by mouth daily. Take only when taking lasix    Probiotic Product (PROBIOTIC DAILY PO) Take 1 capsule by mouth daily.   sennosides-docusate sodium  (SENOKOT-S) 8.6-50 MG tablet Take 2 tablets by mouth daily.   sertraline  (ZOLOFT ) 100 MG tablet TAKE 1 TABLET BY MOUTH EVERY DAY   valsartan-hydrochlorothiazide (DIOVAN-HCT) 320-12.5 MG tablet TAKE 1 TABLET BY MOUTH EVERY DAY   [DISCONTINUED] tiotropium (SPIRIVA  HANDIHALER) 18 MCG inhalation capsule Place 1  capsule (18 mcg total) into inhaler and inhale daily.   No facility-administered encounter medications on file as of 09/24/2024.    Review of Systems:  Review of Systems  Constitutional:  Negative for activity change and appetite change.  HENT: Negative.    Respiratory:  Negative for cough and shortness of breath.   Cardiovascular:  Negative for leg swelling.  Gastrointestinal:  Negative for constipation.  Genitourinary: Negative.   Musculoskeletal:  Positive for gait problem. Negative for arthralgias and myalgias.  Skin:  Positive for wound.  Neurological:  Negative for dizziness and weakness.  Psychiatric/Behavioral:  Negative for confusion, dysphoric mood and sleep disturbance.     Health Maintenance  Topic Date Due   COVID-19 Vaccine (5 - 2025-26 season) 04/28/2024   Medicare Annual Wellness (AWV)  01/13/2025   DTaP/Tdap/Td (3 - Td or Tdap) 03/19/2034   Pneumococcal Vaccine: 50+ Years  Completed   Influenza Vaccine  Completed   Bone Density Scan  Completed   Meningococcal B Vaccine  Aged Out   Zoster Vaccines- Shingrix  Discontinued    Physical Exam: Vitals:   09/24/24 1120  BP: 120/80  Pulse: 66  Resp: 18  Temp: 98.5 F (36.9 C)  SpO2: 94%  Weight: 107 lb 8 oz (48.8 kg)  Height: 5' 3 (1.6 m)   Body mass index is 19.04 kg/m. Physical Exam Vitals reviewed.  Constitutional:      Appearance: Normal appearance.  HENT:     Head: Normocephalic.  Skin:    Comments: Some Erythema around the wound but not tender Leg edema is much better  Wound is dry  Neurological:     Mental Status: She is alert.  Psychiatric:        Mood and Affect: Mood normal.        Thought Content: Thought content normal.     Labs reviewed: Basic Metabolic Panel: Recent Labs    11/15/23 0850 06/13/24 1259 06/27/24 1204  NA 138 128* 134  K 3.8 3.6 3.8  CL 100 87* 95*  CO2 28 21 25   GLUCOSE 101* 138* 110*  BUN 17 13 16   CREATININE 0.90 0.87 0.95  CALCIUM 8.7 9.2 9.0  TSH  1.83  --   --    Liver Function Tests: Recent Labs    09/27/23 0830 11/15/23 0850 06/13/24 1259  AST 18 17 24   ALT 13 10 19   ALKPHOS  --   --  79  BILITOT 0.7 0.6 0.8  PROT 6.2 6.5 6.8  ALBUMIN  --   --  4.2   Recent Labs    09/27/23 0830  LIPASE 20   No results for input(s): AMMONIA in the last 8760 hours. CBC:  Recent Labs    09/27/23 0830 11/15/23 0850 06/13/24 1259  WBC 4.0 5.3 6.8  NEUTROABS 2,436 3,413  --   HGB 11.9 10.6* 11.5  HCT 34.9* 31.8* 34.1  MCV 88.6 88.1 88  PLT 193 225 217   Lipid Panel: Recent Labs    11/15/23 0850  CHOL 150  HDL 53  LDLCALC 79  TRIG 101  CHOLHDL 2.8   No results found for: HGBA1C  Procedures since last visit: No results found.  Assessment/Plan 1. Wound of right lower extremity, subsequent encounter (Primary) Continues to heal well Cover with Non Adhesive dressing  2. Bilateral leg edema Continue with Lasix  PRn    Labs/tests ordered:  * No order type specified * Next appt:  12/10/2024     [1]  Allergies Allergen Reactions   Fosamax [Alendronate Sodium]     Poor healing to mouth wound   Augmentin [Amoxicillin-Pot Clavulanate] Nausea Only    DID THE REACTION INVOLVE: Swelling of the face/tongue/throat, SOB, or low BP? No Sudden or severe rash/hives, skin peeling, or the inside of the mouth or nose? No Did it require medical treatment? No When did it last happen?       If all above answers are NO, may proceed with cephalosporin use.    Hydrocodone Nausea And Vomiting   Mobic [Meloxicam] Rash   Sulfonamide Derivatives Rash   "

## 2024-12-10 ENCOUNTER — Encounter: Admitting: Internal Medicine
